# Patient Record
Sex: Male | Born: 1940 | Race: White | Hispanic: No | Marital: Married
Health system: Southern US, Community
[De-identification: ages and names within clinical notes are randomized; demographics above are authoritative.]

## PROBLEM LIST (undated history)

## (undated) DIAGNOSIS — J90 Pleural effusion, not elsewhere classified: Secondary | ICD-10-CM

## (undated) DIAGNOSIS — I482 Chronic atrial fibrillation, unspecified: Secondary | ICD-10-CM

## (undated) DIAGNOSIS — G9341 Metabolic encephalopathy: Secondary | ICD-10-CM

## (undated) DIAGNOSIS — J9621 Acute and chronic respiratory failure with hypoxia: Secondary | ICD-10-CM

## (undated) DIAGNOSIS — I5042 Chronic combined systolic (congestive) and diastolic (congestive) heart failure: Secondary | ICD-10-CM

## (undated) DIAGNOSIS — J449 Chronic obstructive pulmonary disease, unspecified: Secondary | ICD-10-CM

---

## 2020-08-31 ENCOUNTER — Other Ambulatory Visit (HOSPITAL_COMMUNITY): Payer: Medicare Other

## 2020-08-31 ENCOUNTER — Inpatient Hospital Stay
Admission: AD | Admit: 2020-08-31 | Discharge: 2020-11-18 | Disposition: E | Payer: Medicare Other | Source: Other Acute Inpatient Hospital | Attending: Internal Medicine | Admitting: Internal Medicine

## 2020-08-31 DIAGNOSIS — N179 Acute kidney failure, unspecified: Secondary | ICD-10-CM

## 2020-08-31 DIAGNOSIS — K567 Ileus, unspecified: Secondary | ICD-10-CM

## 2020-08-31 DIAGNOSIS — J869 Pyothorax without fistula: Secondary | ICD-10-CM

## 2020-08-31 DIAGNOSIS — J9690 Respiratory failure, unspecified, unspecified whether with hypoxia or hypercapnia: Secondary | ICD-10-CM

## 2020-08-31 DIAGNOSIS — J449 Chronic obstructive pulmonary disease, unspecified: Secondary | ICD-10-CM | POA: Diagnosis present

## 2020-08-31 DIAGNOSIS — Z452 Encounter for adjustment and management of vascular access device: Secondary | ICD-10-CM

## 2020-08-31 DIAGNOSIS — Z9889 Other specified postprocedural states: Secondary | ICD-10-CM

## 2020-08-31 DIAGNOSIS — J9621 Acute and chronic respiratory failure with hypoxia: Secondary | ICD-10-CM | POA: Diagnosis present

## 2020-08-31 DIAGNOSIS — I5042 Chronic combined systolic (congestive) and diastolic (congestive) heart failure: Secondary | ICD-10-CM | POA: Diagnosis present

## 2020-08-31 DIAGNOSIS — J90 Pleural effusion, not elsewhere classified: Secondary | ICD-10-CM

## 2020-08-31 DIAGNOSIS — Z931 Gastrostomy status: Secondary | ICD-10-CM

## 2020-08-31 DIAGNOSIS — T859XXA Unspecified complication of internal prosthetic device, implant and graft, initial encounter: Secondary | ICD-10-CM

## 2020-08-31 DIAGNOSIS — G9341 Metabolic encephalopathy: Secondary | ICD-10-CM | POA: Diagnosis present

## 2020-08-31 DIAGNOSIS — T8132XA Disruption of internal operation (surgical) wound, not elsewhere classified, initial encounter: Secondary | ICD-10-CM

## 2020-08-31 DIAGNOSIS — I482 Chronic atrial fibrillation, unspecified: Secondary | ICD-10-CM | POA: Diagnosis present

## 2020-08-31 HISTORY — DX: Chronic obstructive pulmonary disease, unspecified: J44.9

## 2020-08-31 HISTORY — DX: Chronic atrial fibrillation, unspecified: I48.20

## 2020-08-31 HISTORY — DX: Acute and chronic respiratory failure with hypoxia: J96.21

## 2020-08-31 HISTORY — DX: Metabolic encephalopathy: G93.41

## 2020-08-31 HISTORY — DX: Chronic combined systolic (congestive) and diastolic (congestive) heart failure: I50.42

## 2020-08-31 HISTORY — DX: Pleural effusion, not elsewhere classified: J90

## 2020-08-31 LAB — BLOOD GAS, ARTERIAL
Acid-Base Excess: 4.4 mmol/L — ABNORMAL HIGH (ref 0.0–2.0)
Bicarbonate: 28.7 mmol/L — ABNORMAL HIGH (ref 20.0–28.0)
FIO2: 28
O2 Saturation: 96.9 %
Patient temperature: 37
pCO2 arterial: 44.9 mmHg (ref 32.0–48.0)
pH, Arterial: 7.422 (ref 7.350–7.450)
pO2, Arterial: 78.5 mmHg — ABNORMAL LOW (ref 83.0–108.0)

## 2020-09-01 DIAGNOSIS — I5042 Chronic combined systolic (congestive) and diastolic (congestive) heart failure: Secondary | ICD-10-CM | POA: Diagnosis not present

## 2020-09-01 DIAGNOSIS — G9341 Metabolic encephalopathy: Secondary | ICD-10-CM

## 2020-09-01 DIAGNOSIS — I482 Chronic atrial fibrillation, unspecified: Secondary | ICD-10-CM

## 2020-09-01 DIAGNOSIS — J449 Chronic obstructive pulmonary disease, unspecified: Secondary | ICD-10-CM | POA: Diagnosis not present

## 2020-09-01 DIAGNOSIS — J9621 Acute and chronic respiratory failure with hypoxia: Secondary | ICD-10-CM

## 2020-09-01 LAB — COMPREHENSIVE METABOLIC PANEL
ALT: 44 U/L (ref 0–44)
AST: 45 U/L — ABNORMAL HIGH (ref 15–41)
Albumin: 2.1 g/dL — ABNORMAL LOW (ref 3.5–5.0)
Alkaline Phosphatase: 106 U/L (ref 38–126)
Anion gap: 8 (ref 5–15)
BUN: 76 mg/dL — ABNORMAL HIGH (ref 8–23)
CO2: 28 mmol/L (ref 22–32)
Calcium: 9.1 mg/dL (ref 8.9–10.3)
Chloride: 110 mmol/L (ref 98–111)
Creatinine, Ser: 1.91 mg/dL — ABNORMAL HIGH (ref 0.61–1.24)
GFR calc Af Amer: 39 mL/min — ABNORMAL LOW (ref >60)
GFR calc non Af Amer: 33 mL/min — ABNORMAL LOW (ref >60)
Glucose, Bld: 179 mg/dL — ABNORMAL HIGH (ref 70–99)
Potassium: 5.8 mmol/L — ABNORMAL HIGH (ref 3.5–5.1)
Sodium: 146 mmol/L — ABNORMAL HIGH (ref 135–145)
Total Bilirubin: 1.1 mg/dL (ref 0.3–1.2)
Total Protein: 6.4 g/dL — ABNORMAL LOW (ref 6.5–8.1)

## 2020-09-01 LAB — CBC
HCT: 25.1 % — ABNORMAL LOW (ref 39.0–52.0)
Hemoglobin: 7.6 g/dL — ABNORMAL LOW (ref 13.0–17.0)
MCH: 28.7 pg (ref 26.0–34.0)
MCHC: 30.3 g/dL (ref 30.0–36.0)
MCV: 94.7 fL (ref 80.0–100.0)
Platelets: 232 10*3/uL (ref 150–400)
RBC: 2.65 MIL/uL — ABNORMAL LOW (ref 4.22–5.81)
RDW: 15.4 % (ref 11.5–15.5)
WBC: 7.7 10*3/uL (ref 4.0–10.5)
nRBC: 0 % (ref 0.0–0.2)

## 2020-09-01 LAB — PROTIME-INR
INR: 1.7 — ABNORMAL HIGH (ref 0.8–1.2)
Prothrombin Time: 19.6 seconds — ABNORMAL HIGH (ref 11.4–15.2)

## 2020-09-01 LAB — SARS CORONAVIRUS 2 (TAT 6-24 HRS): SARS Coronavirus 2: NEGATIVE

## 2020-09-01 NOTE — Consult Note (Signed)
Pulmonary Critical Care Medicine [facility] [place]  PULMONARY SERVICE  Date of Service: 09/01/2020  PULMONARY CRITICAL CARE CONSULT   Eric Knapp  HDQ:[id number]  DOB: 1941/09/17   DOA: 09/01/2020  Referring Physician: Carron Curie, MD  HPI: Eric Knapp is a 79 y.o. male seen for follow up of Acute on Chronic Respiratory Failure.  Patient has multiple medical problems including hypertension hyperlipidemia coronary artery disease chronic diastolic heart failure COPD diabetes mellitus chronic kidney disease stage III came into the hospital because of gallbladder issues.  Patient was found to be significantly dehydrated and was started on fluids and antibiotics because of sepsis.  Patient underwent an ERCP had gallstones underwent sphincterotomy and then placed a bile duct stent.  Hospital course was complicated with worsening of symptoms encephalopathy and respiratory failure patient ended up being intubated on the ventilator.  During the hospital stay patient apparently developed cardiac arrest and required CPR with return of circulation.  Subsequently failed to come off of the ventilator ended up having to have a tracheostomy transferred to our facility for further management  Past medical history: Hyperlipidemia Hypertension Coronary artery disease Chronic diastolic heart failure COPD Cholecystitis Diabetes mellitus  Past surgical history: Tracheostomy PEG ERCP Bile duct stent placement  Social history: Unknown  Family history: Noncontributory  Allergies: No known drug allergies   Review of Systems:  ROS performed and is unremarkable other than noted above.   Medications: Reviewed on Rounds  Physical Exam:  Vitals: Temperature is 96.8 pulse 69 respiratory rate 30 blood pressure is 123/43 saturations 92%  Ventilator Settings mode of ventilation pressure support FiO2 28% tidal volume 442 pressure 12/5   General: Comfortable at this time  Eyes: Grossly  normal lids, irises & conjunctiva  ENT: grossly tongue is normal  Neck: no obvious mass  Cardiovascular: S1-S2 normal no gallop or rub  Respiratory: Few coarse breath sounds with rhonchi  Abdomen: Soft and nontender  Skin: no rash seen on limited exam  Musculoskeletal: not rigid  Psychiatric:unable to assess  Neurologic: no seizure no involuntary movements         Labs on Admission:  Basic Metabolic Panel: Recent Labs  Lab 09/01/20 0955  NA 146*  K 5.8*  CL 110  CO2 28  GLUCOSE 179*  BUN 76*  CREATININE 1.91*  CALCIUM 9.1    Recent Labs  Lab 2020/09/01 1812  PHART 7.422  PCO2ART 44.9  PO2ART 78.5*  HCO3 28.7*  O2SAT 96.9    Liver Function Tests: Recent Labs  Lab 09/01/20 0955  AST 45*  ALT 44  ALKPHOS 106  BILITOT 1.1  PROT 6.4*  ALBUMIN 2.1*   No results for input(s): LIPASE, AMYLASE in the last 168 hours. No results for input(s): AMMONIA in the last 168 hours.  CBC: Recent Labs  Lab 09/01/20 0955  WBC 7.7  HGB 7.6*  HCT 25.1*  MCV 94.7  PLT 232    Cardiac Enzymes: No results for input(s): CKTOTAL, CKMB, CKMBINDEX, TROPONINI in the last 168 hours.  BNP (last 3 results) No results for input(s): BNP in the last 8760 hours.  ProBNP (last 3 results) No results for input(s): PROBNP in the last 8760 hours.   Radiological Exams on Admission: DG ABDOMEN PEG TUBE LOCATION  Result Date: 09-01-2020 CLINICAL DATA:  Peg tube placement. EXAM: ABDOMEN - 1 VIEW COMPARISON:  None. FINDINGS: The bowel gas pattern is normal. Distal tip of PEG tube appears to be within the gastric lumen. Normal contrast filling of the  gastric lumen and proximal duodenum is noted. No extravasation or leakage is noted. No radio-opaque calculi or other significant radiographic abnormality are seen. Common bile duct stent is noted. Pigtail catheter is noted in right upper quadrant. IMPRESSION: Distal tip of PEG tube appears to be within the gastric lumen. No evidence of  extravasation or leakage. No evidence of bowel obstruction or ileus. Electronically Signed   By: Lupita Raider M.D.   On: 2020-09-10 19:02   DG CHEST PORT 1 VIEW  Result Date: 09-10-20 CLINICAL DATA:  Respiratory failure. EXAM: PORTABLE CHEST 1 VIEW COMPARISON:  None. FINDINGS: Mild cardiomegaly is noted. Tracheostomy tube is in good position. Large right pleural effusion is noted with aerated right upper lobe, but probable right lower lobe atelectasis. No pneumothorax is noted. Left lung is unremarkable. Bony thorax appears normal. IMPRESSION: Large right pleural effusion is noted with aerated right upper lobe, but probable right lower lobe atelectasis. Electronically Signed   By: Lupita Raider M.D.   On: 2020/09/10 19:01    Assessment/Plan Active Problems:   Acute on chronic respiratory failure with hypoxia (HCC)   Chronic combined systolic and diastolic heart failure (HCC)   COPD, severe (HCC)   Chronic atrial fibrillation (HCC)   Metabolic encephalopathy   1. Acute on chronic respiratory failure with hypoxia patient continues to be on the weaning protocol we will continue to try to advance as tolerated.  So far patient is doing well we will continue with supportive care.  Titrate oxygen continue pulmonary toilet. 2. Chronic heart failure combined systolic diastolic supportive care patient has been on beta-blockers 3. COPD nebulizers as necessary we will continue to follow. 4. Chronic atrial fibrillation rate controlled at this time we will continue to monitor closely. 5. Metabolic encephalopathy supportive care  I have personally seen and evaluated the patient, evaluated laboratory and imaging results, formulated the assessment and plan and placed orders. The Patient requires high complexity decision making with multiple systems involvement.  Case was discussed on Rounds with the Respiratory Therapy Director and the Respiratory staff Time Spent  Yevonne Pax, MD  [facility] Pulmonary Critical Care Medicine Sleep Medicine

## 2020-09-02 ENCOUNTER — Other Ambulatory Visit (HOSPITAL_COMMUNITY): Payer: Medicare Other

## 2020-09-02 DIAGNOSIS — J449 Chronic obstructive pulmonary disease, unspecified: Secondary | ICD-10-CM | POA: Diagnosis not present

## 2020-09-02 DIAGNOSIS — I482 Chronic atrial fibrillation, unspecified: Secondary | ICD-10-CM | POA: Diagnosis not present

## 2020-09-02 DIAGNOSIS — I5042 Chronic combined systolic (congestive) and diastolic (congestive) heart failure: Secondary | ICD-10-CM | POA: Diagnosis not present

## 2020-09-02 DIAGNOSIS — J9621 Acute and chronic respiratory failure with hypoxia: Secondary | ICD-10-CM | POA: Diagnosis not present

## 2020-09-02 HISTORY — PX: IR THORACENTESIS ASP PLEURAL SPACE W/IMG GUIDE: IMG5380

## 2020-09-02 MED ORDER — LIDOCAINE HCL (PF) 1 % IJ SOLN
INTRAMUSCULAR | Status: AC | PRN
  Administered 2020-09-02: 20 mL

## 2020-09-02 MED ORDER — LIDOCAINE HCL 1 % IJ SOLN
INTRAMUSCULAR | Status: AC
  Filled 2020-09-02: qty 20

## 2020-09-02 NOTE — Procedures (Signed)
PROCEDURE SUMMARY:  Successful image-guided right thoracentesis. Yielded 2.0 liters of dark red fluid. Procedure was stopped after 2.0 L per IR protocol as this is patient's first thoracentesis. Patient tolerated procedure well. No immediate complications. EBL < 5 mL.  Specimen was not sent for labs. CXR ordered.  Please see imaging section of Epic for full dictation.   Elwin Mocha PA-C 09/02/2020 3:33 PM

## 2020-09-02 NOTE — Progress Notes (Addendum)
Pulmonary Critical Care Medicine [facility] [place]   PULMONARY CRITICAL CARE SERVICE  PROGRESS NOTE  Date of Service: 09/02/2020  Eric Knapp  ZYS:[id number]  DOB: 18-Sep-1941   DOA: 08/30/2020  Referring Physician: Carron Curie, MD  HPI: Eric Knapp is a 79 y.o. male seen for follow up of Acute on Chronic Respiratory Failure.   Patient has an 8 hour goal today on pressure support currently on 28% FiO2 satting well no fever distress.  Medications: Reviewed on Rounds  Physical Exam:  Vitals:   Pulse 97 respirations 30 BP 113/62 O2 sat 98% temp 97.2 degrees  Ventilator Settings  Pressure support 12/5 FiO2 28%  . General: Comfortable at this time . Eyes: Grossly normal lids, irises & conjunctiva . ENT: grossly tongue is normal . Neck: no obvious mass . Cardiovascular: S1 S2 normal no gallop . Respiratory:  Coarse breath sounds . Abdomen: soft . Skin: no rash seen on limited exam . Musculoskeletal: not rigid . Psychiatric:unable to assess . Neurologic: no seizure no involuntary movements         Lab Data:   Basic Metabolic Panel: Recent Labs  Lab 09/01/20 0955  NA 146*  K 5.8*  CL 110  CO2 28  GLUCOSE 179*  BUN 76*  CREATININE 1.91*  CALCIUM 9.1    ABG: Recent Labs  Lab 08/28/2020 1812  PHART 7.422  PCO2ART 44.9  PO2ART 78.5*  HCO3 28.7*  O2SAT 96.9    Liver Function Tests: Recent Labs  Lab 09/01/20 0955  AST 45*  ALT 44  ALKPHOS 106  BILITOT 1.1  PROT 6.4*  ALBUMIN 2.1*   No results for input(s): LIPASE, AMYLASE in the last 168 hours. No results for input(s): AMMONIA in the last 168 hours.  CBC: Recent Labs  Lab 09/01/20 0955  WBC 7.7  HGB 7.6*  HCT 25.1*  MCV 94.7  PLT 232    Cardiac Enzymes: No results for input(s): CKTOTAL, CKMB, CKMBINDEX, TROPONINI in the last 168 hours.  BNP (last 3 results) No results for input(s): BNP in the last 8760 hours.  ProBNP (last 3 results) No results for input(s): PROBNP in the last  8760 hours.  Radiological Exams: DG Chest Port 1 View  Result Date: 09/02/2020 CLINICAL DATA:  Status post right thoracentesis yielding 2 L EXAM: PORTABLE CHEST 1 VIEW COMPARISON:  Chest x-ray 08/21/2020 FINDINGS: Tracheostomy tube with tip terminating approximately 6 cm above the carina. Persistent enlarged cardiac silhouette. Aortic arch calcifications. Interval decrease in size of a persistent moderate sized right pleural effusion. Improved aeration of the right lung with probable right lower lobe atelectasis. No focal consolidation identified. No pulmonary edema. No left pleural effusion. No focal consolidation within the left lung. No pneumothorax. No acute osseous abnormality. IMPRESSION: 1. Interval decrease in size of a moderate right pleural effusion with improved aeration of the right lung. Likely underlying atelectasis. Focal consolidation cannot be excluded. 2. Cardiomegaly. Electronically Signed   By: Tish Frederickson M.D.   On: 09/02/2020 16:16   IR THORACENTESIS ASP PLEURAL SPACE W/IMG GUIDE  Result Date: 09/02/2020 INDICATION: Patient with history of HF, CAD, anoxic brain injury, and bilateral pleural effusions, right > left. Request is made for therapeutic right thoracentesis. EXAM: ULTRASOUND GUIDED THERAPEUTIC RIGHT THORACENTESIS MEDICATIONS: 18 mL 1% lidocaine COMPLICATIONS: None immediate. PROCEDURE: An ultrasound guided thoracentesis was thoroughly discussed with the patient and questions answered. The benefits, risks, alternatives and complications were also discussed. The patient understands and wishes to proceed with the procedure. Written  consent was obtained. Ultrasound was performed to localize and mark an adequate pocket of fluid in the right chest. The area was then prepped and draped in the normal sterile fashion. 1% Lidocaine was used for local anesthesia. Under ultrasound guidance a 6 Fr Safe-T-Centesis catheter was introduced. Thoracentesis was performed. The catheter was  removed and a dressing applied. FINDINGS: A total of approximately 2 L of dark red fluid was removed. Procedure was stopped after 2 L per IR protocol as this was patient's first thoracentesis. IMPRESSION: Successful ultrasound guided right thoracentesis yielding 2 L of pleural fluid. Read by: Elwin Mocha, PA-C Electronically Signed   By: Marliss Coots MD   On: 09/02/2020 16:26    Assessment/Plan Active Problems:   Acute on chronic respiratory failure with hypoxia (HCC)   Chronic combined systolic and diastolic heart failure (HCC)   COPD, severe (HCC)   Chronic atrial fibrillation (HCC)   Metabolic encephalopathy   1. Acute on chronic respiratory failure with hypoxia  Continue to wean for goal of 8 hours on pressure support today will continue supportive measures and pulmonary toilet. 2. Chronic heart failure combined systolic diastolic supportive care patient has been on beta-blockers 3. COPD nebulizers as necessary we will continue to follow. 4. Chronic atrial fibrillation rate controlled at this time we will continue to monitor closely. 5. Metabolic encephalopathy supportive care   I have personally seen and evaluated the patient, evaluated laboratory and imaging results, formulated the assessment and plan and placed orders. The Patient requires high complexity decision making with multiple systems involvement.  Rounds were done with the Respiratory Therapy Director and Staff therapists and discussed with nursing staff also.  Yevonne Pax, MD [facility] Pulmonary Critical Care Medicine Sleep Medicine

## 2020-09-03 ENCOUNTER — Encounter: Payer: Self-pay | Admitting: Internal Medicine

## 2020-09-03 DIAGNOSIS — I482 Chronic atrial fibrillation, unspecified: Secondary | ICD-10-CM | POA: Diagnosis present

## 2020-09-03 DIAGNOSIS — G9341 Metabolic encephalopathy: Secondary | ICD-10-CM | POA: Diagnosis present

## 2020-09-03 DIAGNOSIS — I5042 Chronic combined systolic (congestive) and diastolic (congestive) heart failure: Secondary | ICD-10-CM | POA: Diagnosis present

## 2020-09-03 DIAGNOSIS — J9621 Acute and chronic respiratory failure with hypoxia: Secondary | ICD-10-CM | POA: Diagnosis present

## 2020-09-03 DIAGNOSIS — J449 Chronic obstructive pulmonary disease, unspecified: Secondary | ICD-10-CM | POA: Diagnosis not present

## 2020-09-03 NOTE — Progress Notes (Addendum)
Pulmonary Critical Care Medicine [facility] [place]   PULMONARY CRITICAL CARE SERVICE  PROGRESS NOTE  Date of Service: 09/03/2020  Eric Knapp  UEA:[id number]  DOB: 1941/10/20   DOA: 09-10-20  Referring Physician: Carron Curie, MD  HPI: Eric Knapp is a 79 y.o. male seen for follow up of Acute on Chronic Respiratory Failure.   Patient continues on pressure support with FiO2 28% currently satting well no distress.  Medications: Reviewed on Rounds  Physical Exam:  Vitals:   Pulse 82 respirations 19 BP 120/69 O2 sat 100% temp 96.0 degrees  Ventilator Settings  Pressure support 12/5 FiO2 28%  . General: Comfortable at this time . Eyes: Grossly normal lids, irises & conjunctiva . ENT: grossly tongue is normal . Neck: no obvious mass . Cardiovascular: S1 S2 normal no gallop . Respiratory:  Coarse breath sounds . Abdomen: soft . Skin: no rash seen on limited exam . Musculoskeletal: not rigid . Psychiatric:unable to assess . Neurologic: no seizure no involuntary movements         Lab Data:   Basic Metabolic Panel: Recent Labs  Lab 09/01/20 0955  NA 146*  K 5.8*  CL 110  CO2 28  GLUCOSE 179*  BUN 76*  CREATININE 1.91*  CALCIUM 9.1    ABG: Recent Labs  Lab 09-10-2020 1812  PHART 7.422  PCO2ART 44.9  PO2ART 78.5*  HCO3 28.7*  O2SAT 96.9    Liver Function Tests: Recent Labs  Lab 09/01/20 0955  AST 45*  ALT 44  ALKPHOS 106  BILITOT 1.1  PROT 6.4*  ALBUMIN 2.1*   No results for input(s): LIPASE, AMYLASE in the last 168 hours. No results for input(s): AMMONIA in the last 168 hours.  CBC: Recent Labs  Lab 09/01/20 0955  WBC 7.7  HGB 7.6*  HCT 25.1*  MCV 94.7  PLT 232    Cardiac Enzymes: No results for input(s): CKTOTAL, CKMB, CKMBINDEX, TROPONINI in the last 168 hours.  BNP (last 3 results) No results for input(s): BNP in the last 8760 hours.  ProBNP (last 3 results) No results for input(s): PROBNP in the last 8760  hours.  Radiological Exams: DG Chest Port 1 View  Result Date: 09/02/2020 CLINICAL DATA:  Status post right thoracentesis yielding 2 L EXAM: PORTABLE CHEST 1 VIEW COMPARISON:  Chest x-ray 09-10-20 FINDINGS: Tracheostomy tube with tip terminating approximately 6 cm above the carina. Persistent enlarged cardiac silhouette. Aortic arch calcifications. Interval decrease in size of a persistent moderate sized right pleural effusion. Improved aeration of the right lung with probable right lower lobe atelectasis. No focal consolidation identified. No pulmonary edema. No left pleural effusion. No focal consolidation within the left lung. No pneumothorax. No acute osseous abnormality. IMPRESSION: 1. Interval decrease in size of a moderate right pleural effusion with improved aeration of the right lung. Likely underlying atelectasis. Focal consolidation cannot be excluded. 2. Cardiomegaly. Electronically Signed   By: Tish Frederickson M.D.   On: 09/02/2020 16:16   IR THORACENTESIS ASP PLEURAL SPACE W/IMG GUIDE  Result Date: 09/02/2020 INDICATION: Patient with history of HF, CAD, anoxic brain injury, and bilateral pleural effusions, right > left. Request is made for therapeutic right thoracentesis. EXAM: ULTRASOUND GUIDED THERAPEUTIC RIGHT THORACENTESIS MEDICATIONS: 18 mL 1% lidocaine COMPLICATIONS: None immediate. PROCEDURE: An ultrasound guided thoracentesis was thoroughly discussed with the patient and questions answered. The benefits, risks, alternatives and complications were also discussed. The patient understands and wishes to proceed with the procedure. Written consent was obtained. Ultrasound was performed  to localize and mark an adequate pocket of fluid in the right chest. The area was then prepped and draped in the normal sterile fashion. 1% Lidocaine was used for local anesthesia. Under ultrasound guidance a 6 Fr Safe-T-Centesis catheter was introduced. Thoracentesis was performed. The catheter was removed  and a dressing applied. FINDINGS: A total of approximately 2 L of dark red fluid was removed. Procedure was stopped after 2 L per IR protocol as this was patient's first thoracentesis. IMPRESSION: Successful ultrasound guided right thoracentesis yielding 2 L of pleural fluid. Read by: Elwin Mocha, PA-C Electronically Signed   By: Marliss Coots MD   On: 09/02/2020 16:26    Assessment/Plan Active Problems:   Acute on chronic respiratory failure with hypoxia (HCC)   Chronic combined systolic and diastolic heart failure (HCC)   COPD, severe (HCC)   Chronic atrial fibrillation (HCC)   Metabolic encephalopathy   1. Acute on chronic respiratory failure with hypoxia  Continue wean on pressure support at this time.  Continue supportive measures and pulmonary toilet. 2. Chronic heart failure combined systolic diastolic supportive care patient has been on beta-blockers 3. COPD nebulizers as necessary we will continue to follow. 4. Chronic atrial fibrillation rate controlled at this time we will continue to monitor closely. 5. Metabolic encephalopathy supportive care   I have personally seen and evaluated the patient, evaluated laboratory and imaging results, formulated the assessment and plan and placed orders. The Patient requires high complexity decision making with multiple systems involvement.  Rounds were done with the Respiratory Therapy Director and Staff therapists and discussed with nursing staff also.  Yevonne Pax, MD [facility] Pulmonary Critical Care Medicine Sleep Medicine

## 2020-09-04 DIAGNOSIS — I5042 Chronic combined systolic (congestive) and diastolic (congestive) heart failure: Secondary | ICD-10-CM | POA: Diagnosis not present

## 2020-09-04 DIAGNOSIS — J449 Chronic obstructive pulmonary disease, unspecified: Secondary | ICD-10-CM | POA: Diagnosis not present

## 2020-09-04 DIAGNOSIS — I482 Chronic atrial fibrillation, unspecified: Secondary | ICD-10-CM | POA: Diagnosis not present

## 2020-09-04 DIAGNOSIS — J9621 Acute and chronic respiratory failure with hypoxia: Secondary | ICD-10-CM | POA: Diagnosis not present

## 2020-09-04 LAB — CBC
HCT: 22.7 % — ABNORMAL LOW (ref 39.0–52.0)
HCT: 26.8 % — ABNORMAL LOW (ref 39.0–52.0)
Hemoglobin: 6.5 g/dL — CL (ref 13.0–17.0)
Hemoglobin: 8.2 g/dL — ABNORMAL LOW (ref 13.0–17.0)
MCH: 27.1 pg (ref 26.0–34.0)
MCH: 28.9 pg (ref 26.0–34.0)
MCHC: 28.6 g/dL — ABNORMAL LOW (ref 30.0–36.0)
MCHC: 30.6 g/dL (ref 30.0–36.0)
MCV: 94.6 fL (ref 80.0–100.0)
MCV: 94.4 fL (ref 80.0–100.0)
Platelets: 200 10*3/uL (ref 150–400)
Platelets: 221 10*3/uL (ref 150–400)
RBC: 2.4 MIL/uL — ABNORMAL LOW (ref 4.22–5.81)
RBC: 2.84 MIL/uL — ABNORMAL LOW (ref 4.22–5.81)
RDW: 15.4 % (ref 11.5–15.5)
RDW: 15.1 % (ref 11.5–15.5)
WBC: 6.9 10*3/uL (ref 4.0–10.5)
WBC: 6.7 10*3/uL (ref 4.0–10.5)
nRBC: 0 % (ref 0.0–0.2)
nRBC: 0 % (ref 0.0–0.2)

## 2020-09-04 LAB — BASIC METABOLIC PANEL
Anion gap: 3 — ABNORMAL LOW (ref 5–15)
Anion gap: 7 (ref 5–15)
BUN: 107 mg/dL — ABNORMAL HIGH (ref 8–23)
BUN: 106 mg/dL — ABNORMAL HIGH (ref 8–23)
CO2: 29 mmol/L (ref 22–32)
CO2: 28 mmol/L (ref 22–32)
Calcium: 8.6 mg/dL — ABNORMAL LOW (ref 8.9–10.3)
Calcium: 8.8 mg/dL — ABNORMAL LOW (ref 8.9–10.3)
Chloride: 113 mmol/L — ABNORMAL HIGH (ref 98–111)
Chloride: 113 mmol/L — ABNORMAL HIGH (ref 98–111)
Creatinine, Ser: 2.07 mg/dL — ABNORMAL HIGH (ref 0.61–1.24)
Creatinine, Ser: 1.97 mg/dL — ABNORMAL HIGH (ref 0.61–1.24)
GFR calc Af Amer: 35 mL/min — ABNORMAL LOW (ref >60)
GFR calc Af Amer: 37 mL/min — ABNORMAL LOW (ref >60)
GFR calc non Af Amer: 30 mL/min — ABNORMAL LOW (ref >60)
GFR calc non Af Amer: 32 mL/min — ABNORMAL LOW (ref >60)
Glucose, Bld: 114 mg/dL — ABNORMAL HIGH (ref 70–99)
Glucose, Bld: 157 mg/dL — ABNORMAL HIGH (ref 70–99)
Potassium: 5.4 mmol/L — ABNORMAL HIGH (ref 3.5–5.1)
Potassium: 5.4 mmol/L — ABNORMAL HIGH (ref 3.5–5.1)
Sodium: 145 mmol/L (ref 135–145)
Sodium: 148 mmol/L — ABNORMAL HIGH (ref 135–145)

## 2020-09-04 LAB — ABO/RH: ABO/RH(D): O POS

## 2020-09-04 LAB — PREPARE RBC (CROSSMATCH)

## 2020-09-04 LAB — BRAIN NATRIURETIC PEPTIDE: B Natriuretic Peptide: 389.9 pg/mL — ABNORMAL HIGH (ref 0.0–100.0)

## 2020-09-04 NOTE — Progress Notes (Addendum)
Pulmonary Critical Care Medicine [facility] [place]   PULMONARY CRITICAL CARE SERVICE  PROGRESS NOTE  Date of Service: 09/04/2020  Eric Knapp  IEP:[id number]  DOB: [birth year]   DOA: 09/06/2020  Referring Physician: Carron Curie, MD  HPI: Eric Knapp is a 79 y.o. male seen for follow up of Acute on Chronic Respiratory Failure.  Patient mains on pressure support at this time currently on 20% FiO2 satting well no fever distress.  Medications: Reviewed on Rounds  Physical Exam:  Vitals: Pulse 87 respirations 16 BP 116/56 O2 sat 100% temp 97.1  Ventilator Settings pressure support 12/5 FiO2 28%  . General: Comfortable at this time . Eyes: Grossly normal lids, irises & conjunctiva . ENT: grossly tongue is normal . Neck: no obvious mass . Cardiovascular: S1 S2 normal no gallop . Respiratory: Coarse breath sounds . Abdomen: soft . Skin: no rash seen on limited exam . Musculoskeletal: not rigid . Psychiatric:unable to assess . Neurologic: no seizure no involuntary movements         Lab Data:   Basic Metabolic Panel: Recent Labs  Lab 09/01/20 0955 09/04/20 0632  NA 146* 145  K 5.8* 5.4*  CL 110 113*  CO2 28 29  GLUCOSE 179* 114*  BUN 76* 107*  CREATININE 1.91* 2.07*  CALCIUM 9.1 8.6*    ABG: Recent Labs  Lab 09/06/2020 1812  PHART 7.422  PCO2ART 44.9  PO2ART 78.5*  HCO3 28.7*  O2SAT 96.9    Liver Function Tests: Recent Labs  Lab 09/01/20 0955  AST 45*  ALT 44  ALKPHOS 106  BILITOT 1.1  PROT 6.4*  ALBUMIN 2.1*   No results for input(s): LIPASE, AMYLASE in the last 168 hours. No results for input(s): AMMONIA in the last 168 hours.  CBC: Recent Labs  Lab 09/01/20 0955 09/04/20 0632  WBC 7.7 6.9  HGB 7.6* 6.5*  HCT 25.1* 22.7*  MCV 94.7 94.6  PLT 232 200    Cardiac Enzymes: No results for input(s): CKTOTAL, CKMB, CKMBINDEX, TROPONINI in the last 168 hours.  BNP (last 3 results) No results for input(s): BNP in the last 8760  hours.  ProBNP (last 3 results) No results for input(s): PROBNP in the last 8760 hours.  Radiological Exams: DG Chest Port 1 View  Result Date: 09/02/2020 CLINICAL DATA:  Status post right thoracentesis yielding 2 L EXAM: PORTABLE CHEST 1 VIEW COMPARISON:  Chest x-ray 09/17/2020 FINDINGS: Tracheostomy tube with tip terminating approximately 6 cm above the carina. Persistent enlarged cardiac silhouette. Aortic arch calcifications. Interval decrease in size of a persistent moderate sized right pleural effusion. Improved aeration of the right lung with probable right lower lobe atelectasis. No focal consolidation identified. No pulmonary edema. No left pleural effusion. No focal consolidation within the left lung. No pneumothorax. No acute osseous abnormality. IMPRESSION: 1. Interval decrease in size of a moderate right pleural effusion with improved aeration of the right lung. Likely underlying atelectasis. Focal consolidation cannot be excluded. 2. Cardiomegaly. Electronically Signed   By: Tish Frederickson M.D.   On: 09/02/2020 16:16   IR THORACENTESIS ASP PLEURAL SPACE W/IMG GUIDE  Result Date: 09/02/2020 INDICATION: Patient with history of HF, CAD, anoxic brain injury, and bilateral pleural effusions, right > left. Request is made for therapeutic right thoracentesis. EXAM: ULTRASOUND GUIDED THERAPEUTIC RIGHT THORACENTESIS MEDICATIONS: 18 mL 1% lidocaine COMPLICATIONS: None immediate. PROCEDURE: An ultrasound guided thoracentesis was thoroughly discussed with the patient and questions answered. The benefits, risks, alternatives and complications were also discussed. The patient  understands and wishes to proceed with the procedure. Written consent was obtained. Ultrasound was performed to localize and mark an adequate pocket of fluid in the right chest. The area was then prepped and draped in the normal sterile fashion. 1% Lidocaine was used for local anesthesia. Under ultrasound guidance a 6 Fr  Safe-T-Centesis catheter was introduced. Thoracentesis was performed. The catheter was removed and a dressing applied. FINDINGS: A total of approximately 2 L of dark red fluid was removed. Procedure was stopped after 2 L per IR protocol as this was patient's first thoracentesis. IMPRESSION: Successful ultrasound guided right thoracentesis yielding 2 L of pleural fluid. Read by: Elwin Mocha, PA-C Electronically Signed   By: Marliss Coots MD   On: 09/02/2020 16:26    Assessment/Plan Active Problems:   Acute on chronic respiratory failure with hypoxia (HCC)   Chronic combined systolic and diastolic heart failure (HCC)   COPD, severe (HCC)   Chronic atrial fibrillation (HCC)   Metabolic encephalopathy   1. Acute on chronic respiratory failure with hypoxia  Continue wean on pressure support at this time.  Continue supportive measures and pulmonary toilet. 2. Chronic heart failure combined systolic diastolic supportive care patient has been on beta-blockers 3. COPD nebulizers as necessary we will continue to follow. 4. Chronic atrial fibrillation rate controlled at this time we will continue to monitor closely. 5. Metabolic encephalopathy supportive care   I have personally seen and evaluated the patient, evaluated laboratory and imaging results, formulated the assessment and plan and placed orders. The Patient requires high complexity decision making with multiple systems involvement.  Rounds were done with the Respiratory Therapy Director and Staff therapists and discussed with nursing staff also.  Yevonne Pax, MD [facility] Pulmonary Critical Care Medicine Sleep Medicine

## 2020-09-05 ENCOUNTER — Other Ambulatory Visit (HOSPITAL_COMMUNITY): Payer: Medicare Other

## 2020-09-05 DIAGNOSIS — J449 Chronic obstructive pulmonary disease, unspecified: Secondary | ICD-10-CM | POA: Diagnosis not present

## 2020-09-05 DIAGNOSIS — J9621 Acute and chronic respiratory failure with hypoxia: Secondary | ICD-10-CM | POA: Diagnosis not present

## 2020-09-05 DIAGNOSIS — I482 Chronic atrial fibrillation, unspecified: Secondary | ICD-10-CM | POA: Diagnosis not present

## 2020-09-05 DIAGNOSIS — I5042 Chronic combined systolic (congestive) and diastolic (congestive) heart failure: Secondary | ICD-10-CM | POA: Diagnosis not present

## 2020-09-05 LAB — URINALYSIS, ROUTINE W REFLEX MICROSCOPIC
Bilirubin Urine: NEGATIVE
Glucose, UA: NEGATIVE mg/dL
Ketones, ur: NEGATIVE mg/dL
Nitrite: NEGATIVE
Protein, ur: 100 mg/dL — AB
RBC / HPF: >50 RBC/hpf — ABNORMAL HIGH (ref 0–5)
Specific Gravity, Urine: 1.012 (ref 1.005–1.030)
WBC, UA: >50 WBC/hpf — ABNORMAL HIGH (ref 0–5)
pH: 5 (ref 5.0–8.0)

## 2020-09-05 LAB — CBC
HCT: 29.7 % — ABNORMAL LOW (ref 39.0–52.0)
Hemoglobin: 9 g/dL — ABNORMAL LOW (ref 13.0–17.0)
MCH: 28.4 pg (ref 26.0–34.0)
MCHC: 30.3 g/dL (ref 30.0–36.0)
MCV: 93.7 fL (ref 80.0–100.0)
Platelets: 245 10*3/uL (ref 150–400)
RBC: 3.17 MIL/uL — ABNORMAL LOW (ref 4.22–5.81)
RDW: 15.4 % (ref 11.5–15.5)
WBC: 9.6 10*3/uL (ref 4.0–10.5)
nRBC: 0 % (ref 0.0–0.2)

## 2020-09-05 LAB — BASIC METABOLIC PANEL
Anion gap: 8 (ref 5–15)
Anion gap: 11 (ref 5–15)
BUN: 109 mg/dL — ABNORMAL HIGH (ref 8–23)
BUN: 117 mg/dL — ABNORMAL HIGH (ref 8–23)
CO2: 27 mmol/L (ref 22–32)
CO2: 26 mmol/L (ref 22–32)
Calcium: 8.6 mg/dL — ABNORMAL LOW (ref 8.9–10.3)
Calcium: 8.5 mg/dL — ABNORMAL LOW (ref 8.9–10.3)
Chloride: 113 mmol/L — ABNORMAL HIGH (ref 98–111)
Chloride: 111 mmol/L (ref 98–111)
Creatinine, Ser: 2.06 mg/dL — ABNORMAL HIGH (ref 0.61–1.24)
Creatinine, Ser: 2.33 mg/dL — ABNORMAL HIGH (ref 0.61–1.24)
GFR calc Af Amer: 35 mL/min — ABNORMAL LOW (ref >60)
GFR calc Af Amer: 30 mL/min — ABNORMAL LOW (ref >60)
GFR calc non Af Amer: 30 mL/min — ABNORMAL LOW (ref >60)
GFR calc non Af Amer: 26 mL/min — ABNORMAL LOW (ref >60)
Glucose, Bld: 197 mg/dL — ABNORMAL HIGH (ref 70–99)
Glucose, Bld: 155 mg/dL — ABNORMAL HIGH (ref 70–99)
Potassium: 6.1 mmol/L — ABNORMAL HIGH (ref 3.5–5.1)
Potassium: 5.2 mmol/L — ABNORMAL HIGH (ref 3.5–5.1)
Sodium: 148 mmol/L — ABNORMAL HIGH (ref 135–145)
Sodium: 148 mmol/L — ABNORMAL HIGH (ref 135–145)

## 2020-09-05 LAB — BLOOD GAS, ARTERIAL
Acid-Base Excess: 4.4 mmol/L — ABNORMAL HIGH (ref 0.0–2.0)
Bicarbonate: 28 mmol/L (ref 20.0–28.0)
FIO2: 28
O2 Saturation: 93.9 %
Patient temperature: 37
pCO2 arterial: 38.6 mmHg (ref 32.0–48.0)
pH, Arterial: 7.474 — ABNORMAL HIGH (ref 7.350–7.450)
pO2, Arterial: 64.9 mmHg — ABNORMAL LOW (ref 83.0–108.0)

## 2020-09-05 LAB — MAGNESIUM: Magnesium: 1.9 mg/dL (ref 1.7–2.4)

## 2020-09-05 LAB — BRAIN NATRIURETIC PEPTIDE: B Natriuretic Peptide: 458.9 pg/mL — ABNORMAL HIGH (ref 0.0–100.0)

## 2020-09-05 LAB — TROPONIN I (HIGH SENSITIVITY): Troponin I (High Sensitivity): 21 ng/L — ABNORMAL HIGH (ref <18)

## 2020-09-05 NOTE — Progress Notes (Addendum)
Pulmonary Critical Care Medicine [facility] [place]   PULMONARY CRITICAL CARE SERVICE  PROGRESS NOTE  Date of Service: 09/05/2020  Eric Knapp  DZH:[id number]  DOB: May 10, 1941   DOA: 2020-09-17  Referring Physician: Carron Curie, MD  HPI: Eric Knapp is a 79 y.o. male seen for follow up of Acute on Chronic Respiratory Failure.  Patient mains on full support assist-control mode rate of 10 with an FiO2 of 50% at this time satting well no fever distress.   Medications: Reviewed on Rounds  Physical Exam:  Vitals: Pulse 97 respiration 24 BP 137/73 O2 sat 98% temp 97.9   Ventilator Settings ventilator mode AC PC rate of 10 expiratory pressure 25 PEEP of 5 and FiO2 50%  . General: Comfortable at this time . Eyes: Grossly normal lids, irises & conjunctiva . ENT: grossly tongue is normal . Neck: no obvious mass . Cardiovascular: S1 S2 normal no gallop . Respiratory: Coarse breath sounds . Abdomen: soft . Skin: no rash seen on limited exam . Musculoskeletal: not rigid . Psychiatric:unable to assess . Neurologic: no seizure no involuntary movements         Lab Data:   Basic Metabolic Panel: Recent Labs  Lab 09/01/20 0955 09/04/20 0632 09/04/20 1646 09/05/20 1007  NA 146* 145 148* 148*  K 5.8* 5.4* 5.4* 6.1*  CL 110 113* 113* 113*  CO2 28 29 28 27   GLUCOSE 179* 114* 157* 197*  BUN 76* 107* 106* 109*  CREATININE 1.91* 2.07* 1.97* 2.06*  CALCIUM 9.1 8.6* 8.8* 8.6*  MG  --   --   --  1.9    ABG: Recent Labs  Lab 2020/09/17 1812 09/05/20 0750  PHART 7.422 7.474*  PCO2ART 44.9 38.6  PO2ART 78.5* 64.9*  HCO3 28.7* 28.0  O2SAT 96.9 93.9    Liver Function Tests: Recent Labs  Lab 09/01/20 0955  AST 45*  ALT 44  ALKPHOS 106  BILITOT 1.1  PROT 6.4*  ALBUMIN 2.1*   No results for input(s): LIPASE, AMYLASE in the last 168 hours. No results for input(s): AMMONIA in the last 168 hours.  CBC: Recent Labs  Lab 09/01/20 0955 09/04/20 0632 09/04/20 1646  09/05/20 0717  WBC 7.7 6.9 6.7 9.6  HGB 7.6* 6.5* 8.2* 9.0*  HCT 25.1* 22.7* 26.8* 29.7*  MCV 94.7 94.6 94.4 93.7  PLT 232 200 221 245    Cardiac Enzymes: No results for input(s): CKTOTAL, CKMB, CKMBINDEX, TROPONINI in the last 168 hours.  BNP (last 3 results) Recent Labs    09/04/20 1646 09/05/20 1007  BNP 389.9* 458.9*    ProBNP (last 3 results) No results for input(s): PROBNP in the last 8760 hours.  Radiological Exams: DG Chest Port 1 View  Result Date: 09/05/2020 CLINICAL DATA:  Evaluate tracheostomy tube position. EXAM: PORTABLE CHEST 1 VIEW COMPARISON:  09/02/2020 FINDINGS: The tracheostomy tube tip is stable above the carina. Unchanged cardiomediastinal contours. Moderate right pleural effusion is identified. Unchanged veil like opacification of the entire right lung. Left lung appears clear. IMPRESSION: 1. Stable tracheostomy tube position. 2. Persistent right pleural effusion and with veil like opacification of the entire right lung. Electronically Signed   By: 09/04/2020 M.D.   On: 09/05/2020 08:56    Assessment/Plan Active Problems:   Acute on chronic respiratory failure with hypoxia (HCC)   Chronic combined systolic and diastolic heart failure (HCC)   COPD, severe (HCC)   Chronic atrial fibrillation (HCC)   Metabolic encephalopathy   1. Acute on chronic  respiratory failure with hypoxiapatient continue on full support today currently on 50% FiO2 unable to wean.  We will continue supportive measures and continue to attempt weaning as tolerated. 2. Chronic heart failure combined systolic diastolic supportive care patient has been on beta-blockers 3. COPD nebulizers as necessary we will continue to follow. 4. Chronic atrial fibrillation rate controlled at this time we will continue to monitor closely. 5. Metabolic encephalopathy supportive care   I have personally seen and evaluated the patient, evaluated laboratory and imaging results, formulated the  assessment and plan and placed orders. The Patient requires high complexity decision making with multiple systems involvement.  Rounds were done with the Respiratory Therapy Director and Staff therapists and discussed with nursing staff also.  Yevonne Pax, MD [facility] Pulmonary Critical Care Medicine Sleep Medicine

## 2020-09-06 ENCOUNTER — Encounter: Payer: Self-pay | Admitting: Internal Medicine

## 2020-09-06 ENCOUNTER — Other Ambulatory Visit (HOSPITAL_COMMUNITY): Payer: Medicare Other

## 2020-09-06 DIAGNOSIS — I5042 Chronic combined systolic (congestive) and diastolic (congestive) heart failure: Secondary | ICD-10-CM | POA: Diagnosis not present

## 2020-09-06 DIAGNOSIS — J90 Pleural effusion, not elsewhere classified: Secondary | ICD-10-CM | POA: Diagnosis present

## 2020-09-06 DIAGNOSIS — I482 Chronic atrial fibrillation, unspecified: Secondary | ICD-10-CM | POA: Diagnosis not present

## 2020-09-06 DIAGNOSIS — J449 Chronic obstructive pulmonary disease, unspecified: Secondary | ICD-10-CM | POA: Diagnosis not present

## 2020-09-06 DIAGNOSIS — J9621 Acute and chronic respiratory failure with hypoxia: Secondary | ICD-10-CM | POA: Diagnosis not present

## 2020-09-06 LAB — URINE CULTURE: Culture: <10000 — AB

## 2020-09-06 LAB — BASIC METABOLIC PANEL
Anion gap: 12 (ref 5–15)
BUN: 123 mg/dL — ABNORMAL HIGH (ref 8–23)
CO2: 24 mmol/L (ref 22–32)
Calcium: 8.2 mg/dL — ABNORMAL LOW (ref 8.9–10.3)
Chloride: 108 mmol/L (ref 98–111)
Creatinine, Ser: 2.5 mg/dL — ABNORMAL HIGH (ref 0.61–1.24)
GFR calc Af Amer: 28 mL/min — ABNORMAL LOW (ref >60)
GFR calc non Af Amer: 24 mL/min — ABNORMAL LOW (ref >60)
Glucose, Bld: 271 mg/dL — ABNORMAL HIGH (ref 70–99)
Potassium: 5.3 mmol/L — ABNORMAL HIGH (ref 3.5–5.1)
Sodium: 144 mmol/L (ref 135–145)

## 2020-09-06 MED ORDER — LIDOCAINE HCL (PF) 1 % IJ SOLN
INTRAMUSCULAR | Status: AC
  Filled 2020-09-06: qty 30

## 2020-09-06 NOTE — Progress Notes (Signed)
IR consulted by Dr. Manson Passey for possible image-guided left chest tube placement for left empyema.  Case/images have been reviewed by Dr. Grace Isaac who approves procedure. Plan for image-guided left chest tube placement in IR tentatively for tomorrow 09/07/2020 pending IR scheduling. Patient will be NPO at midnight- please hold all tube feeds at this time as well. Patient got Lovenox SQ this AM- tomorrows dose held, ok to proceed after 11 tomorrow. Patient will be seen prior to procedure. IR to obtain consent from daughter 9/20. Dr. Manson Passey and [facility] RN aware of above.  Please call IR with questions/concerns.   Eric Boga Alesandra Smart, PA-C 09/06/2020, 12:51 PM

## 2020-09-06 NOTE — Progress Notes (Signed)
Pulmonary Critical Care Medicine [facility] [place]   PULMONARY CRITICAL CARE SERVICE  PROGRESS NOTE  Date of Service: 09/06/2020  Eric Knapp  YYT:[id number]  DOB: 1941/11/12   DOA: 09/05/2020  Referring Physician: Carron Curie, MD  HPI: Eric Knapp is a 79 y.o. male seen for follow up of Acute on Chronic Respiratory Failure.  Patient had a CT scan done yesterday shows large pleural effusion possible empyema patient had a thoracentesis attempted which was a dry tap.  Spoke with primary care team we might need to speak with thoracic surgery to see if there is requirement for chest tube  Medications: Reviewed on Rounds  Physical Exam:  Vitals: Temperature 97.3 pulse 93 respiratory rate 27 blood pressure is 138/71 saturations 100%  Ventilator Settings on pressure assist control IP 25 with a PEEP of 5 currently is on 50% FiO2  . General: Comfortable at this time . Eyes: Grossly normal lids, irises & conjunctiva . ENT: grossly tongue is normal . Neck: no obvious mass . Cardiovascular: S1 S2 normal no gallop . Respiratory: Diminished breath sounds on the left . Abdomen: soft . Skin: no rash seen on limited exam . Musculoskeletal: not rigid . Psychiatric:unable to assess . Neurologic: no seizure no involuntary movements         Lab Data:   Basic Metabolic Panel: Recent Labs  Lab 09/01/20 0955 09/04/20 0632 09/04/20 1646 09/05/20 1007 09/05/20 1616  NA 146* 145 148* 148* 148*  K 5.8* 5.4* 5.4* 6.1* 5.2*  CL 110 113* 113* 113* 111  CO2 28 29 28 27 26   GLUCOSE 179* 114* 157* 197* 155*  BUN 76* 107* 106* 109* 117*  CREATININE 1.91* 2.07* 1.97* 2.06* 2.33*  CALCIUM 9.1 8.6* 8.8* 8.6* 8.5*  MG  --   --   --  1.9  --     ABG: Recent Labs  Lab 2020-09-05 1812 09/05/20 0750  PHART 7.422 7.474*  PCO2ART 44.9 38.6  PO2ART 78.5* 64.9*  HCO3 28.7* 28.0  O2SAT 96.9 93.9    Liver Function Tests: Recent Labs  Lab 09/01/20 0955  AST 45*  ALT 44  ALKPHOS 106   BILITOT 1.1  PROT 6.4*  ALBUMIN 2.1*   No results for input(s): LIPASE, AMYLASE in the last 168 hours. No results for input(s): AMMONIA in the last 168 hours.  CBC: Recent Labs  Lab 09/01/20 0955 09/04/20 0632 09/04/20 1646 09/05/20 0717  WBC 7.7 6.9 6.7 9.6  HGB 7.6* 6.5* 8.2* 9.0*  HCT 25.1* 22.7* 26.8* 29.7*  MCV 94.7 94.6 94.4 93.7  PLT 232 200 221 245    Cardiac Enzymes: No results for input(s): CKTOTAL, CKMB, CKMBINDEX, TROPONINI in the last 168 hours.  BNP (last 3 results) Recent Labs    09/04/20 1646 09/05/20 1007  BNP 389.9* 458.9*    ProBNP (last 3 results) No results for input(s): PROBNP in the last 8760 hours.  Radiological Exams: CT ABDOMEN PELVIS WO CONTRAST  Result Date: 09/05/2020 CLINICAL DATA:  Empyema.  Abdominal pain and fever. EXAM: CT CHEST, ABDOMEN AND PELVIS WITHOUT CONTRAST TECHNIQUE: Multidetector CT imaging of the chest, abdomen and pelvis was performed following the standard protocol without IV contrast. COMPARISON:  None. FINDINGS: CT CHEST FINDINGS Cardiovascular: Heart is normal size. Calcified plaque is present over the left main and 3 vessel coronary arteries. Thoracic aorta is normal caliber. There is calcified plaque over the thoracic aorta. Remaining vascular structures are unremarkable. Mediastinum/Nodes: No significant mediastinal or hilar adenopathy. Remaining mediastinal structures are  unremarkable. Lungs/Pleura: Tracheostomy tube in adequate position. There is a large right pleural effusion with associated compressive atelectasis in the right base. There is a loculated component of pleural fluid over the anteromedial aspect of the right mid to upper thorax. 5 mm nodular density over the left upper lobe with possible rim calcification. Airways unremarkable. Musculoskeletal: Multiple old bilateral rib fractures. Degenerative change of the spine. CT ABDOMEN PELVIS FINDINGS Hepatobiliary: Gastrostomy tube in adequate position. Pigtail  drainage catheter over the right upper quadrant adjacent the border of the right lobe of the liver. There is a small amount of fluid and air adjacent the pigtail catheter tip which may represent the gallbladder. Common bile duct stent in adequate position. Minimal pneumobilia over the left lobe of the liver. Pancreas: Normal. Spleen: Normal. Adrenals/Urinary Tract: Adrenal glands are normal. Kidneys at the lower limits of normal in size. No hydronephrosis, nephrolithiasis or focal mass. Ureters are normal. Foley catheter is present within a decompressed bladder. Stomach/Bowel: The stomach is normal. Small bowel is normal. Appendix is not visualized. Colon is normal. Vascular/Lymphatic: Moderate calcified plaque over the abdominal aorta which is normal in caliber. No adenopathy. Reproductive: No focal abnormality. Other: Minimal free fluid over the left pericolic gutter and pelvis. No free peritoneal air or focal inflammatory change. Mild diffuse subcutaneous edema. Musculoskeletal: Moderate degenerative changes of the spine and hips. IMPRESSION: 1. Large right pleural effusion with associated compressive atelectasis in the right base. Loculated component of pleural fluid over the anteromedial aspect of the right mid to upper thorax. Difficult to assess for empyema due to lack of IV contrast. 2. Pigtail drainage catheter over the right upper quadrant adjacent the border of the right lobe of the liver with small amount of fluid and air adjacent the pigtail catheter tip which may represent the gallbladder. Recommend clinical correlation. Common bile duct stent in adequate position. Minimal pneumobilia over the left lobe of the liver. 3. Aortic atherosclerosis. Atherosclerotic coronary artery disease. 4. 5 mm nodule over the left upper lobe. No follow-up needed if patient is low-risk. Non-contrast chest CT can be considered in 12 months if patient is high-risk. This recommendation follows the consensus statement:  Guidelines for Management of Incidental Pulmonary Nodules Detected on CT Images: From the Fleischner Society 2017; Radiology 2017; 284:228-243. Aortic Atherosclerosis (ICD10-I70.0). Electronically Signed   By: Elberta Fortis M.D.   On: 09/05/2020 15:51   CT CHEST WO CONTRAST  Result Date: 09/05/2020 CLINICAL DATA:  Empyema.  Abdominal pain and fever. EXAM: CT CHEST, ABDOMEN AND PELVIS WITHOUT CONTRAST TECHNIQUE: Multidetector CT imaging of the chest, abdomen and pelvis was performed following the standard protocol without IV contrast. COMPARISON:  None. FINDINGS: CT CHEST FINDINGS Cardiovascular: Heart is normal size. Calcified plaque is present over the left main and 3 vessel coronary arteries. Thoracic aorta is normal caliber. There is calcified plaque over the thoracic aorta. Remaining vascular structures are unremarkable. Mediastinum/Nodes: No significant mediastinal or hilar adenopathy. Remaining mediastinal structures are unremarkable. Lungs/Pleura: Tracheostomy tube in adequate position. There is a large right pleural effusion with associated compressive atelectasis in the right base. There is a loculated component of pleural fluid over the anteromedial aspect of the right mid to upper thorax. 5 mm nodular density over the left upper lobe with possible rim calcification. Airways unremarkable. Musculoskeletal: Multiple old bilateral rib fractures. Degenerative change of the spine. CT ABDOMEN PELVIS FINDINGS Hepatobiliary: Gastrostomy tube in adequate position. Pigtail drainage catheter over the right upper quadrant adjacent the border of the right  lobe of the liver. There is a small amount of fluid and air adjacent the pigtail catheter tip which may represent the gallbladder. Common bile duct stent in adequate position. Minimal pneumobilia over the left lobe of the liver. Pancreas: Normal. Spleen: Normal. Adrenals/Urinary Tract: Adrenal glands are normal. Kidneys at the lower limits of normal in size. No  hydronephrosis, nephrolithiasis or focal mass. Ureters are normal. Foley catheter is present within a decompressed bladder. Stomach/Bowel: The stomach is normal. Small bowel is normal. Appendix is not visualized. Colon is normal. Vascular/Lymphatic: Moderate calcified plaque over the abdominal aorta which is normal in caliber. No adenopathy. Reproductive: No focal abnormality. Other: Minimal free fluid over the left pericolic gutter and pelvis. No free peritoneal air or focal inflammatory change. Mild diffuse subcutaneous edema. Musculoskeletal: Moderate degenerative changes of the spine and hips. IMPRESSION: 1. Large right pleural effusion with associated compressive atelectasis in the right base. Loculated component of pleural fluid over the anteromedial aspect of the right mid to upper thorax. Difficult to assess for empyema due to lack of IV contrast. 2. Pigtail drainage catheter over the right upper quadrant adjacent the border of the right lobe of the liver with small amount of fluid and air adjacent the pigtail catheter tip which may represent the gallbladder. Recommend clinical correlation. Common bile duct stent in adequate position. Minimal pneumobilia over the left lobe of the liver. 3. Aortic atherosclerosis. Atherosclerotic coronary artery disease. 4. 5 mm nodule over the left upper lobe. No follow-up needed if patient is low-risk. Non-contrast chest CT can be considered in 12 months if patient is high-risk. This recommendation follows the consensus statement: Guidelines for Management of Incidental Pulmonary Nodules Detected on CT Images: From the Fleischner Society 2017; Radiology 2017; 284:228-243. Aortic Atherosclerosis (ICD10-I70.0). Electronically Signed   By: Elberta Fortisaniel  Boyle M.D.   On: 09/05/2020 15:51   DG Chest Port 1 View  Result Date: 09/06/2020 CLINICAL DATA:  Status post thoracentesis EXAM: PORTABLE CHEST 1 VIEW COMPARISON:  Chest radiograph from one day prior. FINDINGS: Tracheostomy tube  tip overlies the tracheal air column at the thoracic inlet. Stable cardiomediastinal silhouette with mild cardiomegaly. No pneumothorax. Low lung volumes. Small to moderate right pleural effusion appears slightly increased, potentially loculated. No left pleural effusion. Cephalization of the pulmonary vasculature without overt pulmonary edema. Hazy opacity throughout right hemithorax appears worsened. IMPRESSION: 1. No pneumothorax. Small to moderate right pleural effusion appears slightly increased, potentially loculated. 2. Low lung volumes. Hazy opacity throughout the right hemithorax appears worsened, favor atelectasis. Electronically Signed   By: Delbert PhenixJason A Poff M.D.   On: 09/06/2020 11:20   DG Chest Port 1 View  Result Date: 09/05/2020 CLINICAL DATA:  Evaluate tracheostomy tube position. EXAM: PORTABLE CHEST 1 VIEW COMPARISON:  09/02/2020 FINDINGS: The tracheostomy tube tip is stable above the carina. Unchanged cardiomediastinal contours. Moderate right pleural effusion is identified. Unchanged veil like opacification of the entire right lung. Left lung appears clear. IMPRESSION: 1. Stable tracheostomy tube position. 2. Persistent right pleural effusion and with veil like opacification of the entire right lung. Electronically Signed   By: Signa Kellaylor  Stroud M.D.   On: 09/05/2020 08:56   US THORACENTESIS ASP PLEURAL SPACE W/IMG GUIDE  Result Date: 09/06/2020 INDICATION: Patient with history of HF, CAD, anoxic brain injury, and recurrent left pleural effusion. Request is made for diagnostic and therapeutic left thoracentesis. EXAM: ULTRASOUND GUIDED DIAGNOSTIC AND THERAPEUTIC LEFT THORACENTESIS MEDICATIONS: 20 mL 1% lidocaine COMPLICATIONS: None immediate. PROCEDURE: An ultrasound guided thoracentesis was thoroughly discussed  with the patient and questions answered. The benefits, risks, alternatives and complications were also discussed. The patient's daughter understands and wishes to proceed with the  procedure. Written consent was obtained. Ultrasound was performed to localize and mark an adequate pocket of fluid in the left chest. The area was then prepped and draped in the normal sterile fashion. 1% Lidocaine was used for local anesthesia. Under ultrasound guidance a 6 Fr Safe-T-Centesis catheter was introduced, however revealed a dry tap. 1% lidocaine was used for local anesthesia for rib space below. Again, under ultrasound guidance a 6 French Safe-T-Centesis catheter was introduced, again revealing a dry tap. Procedure was aborted- the catheter was removed and a dressing applied. Postprocedural chest x-ray was ordered following attempted procedure. FINDINGS: Complex and loculated honeycomb appearing left pleural effusion s/p attempted thoracentesis revealing a dry tap. IMPRESSION: Attempted thoracentesis unsuccessful secondary to dry tap. Procedure was aborted. Read by: Elwin Mocha, PA-C Electronically Signed   By: Simonne Come M.D.   On: 09/06/2020 11:34    Assessment/Plan Active Problems:   Acute on chronic respiratory failure with hypoxia (HCC)   Chronic combined systolic and diastolic heart failure (HCC)   COPD, severe (HCC)   Chronic atrial fibrillation (HCC)   Metabolic encephalopathy   1. Acute on chronic respiratory failure with hypoxia patient has findings of decreased breath sounds on the left CT suggestive of a pleural effusion we will need to speak with cardiothoracic to see if there is any need of a chest tube 2. Pleural effusion appears to have a large pleural effusion on the right side which may very well be an empyema related to chest tube 3. Chronic systolic diastolic heart failure we will continue to follow along patient appears to be optimized from fluid status. 4. Severe COPD medical management 5. Chronic atrial fibrillation rate now rate controlled 6. Metabolic encephalopathy no change   I have personally seen and evaluated the patient, evaluated laboratory and  imaging results, formulated the assessment and plan and placed orders. The Patient requires high complexity decision making with multiple systems involvement.  Rounds were done with the Respiratory Therapy Director and Staff therapists and discussed with nursing staff also.  Yevonne Pax, MD [facility] Pulmonary Critical Care Medicine Sleep Medicine

## 2020-09-06 NOTE — Procedures (Signed)
PROCEDURE SUMMARY:  S/p image-guided left thoracentesis revealing a dry tap. Procedure aborted. Patient tolerated procedure well. No immediate complications. EBL = 0 mL.  CXR ordered.  Please see imaging section of Epic for full dictation.   Gordy Councilman Amadeo Coke PA-C 09/06/2020 11:19 AM

## 2020-09-06 NOTE — Progress Notes (Signed)
IR requested by Dr. Manson Passey for evaluation of percutaneous cholecystostomy tube (placed at outside facility).  On PE, cholecystostomy tube site with minimal erythema, no side holes of tube visible.  CT chest/abdomen/pelvis from 09/05/2020 reviewed by Dr. Grace Isaac who states cholecystostomy tube appears in appropriate position on scan. In addition, patient has internal biliary stent present that appears patent on imaging- possible cause of lack of output (draining through system instead of via bag). Offered possible cholangiogram to confirm position- discussed with Dr. Manson Passey who defers at this time. No plans for IR procedure at this time- will delete order.  Please call IR with questions/concerns.   Waylan Boga Evelise Reine, PA-C 09/06/2020, 11:28 AM

## 2020-09-07 ENCOUNTER — Other Ambulatory Visit (HOSPITAL_COMMUNITY): Payer: Medicare Other

## 2020-09-07 DIAGNOSIS — I5042 Chronic combined systolic (congestive) and diastolic (congestive) heart failure: Secondary | ICD-10-CM | POA: Diagnosis not present

## 2020-09-07 DIAGNOSIS — J449 Chronic obstructive pulmonary disease, unspecified: Secondary | ICD-10-CM | POA: Diagnosis not present

## 2020-09-07 DIAGNOSIS — I482 Chronic atrial fibrillation, unspecified: Secondary | ICD-10-CM | POA: Diagnosis not present

## 2020-09-07 DIAGNOSIS — J9621 Acute and chronic respiratory failure with hypoxia: Secondary | ICD-10-CM | POA: Diagnosis not present

## 2020-09-07 LAB — CBC
HCT: 22.6 % — ABNORMAL LOW (ref 39.0–52.0)
HCT: 28.3 % — ABNORMAL LOW (ref 39.0–52.0)
Hemoglobin: 6.7 g/dL — CL (ref 13.0–17.0)
Hemoglobin: 8.6 g/dL — ABNORMAL LOW (ref 13.0–17.0)
MCH: 27.6 pg (ref 26.0–34.0)
MCH: 27.7 pg (ref 26.0–34.0)
MCHC: 29.6 g/dL — ABNORMAL LOW (ref 30.0–36.0)
MCHC: 30.4 g/dL (ref 30.0–36.0)
MCV: 93 fL (ref 80.0–100.0)
MCV: 91.3 fL (ref 80.0–100.0)
Platelets: 171 10*3/uL (ref 150–400)
Platelets: 175 10*3/uL (ref 150–400)
RBC: 2.43 MIL/uL — ABNORMAL LOW (ref 4.22–5.81)
RBC: 3.1 MIL/uL — ABNORMAL LOW (ref 4.22–5.81)
RDW: 15.4 % (ref 11.5–15.5)
RDW: 15.9 % — ABNORMAL HIGH (ref 11.5–15.5)
WBC: 8.1 10*3/uL (ref 4.0–10.5)
WBC: 8.7 10*3/uL (ref 4.0–10.5)
nRBC: 0 % (ref 0.0–0.2)
nRBC: 0 % (ref 0.0–0.2)

## 2020-09-07 LAB — BASIC METABOLIC PANEL
Anion gap: 11 (ref 5–15)
BUN: 130 mg/dL — ABNORMAL HIGH (ref 8–23)
CO2: 27 mmol/L (ref 22–32)
Calcium: 8.3 mg/dL — ABNORMAL LOW (ref 8.9–10.3)
Chloride: 109 mmol/L (ref 98–111)
Creatinine, Ser: 2.64 mg/dL — ABNORMAL HIGH (ref 0.61–1.24)
GFR calc Af Amer: 26 mL/min — ABNORMAL LOW (ref >60)
GFR calc non Af Amer: 23 mL/min — ABNORMAL LOW (ref >60)
Glucose, Bld: 197 mg/dL — ABNORMAL HIGH (ref 70–99)
Potassium: 4.8 mmol/L (ref 3.5–5.1)
Sodium: 147 mmol/L — ABNORMAL HIGH (ref 135–145)

## 2020-09-07 LAB — PREPARE RBC (CROSSMATCH)

## 2020-09-07 MED ORDER — MIDAZOLAM HCL 2 MG/2ML IJ SOLN
INTRAMUSCULAR | Status: AC | PRN
  Administered 2020-09-07: 0.5 mg via INTRAVENOUS

## 2020-09-07 NOTE — H&P (Signed)
Chief Complaint: Patient was seen in consultation today for right chest tube placement.   Referring Physician(s): Dr. Manson Passey   Supervising Physician: Irish Lack  Patient Status: Eric Knapp, in-patient   History of Present Illness: Eric Knapp is a 79 y.o. male with a medical history significant for HTN, CAD, CHF, COPD, DM, CKD III, cholecystitis/cholelithiasis and acute on chronic respiratory failure. The patient initially presented to an outside hospital for gallbladder issues and underwent an ERCP with sphincterotomy, bile duct stent placement and cholecystostomy tube. His hospital course was complicated by worsening encephalopathy and respiratory failure. He required intubation/ventilation. Hospital course further complicated by cardiac arrest with return of ROSC after CPR interventions. He now has a tracheostomy/g-tube and was transferred to [facility] 08/27/2020. Imaging performed after transfer showed a large right pleural effusion. He was seen by IR 09/02/20 for a right thoracentesis which yielded 2 liters. Repeat imaging shows a return of the effusion with a loculated component.   CT chest 09/05/20: IMPRESSION: 1. Large right pleural effusion with associated compressive atelectasis in the right base. Loculated component of pleural fluid over the anteromedial aspect of the right mid to upper thorax. Difficult to assess for empyema due to lack of IV contrast. 2. Pigtail drainage catheter over the right upper quadrant adjacent the border of the right lobe of the liver with small amount of fluid and air adjacent the pigtail catheter tip which may represent the gallbladder. Recommend clinical correlation. Common bile duct stent in adequate position. Minimal pneumobilia over the left lobe of the liver. 3. Aortic atherosclerosis. Atherosclerotic coronary artery disease. 4. 5 mm nodule over the left upper lobe. No follow-up needed if patient is low-risk. Non-contrast chest  CT can be considered in 12 months if patient is high-risk. This recommendation follows the consensus statement: Guidelines for Management of Incidental Pulmonary Nodules Detected on CT Images: From the Fleischner Society 2017; Radiology 2017; 284:228-243.  Interventional Radiology asked to evaluate this patient for the placement of an image-guided right chest tube. This case has been reviewed and procedure approved by Dr. Grace Isaac.   Past Medical History:  Diagnosis Date  . Acute on chronic respiratory failure with hypoxia (HCC)   . Chronic atrial fibrillation (HCC)   . Chronic combined systolic and diastolic heart failure (HCC)   . COPD, severe (HCC)   . Metabolic encephalopathy   . Pleural effusion, right    Allergies: No allergies listed in Epic. Please check the [facility] chart when patient arrives to IR.   Medications: Prior to Admission medications   Not on File     Social History   Socioeconomic History  . Marital status: Married    Spouse name: Not on file  . Number of children: Not on file  . Years of education: Not on file  . Highest education level: Not on file  Occupational History  . Not on file  Tobacco Use  . Smoking status: Not on file  Substance and Sexual Activity  . Alcohol use: Not on file  . Drug use: Not on file  . Sexual activity: Not on file  Other Topics Concern  . Not on file  Social History Narrative  . Not on file   Social Determinants of Health   Financial Resource Strain:   . Difficulty of Paying Living Expenses: Not on file  Food Insecurity:   . Worried About Programme researcher, broadcasting/film/video in the Last Year: Not on file  . Ran Out of Food in the  Last Year: Not on file  Transportation Needs:   . Lack of Transportation (Medical): Not on file  . Lack of Transportation (Non-Medical): Not on file  Physical Activity:   . Days of Exercise per Week: Not on file  . Minutes of Exercise per Session: Not on file  Stress:   . Feeling of  Stress : Not on file  Social Connections:   . Frequency of Communication with Friends and Family: Not on file  . Frequency of Social Gatherings with Friends and Family: Not on file  . Attends Religious Services: Not on file  . Active Member of Clubs or Organizations: Not on file  . Attends Banker Meetings: Not on file  . Marital Status: Not on file    Review of Systems: A 12 point ROS discussed and pertinent positives are indicated in the HPI above.  All other systems are negative.  Review of Systems  Unable to perform ROS: Acuity of condition    Vital Signs: HR 89, BP 151/70, RR 21, O2 99% on Ventilator   Physical Exam Constitutional:      General: He is not in acute distress.    Comments: Tracheostomy/ventilator. Unresponsive to voice; eyes closed.   HENT:     Mouth/Throat:     Mouth: Mucous membranes are dry.     Comments: Tongue with mild edema and protruding from mouth.  Cardiovascular:     Rate and Rhythm: Normal rate and regular rhythm.     Pulses: Normal pulses.     Heart sounds: Normal heart sounds.  Pulmonary:     Breath sounds: Examination of the right-upper field reveals decreased breath sounds. Examination of the right-middle field reveals decreased breath sounds. Examination of the right-lower field reveals decreased breath sounds. Decreased breath sounds present.     Comments: Tracheostomy/ventilator.  Abdominal:     General: Bowel sounds are normal.     Palpations: Abdomen is soft.     Comments: G-tube and cholecystostomy tube in place.   Genitourinary:    Comments: foley Musculoskeletal:     Comments: Left foot amputation.   Skin:    General: Skin is warm and dry.     Comments: Dry, flaky skin     Imaging: CT ABDOMEN PELVIS WO CONTRAST  Result Date: 09/05/2020 CLINICAL DATA:  Empyema.  Abdominal pain and fever. EXAM: CT CHEST, ABDOMEN AND PELVIS WITHOUT CONTRAST TECHNIQUE: Multidetector CT imaging of the chest, abdomen and pelvis was  performed following the standard protocol without IV contrast. COMPARISON:  None. FINDINGS: CT CHEST FINDINGS Cardiovascular: Heart is normal size. Calcified plaque is present over the left main and 3 vessel coronary arteries. Thoracic aorta is normal caliber. There is calcified plaque over the thoracic aorta. Remaining vascular structures are unremarkable. Mediastinum/Nodes: No significant mediastinal or hilar adenopathy. Remaining mediastinal structures are unremarkable. Lungs/Pleura: Tracheostomy tube in adequate position. There is a large right pleural effusion with associated compressive atelectasis in the right base. There is a loculated component of pleural fluid over the anteromedial aspect of the right mid to upper thorax. 5 mm nodular density over the left upper lobe with possible rim calcification. Airways unremarkable. Musculoskeletal: Multiple old bilateral rib fractures. Degenerative change of the spine. CT ABDOMEN PELVIS FINDINGS Hepatobiliary: Gastrostomy tube in adequate position. Pigtail drainage catheter over the right upper quadrant adjacent the border of the right lobe of the liver. There is a small amount of fluid and air adjacent the pigtail catheter tip which may represent the gallbladder.  Common bile duct stent in adequate position. Minimal pneumobilia over the left lobe of the liver. Pancreas: Normal. Spleen: Normal. Adrenals/Urinary Tract: Adrenal glands are normal. Kidneys at the lower limits of normal in size. No hydronephrosis, nephrolithiasis or focal mass. Ureters are normal. Foley catheter is present within a decompressed bladder. Stomach/Bowel: The stomach is normal. Small bowel is normal. Appendix is not visualized. Colon is normal. Vascular/Lymphatic: Moderate calcified plaque over the abdominal aorta which is normal in caliber. No adenopathy. Reproductive: No focal abnormality. Other: Minimal free fluid over the left pericolic gutter and pelvis. No free peritoneal air or focal  inflammatory change. Mild diffuse subcutaneous edema. Musculoskeletal: Moderate degenerative changes of the spine and hips. IMPRESSION: 1. Large right pleural effusion with associated compressive atelectasis in the right base. Loculated component of pleural fluid over the anteromedial aspect of the right mid to upper thorax. Difficult to assess for empyema due to lack of IV contrast. 2. Pigtail drainage catheter over the right upper quadrant adjacent the border of the right lobe of the liver with small amount of fluid and air adjacent the pigtail catheter tip which may represent the gallbladder. Recommend clinical correlation. Common bile duct stent in adequate position. Minimal pneumobilia over the left lobe of the liver. 3. Aortic atherosclerosis. Atherosclerotic coronary artery disease. 4. 5 mm nodule over the left upper lobe. No follow-up needed if patient is low-risk. Non-contrast chest CT can be considered in 12 months if patient is high-risk. This recommendation follows the consensus statement: Guidelines for Management of Incidental Pulmonary Nodules Detected on CT Images: From the Fleischner Society 2017; Radiology 2017; 284:228-243. Aortic Atherosclerosis (ICD10-I70.0). Electronically Signed   By: Elberta Fortis M.D.   On: 09/05/2020 15:51   CT CHEST WO CONTRAST  Result Date: 09/05/2020 CLINICAL DATA:  Empyema.  Abdominal pain and fever. EXAM: CT CHEST, ABDOMEN AND PELVIS WITHOUT CONTRAST TECHNIQUE: Multidetector CT imaging of the chest, abdomen and pelvis was performed following the standard protocol without IV contrast. COMPARISON:  None. FINDINGS: CT CHEST FINDINGS Cardiovascular: Heart is normal size. Calcified plaque is present over the left main and 3 vessel coronary arteries. Thoracic aorta is normal caliber. There is calcified plaque over the thoracic aorta. Remaining vascular structures are unremarkable. Mediastinum/Nodes: No significant mediastinal or hilar adenopathy. Remaining mediastinal  structures are unremarkable. Lungs/Pleura: Tracheostomy tube in adequate position. There is a large right pleural effusion with associated compressive atelectasis in the right base. There is a loculated component of pleural fluid over the anteromedial aspect of the right mid to upper thorax. 5 mm nodular density over the left upper lobe with possible rim calcification. Airways unremarkable. Musculoskeletal: Multiple old bilateral rib fractures. Degenerative change of the spine. CT ABDOMEN PELVIS FINDINGS Hepatobiliary: Gastrostomy tube in adequate position. Pigtail drainage catheter over the right upper quadrant adjacent the border of the right lobe of the liver. There is a small amount of fluid and air adjacent the pigtail catheter tip which may represent the gallbladder. Common bile duct stent in adequate position. Minimal pneumobilia over the left lobe of the liver. Pancreas: Normal. Spleen: Normal. Adrenals/Urinary Tract: Adrenal glands are normal. Kidneys at the lower limits of normal in size. No hydronephrosis, nephrolithiasis or focal mass. Ureters are normal. Foley catheter is present within a decompressed bladder. Stomach/Bowel: The stomach is normal. Small bowel is normal. Appendix is not visualized. Colon is normal. Vascular/Lymphatic: Moderate calcified plaque over the abdominal aorta which is normal in caliber. No adenopathy. Reproductive: No focal abnormality. Other: Minimal free fluid  over the left pericolic gutter and pelvis. No free peritoneal air or focal inflammatory change. Mild diffuse subcutaneous edema. Musculoskeletal: Moderate degenerative changes of the spine and hips. IMPRESSION: 1. Large right pleural effusion with associated compressive atelectasis in the right base. Loculated component of pleural fluid over the anteromedial aspect of the right mid to upper thorax. Difficult to assess for empyema due to lack of IV contrast. 2. Pigtail drainage catheter over the right upper quadrant  adjacent the border of the right lobe of the liver with small amount of fluid and air adjacent the pigtail catheter tip which may represent the gallbladder. Recommend clinical correlation. Common bile duct stent in adequate position. Minimal pneumobilia over the left lobe of the liver. 3. Aortic atherosclerosis. Atherosclerotic coronary artery disease. 4. 5 mm nodule over the left upper lobe. No follow-up needed if patient is low-risk. Non-contrast chest CT can be considered in 12 months if patient is high-risk. This recommendation follows the consensus statement: Guidelines for Management of Incidental Pulmonary Nodules Detected on CT Images: From the Fleischner Society 2017; Radiology 2017; 284:228-243. Aortic Atherosclerosis (ICD10-I70.0). Electronically Signed   By: Elberta Fortis M.D.   On: 09/05/2020 15:51   DG ABDOMEN PEG TUBE LOCATION  Result Date: 09/09/2020 CLINICAL DATA:  Peg tube placement. EXAM: ABDOMEN - 1 VIEW COMPARISON:  None. FINDINGS: The bowel gas pattern is normal. Distal tip of PEG tube appears to be within the gastric lumen. Normal contrast filling of the gastric lumen and proximal duodenum is noted. No extravasation or leakage is noted. No radio-opaque calculi or other significant radiographic abnormality are seen. Common bile duct stent is noted. Pigtail catheter is noted in right upper quadrant. IMPRESSION: Distal tip of PEG tube appears to be within the gastric lumen. No evidence of extravasation or leakage. No evidence of bowel obstruction or ileus. Electronically Signed   By: Lupita Raider M.D.   On: 09/16/2020 19:02   DG Chest Port 1 View  Result Date: 09/06/2020 CLINICAL DATA:  Status post thoracentesis EXAM: PORTABLE CHEST 1 VIEW COMPARISON:  Chest radiograph from one day prior. FINDINGS: Tracheostomy tube tip overlies the tracheal air column at the thoracic inlet. Stable cardiomediastinal silhouette with mild cardiomegaly. No pneumothorax. Low lung volumes. Small to moderate  right pleural effusion appears slightly increased, potentially loculated. No left pleural effusion. Cephalization of the pulmonary vasculature without overt pulmonary edema. Hazy opacity throughout right hemithorax appears worsened. IMPRESSION: 1. No pneumothorax. Small to moderate right pleural effusion appears slightly increased, potentially loculated. 2. Low lung volumes. Hazy opacity throughout the right hemithorax appears worsened, favor atelectasis. Electronically Signed   By: Delbert Phenix M.D.   On: 09/06/2020 11:20   DG Chest Port 1 View  Result Date: 09/05/2020 CLINICAL DATA:  Evaluate tracheostomy tube position. EXAM: PORTABLE CHEST 1 VIEW COMPARISON:  09/02/2020 FINDINGS: The tracheostomy tube tip is stable above the carina. Unchanged cardiomediastinal contours. Moderate right pleural effusion is identified. Unchanged veil like opacification of the entire right lung. Left lung appears clear. IMPRESSION: 1. Stable tracheostomy tube position. 2. Persistent right pleural effusion and with veil like opacification of the entire right lung. Electronically Signed   By: Signa Kell M.D.   On: 09/05/2020 08:56   DG Chest Port 1 View  Result Date: 09/02/2020 CLINICAL DATA:  Status post right thoracentesis yielding 2 L EXAM: PORTABLE CHEST 1 VIEW COMPARISON:  Chest x-ray 09/15/2020 FINDINGS: Tracheostomy tube with tip terminating approximately 6 cm above the carina. Persistent enlarged cardiac silhouette. Aortic  arch calcifications. Interval decrease in size of a persistent moderate sized right pleural effusion. Improved aeration of the right lung with probable right lower lobe atelectasis. No focal consolidation identified. No pulmonary edema. No left pleural effusion. No focal consolidation within the left lung. No pneumothorax. No acute osseous abnormality. IMPRESSION: 1. Interval decrease in size of a moderate right pleural effusion with improved aeration of the right lung. Likely underlying  atelectasis. Focal consolidation cannot be excluded. 2. Cardiomegaly. Electronically Signed   By: Tish Frederickson M.D.   On: 09/02/2020 16:16   DG CHEST PORT 1 VIEW  Result Date: 08/21/2020 CLINICAL DATA:  Respiratory failure. EXAM: PORTABLE CHEST 1 VIEW COMPARISON:  None. FINDINGS: Mild cardiomegaly is noted. Tracheostomy tube is in good position. Large right pleural effusion is noted with aerated right upper lobe, but probable right lower lobe atelectasis. No pneumothorax is noted. Left lung is unremarkable. Bony thorax appears normal. IMPRESSION: Large right pleural effusion is noted with aerated right upper lobe, but probable right lower lobe atelectasis. Electronically Signed   By: Lupita Raider M.D.   On: 09/02/2020 19:01   IR THORACENTESIS ASP PLEURAL SPACE W/IMG GUIDE  Result Date: 09/02/2020 INDICATION: Patient with history of HF, CAD, anoxic brain injury, and bilateral pleural effusions, right > left. Request is made for therapeutic right thoracentesis. EXAM: ULTRASOUND GUIDED THERAPEUTIC RIGHT THORACENTESIS MEDICATIONS: 18 mL 1% lidocaine COMPLICATIONS: None immediate. PROCEDURE: An ultrasound guided thoracentesis was thoroughly discussed with the patient and questions answered. The benefits, risks, alternatives and complications were also discussed. The patient understands and wishes to proceed with the procedure. Written consent was obtained. Ultrasound was performed to localize and mark an adequate pocket of fluid in the right chest. The area was then prepped and draped in the normal sterile fashion. 1% Lidocaine was used for local anesthesia. Under ultrasound guidance a 6 Fr Safe-T-Centesis catheter was introduced. Thoracentesis was performed. The catheter was removed and a dressing applied. FINDINGS: A total of approximately 2 L of dark red fluid was removed. Procedure was stopped after 2 L per IR protocol as this was patient's first thoracentesis. IMPRESSION: Successful ultrasound guided  right thoracentesis yielding 2 L of pleural fluid. Read by: Elwin Mocha, PA-C Electronically Signed   By: Marliss Coots MD   On: 09/02/2020 16:26   US THORACENTESIS ASP PLEURAL SPACE W/IMG GUIDE  Result Date: 09/06/2020 INDICATION: Patient with history of HF, CAD, anoxic brain injury, and recurrent left pleural effusion. Request is made for diagnostic and therapeutic left thoracentesis. EXAM: ULTRASOUND GUIDED DIAGNOSTIC AND THERAPEUTIC LEFT THORACENTESIS MEDICATIONS: 20 mL 1% lidocaine COMPLICATIONS: None immediate. PROCEDURE: An ultrasound guided thoracentesis was thoroughly discussed with the patient and questions answered. The benefits, risks, alternatives and complications were also discussed. The patient's daughter understands and wishes to proceed with the procedure. Written consent was obtained. Ultrasound was performed to localize and mark an adequate pocket of fluid in the left chest. The area was then prepped and draped in the normal sterile fashion. 1% Lidocaine was used for local anesthesia. Under ultrasound guidance a 6 Fr Safe-T-Centesis catheter was introduced, however revealed a dry tap. 1% lidocaine was used for local anesthesia for rib space below. Again, under ultrasound guidance a 6 French Safe-T-Centesis catheter was introduced, again revealing a dry tap. Procedure was aborted- the catheter was removed and a dressing applied. Postprocedural chest x-ray was ordered following attempted procedure. FINDINGS: Complex and loculated honeycomb appearing left pleural effusion s/p attempted thoracentesis revealing a dry tap. IMPRESSION: Attempted  thoracentesis unsuccessful secondary to dry tap. Procedure was aborted. Read by: Elwin MochaAlexandra Louk, PA-C Electronically Signed   By: Simonne ComeJohn  Watts M.D.   On: 09/06/2020 11:34    Labs:  CBC: Recent Labs    09/04/20 0632 09/04/20 1646 09/05/20 0717 09/07/20 0441  WBC 6.9 6.7 9.6 8.1  HGB 6.5* 8.2* 9.0* 6.7*  HCT 22.7* 26.8* 29.7* 22.6*  PLT 200  221 245 171    COAGS: Recent Labs    09/01/20 0955  INR 1.7*    BMP: Recent Labs    09/05/20 1007 09/05/20 1616 09/06/20 1843 09/07/20 0441  NA 148* 148* 144 147*  K 6.1* 5.2* 5.3* 4.8  CL 113* 111 108 109  CO2 27 26 24 27   GLUCOSE 197* 155* 271* 197*  BUN 109* 117* 123* 130*  CALCIUM 8.6* 8.5* 8.2* 8.3*  CREATININE 2.06* 2.33* 2.50* 2.64*  GFRNONAA 30* 26* 24* 23*  GFRAA 35* 30* 28* 26*    LIVER FUNCTION TESTS: Recent Labs    09/01/20 0955  BILITOT 1.1  AST 45*  ALT 44  ALKPHOS 106  PROT 6.4*  ALBUMIN 2.1*    TUMOR MARKERS: No results for input(s): AFPTM, CEA, CA199, CHROMGRNA in the last 8760 hours.  Assessment and Plan:  Right pleural effusion/empyema: Eric Knapp, 79 year old male, [facility] for an image-guided right chest tube. Telephone consent was obtained from the patient's daughter, Vinie Sillgnes Lee.   Risks and benefits discussed with the patient's daughter including bleeding, infection, damage to adjacent structures, and sepsis.  All of the patient's daughters questions were answered, patient's daughter  is agreeable to proceed.  Consent signed and in the IR control room.   Thank you for this interesting consult.  I greatly enjoyed meeting Sonya Barbaraann FasterSteen and look forward to participating in their care.  A copy of this report was sent to the requesting provider on this date.  Electronically Signed: Alwyn RenJamie Natoya Viscomi, AGACNP-BC [phone] 09/07/2020, 10:49 AM   I spent a total of 20 Minutes    in face to face in clinical consultation, greater than 50% of which was counseling/coordinating care for right chest tube

## 2020-09-07 NOTE — Consult Note (Signed)
[facility] CONSULT NOTE    Date: 09/07/2020                  Patient Name:  Eric Knapp  MRN: [id number]  DOB: [birth year]  Age / Sex: 79 y.o., male         PCP: Default, Provider, MD                 Service Requesting Consult:  Hospitalist                 Reason for Consult:  Acute kidney injury, chronic kidney disease stage IIIb            History of Present Illness: Patient is a 79 y.o. male with a PMHx of hyperlipidemia, hypertension, coronary artery disease, chronic diastolic heart failure, COPD, diabetes mellitus type 2, who was admitted to [facility] on 09/08/2020 for ongoing treatment of acute respiratory failure.  He recently came to the outside hospital due to cholecystitis.  During that admission patient had ERCP and underwent sphincterotomy with bile duct placement.  He ended up having respiratory failure and was intubated and placed on the ventilator.  He also unfortunately suffered a cardiac arrest.  Subsequently tracheostomy was placed as well as PEG tube placement.  We are asked to see him for evaluation management of acute kidney injury.  His baseline creatinine appears to be 1.7 with an EGFR of 38.  He has now developed worsening renal parameters.  His BUN is up to 130 with a creatinine of 2.6.  Serum sodium also high at 147.  He also has a possible left-sided empyema.  Interventional radiology has been consulted for this issue.  Patient also found to be significantly anemic with a hemoglobin of 6.7.  Recent CT scan of the abdomen pelvis was negative for hydronephrosis.   Medications: Outpatient medications: No medications prior to admission.    Current medications: Current Facility-Administered Medications  Medication Dose Route Frequency Provider Last Rate Last Admin  . lidocaine (PF) (XYLOCAINE) 1 % injection    PRN Louk, Alexandra M, PA-C   20 mL at 09/02/20 1545      Allergies: Not on File    Past Medical History: Past Medical  History:  Diagnosis Date  . Acute on chronic respiratory failure with hypoxia ([place])   . Chronic atrial fibrillation ([place])   . Chronic combined systolic and diastolic heart failure ([place])   . COPD, severe ([place])   . Metabolic encephalopathy   . Pleural effusion, right   Hypertension Hyperlipidemia Diabetes mellitus type 2 Chronic kidney disease stage IIIb    Past Surgical History: Cholecystitis with history of ERCP on bile duct placement Tracheostomy PEG tube   Family History: Unable to obtain from the patient as he is currently on the ventilator.  Social History: Unable to obtain from the patient as he is currently on the ventilator.  Review of Systems: Unable to obtain from the patient as he is currently on the ventilator.  Vital Signs: Temperature 97.7 pulse 102 respirations 22 blood pressure 105/45 Weight trends: There were no vitals filed for this visit.  Physical Exam: Physical Exam: General:  Critically ill-appearing  Head:  Normocephalic, atraumatic.  Dry oral mucosa  Eyes:  Anicteric  Neck:  Tracheostomy in place  Lungs:   Bilateral rhonchi, vent assisted  Heart:  S1S2 slightly tachycardic  Abdomen:   Soft, nontender, PEG tube in place  Extremities:  No peripheral edema.  Neurologic:  Lethargic but  arousable  Skin:  No acute rashes  GU:  Foley catheter in place    Lab results: Basic Metabolic Panel: Recent Labs  Lab 09/05/20 1007 09/05/20 1007 09/05/20 1616 09/06/20 1843 09/07/20 0441  NA 148*   < > 148* 144 147*  K 6.1*   < > 5.2* 5.3* 4.8  CL 113*   < > 111 108 109  CO2 27   < > $R'26 24 27  'Zo$ GLUCOSE 197*   < > 155* 271* 197*  BUN 109*   < > 117* 123* 130*  CREATININE 2.06*   < > 2.33* 2.50* 2.64*  CALCIUM 8.6*   < > 8.5* 8.2* 8.3*  MG 1.9  --   --   --   --    < > = values in this interval not displayed.    Liver Function Tests: Recent Labs  Lab 09/01/20 0955  AST 45*  ALT 44  ALKPHOS 106  BILITOT 1.1  PROT 6.4*  ALBUMIN 2.1*   No  results for input(s): LIPASE, AMYLASE in the last 168 hours. No results for input(s): AMMONIA in the last 168 hours.  CBC: Recent Labs  Lab 09/01/20 0955 09/01/20 0955 09/04/20 0632 09/04/20 0632 09/04/20 1646 09/05/20 0717 09/07/20 0441  WBC 7.7   < > 6.9   < > 6.7 9.6 8.1  HGB 7.6*   < > 6.5*   < > 8.2* 9.0* 6.7*  HCT 25.1*   < > 22.7*   < > 26.8* 29.7* 22.6*  MCV 94.7  --  94.6  --  94.4 93.7 93.0  PLT 232   < > 200   < > 221 245 171   < > = values in this interval not displayed.    Cardiac Enzymes: No results for input(s): CKTOTAL, CKMB, CKMBINDEX, TROPONINI in the last 168 hours.  BNP: Invalid input(s): POCBNP  CBG: No results for input(s): GLUCAP in the last 168 hours.  Microbiology: Results for orders placed or performed during the hospital encounter of 09/13/2020  SARS CORONAVIRUS 2 (TAT 6-24 HRS) Nasopharyngeal Nasopharyngeal Swab     Status: None   Collection Time: 09/01/20  1:19 PM   Specimen: Nasopharyngeal Swab  Result Value Ref Range Status   SARS Coronavirus 2 NEGATIVE NEGATIVE Final    Comment: (NOTE) SARS-CoV-2 target nucleic acids are NOT DETECTED.  The SARS-CoV-2 RNA is generally detectable in upper and lower respiratory specimens during the acute phase of infection. Negative results do not preclude SARS-CoV-2 infection, do not rule out co-infections with other pathogens, and should not be used as the sole basis for treatment or other patient management decisions. Negative results must be combined with clinical observations, patient history, and epidemiological information. The expected result is Negative.  Fact Sheet for Patients: SugarRoll.be  Fact Sheet for Healthcare Providers: https://www.woods-mathews.com/  This test is not yet approved or cleared by the [place] FDA and  has been authorized for detection and/or diagnosis of SARS-CoV-2 by FDA under an Emergency Use Authorization (EUA). This  EUA will remain  in effect (meaning this test can be used) for the duration of the COVID-19 declaration under Se ction 564(b)(1) of the Act, 21 U.S.C. section 360bbb-3(b)(1), unless the authorization is terminated or revoked sooner.  Performed at [facility], [place] [address], [place], [place] [address]   Culture, Urine     Status: Abnormal   Collection Time: 09/05/20 12:41 PM   Specimen: Urine, Random  Result Value Ref Range Status  Specimen Description URINE, RANDOM  Final   Special Requests NONE  Final   Culture (A)  Final    <10,000 COLONIES/mL INSIGNIFICANT GROWTH Performed at [facility], [place] [address], [place], [place] [address]    Report Status 09/06/2020 FINAL  Final  Culture, blood (routine x 2)     Status: None (Preliminary result)   Collection Time: 09/05/20  4:16 PM   Specimen: BLOOD  Result Value Ref Range Status   Specimen Description BLOOD RIGHT ANTECUBITAL  Final   Special Requests   Final    BOTTLES DRAWN AEROBIC AND ANAEROBIC Blood Culture adequate volume   Culture   Final    NO GROWTH < 24 HOURS Performed at [facility], [place] [address] [place], [place] [address]    Report Status PENDING  Incomplete  Culture, blood (routine x 2)     Status: None (Preliminary result)   Collection Time: 09/05/20  4:16 PM   Specimen: BLOOD LEFT HAND  Result Value Ref Range Status   Specimen Description BLOOD LEFT HAND  Final   Special Requests   Final    BOTTLES DRAWN AEROBIC AND ANAEROBIC Blood Culture adequate volume   Culture   Final    NO GROWTH < 24 HOURS Performed at [facility], [place] [address] [place], [place] [address]    Report Status PENDING  Incomplete    Coagulation Studies: No results for input(s): LABPROT, INR in the last 72 hours.  Urinalysis: Recent Labs    09/05/20 1330  COLORURINE AMBER*  LABSPEC 1.012  PHURINE 5.0  GLUCOSEU NEGATIVE  HGBUR LARGE*  BILIRUBINUR NEGATIVE  KETONESUR NEGATIVE  PROTEINUR 100*   NITRITE NEGATIVE  LEUKOCYTESUR LARGE*      Imaging: CT ABDOMEN PELVIS WO CONTRAST  Result Date: 09/05/2020 CLINICAL DATA:  Empyema.  Abdominal pain and fever. EXAM: CT CHEST, ABDOMEN AND PELVIS WITHOUT CONTRAST TECHNIQUE: Multidetector CT imaging of the chest, abdomen and pelvis was performed following the standard protocol without IV contrast. COMPARISON:  None. FINDINGS: CT CHEST FINDINGS Cardiovascular: Heart is normal size. Calcified plaque is present over the left main and 3 vessel coronary arteries. Thoracic aorta is normal caliber. There is calcified plaque over the thoracic aorta. Remaining vascular structures are unremarkable. Mediastinum/Nodes: No significant mediastinal or hilar adenopathy. Remaining mediastinal structures are unremarkable. Lungs/Pleura: Tracheostomy tube in adequate position. There is a large right pleural effusion with associated compressive atelectasis in the right base. There is a loculated component of pleural fluid over the anteromedial aspect of the right mid to upper thorax. 5 mm nodular density over the left upper lobe with possible rim calcification. Airways unremarkable. Musculoskeletal: Multiple old bilateral rib fractures. Degenerative change of the spine. CT ABDOMEN PELVIS FINDINGS Hepatobiliary: Gastrostomy tube in adequate position. Pigtail drainage catheter over the right upper quadrant adjacent the border of the right lobe of the liver. There is a small amount of fluid and air adjacent the pigtail catheter tip which may represent the gallbladder. Common bile duct stent in adequate position. Minimal pneumobilia over the left lobe of the liver. Pancreas: Normal. Spleen: Normal. Adrenals/Urinary Tract: Adrenal glands are normal. Kidneys at the lower limits of normal in size. No hydronephrosis, nephrolithiasis or focal mass. Ureters are normal. Foley catheter is present within a decompressed bladder. Stomach/Bowel: The stomach is normal. Small bowel is normal.  Appendix is not visualized. Colon is normal. Vascular/Lymphatic: Moderate calcified plaque over the abdominal aorta which is normal in caliber. No adenopathy. Reproductive:  No focal abnormality. Other: Minimal free fluid over the left pericolic gutter and pelvis. No free peritoneal air or focal inflammatory change. Mild diffuse subcutaneous edema. Musculoskeletal: Moderate degenerative changes of the spine and hips. IMPRESSION: 1. Large right pleural effusion with associated compressive atelectasis in the right base. Loculated component of pleural fluid over the anteromedial aspect of the right mid to upper thorax. Difficult to assess for empyema due to lack of IV contrast. 2. Pigtail drainage catheter over the right upper quadrant adjacent the border of the right lobe of the liver with small amount of fluid and air adjacent the pigtail catheter tip which may represent the gallbladder. Recommend clinical correlation. Common bile duct stent in adequate position. Minimal pneumobilia over the left lobe of the liver. 3. Aortic atherosclerosis. Atherosclerotic coronary artery disease. 4. 5 mm nodule over the left upper lobe. No follow-up needed if patient is low-risk. Non-contrast chest CT can be considered in 12 months if patient is high-risk. This recommendation follows the consensus statement: Guidelines for Management of Incidental Pulmonary Nodules Detected on CT Images: From the Fleischner Society 2017; Radiology 2017; 284:228-243. Aortic Atherosclerosis (ICD10-I70.0). Electronically Signed   By: Marin Olp M.D.   On: 09/05/2020 15:51   CT CHEST WO CONTRAST  Result Date: 09/05/2020 CLINICAL DATA:  Empyema.  Abdominal pain and fever. EXAM: CT CHEST, ABDOMEN AND PELVIS WITHOUT CONTRAST TECHNIQUE: Multidetector CT imaging of the chest, abdomen and pelvis was performed following the standard protocol without IV contrast. COMPARISON:  None. FINDINGS: CT CHEST FINDINGS Cardiovascular: Heart is normal size.  Calcified plaque is present over the left main and 3 vessel coronary arteries. Thoracic aorta is normal caliber. There is calcified plaque over the thoracic aorta. Remaining vascular structures are unremarkable. Mediastinum/Nodes: No significant mediastinal or hilar adenopathy. Remaining mediastinal structures are unremarkable. Lungs/Pleura: Tracheostomy tube in adequate position. There is a large right pleural effusion with associated compressive atelectasis in the right base. There is a loculated component of pleural fluid over the anteromedial aspect of the right mid to upper thorax. 5 mm nodular density over the left upper lobe with possible rim calcification. Airways unremarkable. Musculoskeletal: Multiple old bilateral rib fractures. Degenerative change of the spine. CT ABDOMEN PELVIS FINDINGS Hepatobiliary: Gastrostomy tube in adequate position. Pigtail drainage catheter over the right upper quadrant adjacent the border of the right lobe of the liver. There is a small amount of fluid and air adjacent the pigtail catheter tip which may represent the gallbladder. Common bile duct stent in adequate position. Minimal pneumobilia over the left lobe of the liver. Pancreas: Normal. Spleen: Normal. Adrenals/Urinary Tract: Adrenal glands are normal. Kidneys at the lower limits of normal in size. No hydronephrosis, nephrolithiasis or focal mass. Ureters are normal. Foley catheter is present within a decompressed bladder. Stomach/Bowel: The stomach is normal. Small bowel is normal. Appendix is not visualized. Colon is normal. Vascular/Lymphatic: Moderate calcified plaque over the abdominal aorta which is normal in caliber. No adenopathy. Reproductive: No focal abnormality. Other: Minimal free fluid over the left pericolic gutter and pelvis. No free peritoneal air or focal inflammatory change. Mild diffuse subcutaneous edema. Musculoskeletal: Moderate degenerative changes of the spine and hips. IMPRESSION: 1. Large right  pleural effusion with associated compressive atelectasis in the right base. Loculated component of pleural fluid over the anteromedial aspect of the right mid to upper thorax. Difficult to assess for empyema due to lack of IV contrast. 2. Pigtail drainage catheter over the right upper quadrant adjacent the border of the right  lobe of the liver with small amount of fluid and air adjacent the pigtail catheter tip which may represent the gallbladder. Recommend clinical correlation. Common bile duct stent in adequate position. Minimal pneumobilia over the left lobe of the liver. 3. Aortic atherosclerosis. Atherosclerotic coronary artery disease. 4. 5 mm nodule over the left upper lobe. No follow-up needed if patient is low-risk. Non-contrast chest CT can be considered in 12 months if patient is high-risk. This recommendation follows the consensus statement: Guidelines for Management of Incidental Pulmonary Nodules Detected on CT Images: From the Fleischner Society 2017; Radiology 2017; 284:228-243. Aortic Atherosclerosis (ICD10-I70.0). Electronically Signed   By: Marin Olp M.D.   On: 09/05/2020 15:51   DG Chest Port 1 View  Result Date: 09/06/2020 CLINICAL DATA:  Status post thoracentesis EXAM: PORTABLE CHEST 1 VIEW COMPARISON:  Chest radiograph from one day prior. FINDINGS: Tracheostomy tube tip overlies the tracheal air column at the thoracic inlet. Stable cardiomediastinal silhouette with mild cardiomegaly. No pneumothorax. Low lung volumes. Small to moderate right pleural effusion appears slightly increased, potentially loculated. No left pleural effusion. Cephalization of the pulmonary vasculature without overt pulmonary edema. Hazy opacity throughout right hemithorax appears worsened. IMPRESSION: 1. No pneumothorax. Small to moderate right pleural effusion appears slightly increased, potentially loculated. 2. Low lung volumes. Hazy opacity throughout the right hemithorax appears worsened, favor  atelectasis. Electronically Signed   By: Ilona Sorrel M.D.   On: 09/06/2020 11:20   US THORACENTESIS ASP PLEURAL SPACE W/IMG GUIDE  Result Date: 09/06/2020 INDICATION: Patient with history of HF, CAD, anoxic brain injury, and recurrent left pleural effusion. Request is made for diagnostic and therapeutic left thoracentesis. EXAM: ULTRASOUND GUIDED DIAGNOSTIC AND THERAPEUTIC LEFT THORACENTESIS MEDICATIONS: 20 mL 1% lidocaine COMPLICATIONS: None immediate. PROCEDURE: An ultrasound guided thoracentesis was thoroughly discussed with the patient and questions answered. The benefits, risks, alternatives and complications were also discussed. The patient's daughter understands and wishes to proceed with the procedure. Written consent was obtained. Ultrasound was performed to localize and mark an adequate pocket of fluid in the left chest. The area was then prepped and draped in the normal sterile fashion. 1% Lidocaine was used for local anesthesia. Under ultrasound guidance a 6 Fr Safe-T-Centesis catheter was introduced, however revealed a dry tap. 1% lidocaine was used for local anesthesia for rib space below. Again, under ultrasound guidance a 6 French Safe-T-Centesis catheter was introduced, again revealing a dry tap. Procedure was aborted- the catheter was removed and a dressing applied. Postprocedural chest x-ray was ordered following attempted procedure. FINDINGS: Complex and loculated honeycomb appearing left pleural effusion s/p attempted thoracentesis revealing a dry tap. IMPRESSION: Attempted thoracentesis unsuccessful secondary to dry tap. Procedure was aborted. Read by: Earley Abide, PA-C Electronically Signed   By: Sandi Mariscal M.D.   On: 09/06/2020 11:34      Assessment & Plan: Pt is a 79 y.o. male with a PMHx of hyperlipidemia, hypertension, coronary artery disease, chronic diastolic heart failure, COPD, diabetes mellitus type 2, who was admitted to [facility] on 08/29/2020 for ongoing  treatment of acute respiratory failure.  1.  Acute kidney injury/chronic kidney disease stage IIIB baseline EGFR 38.  Suspect acute kidney injury now related to concurrent illness and dehydration.  Stop D5 half-normal saline and switch to D5W given ongoing hyponatremia.  Recent CT scan of the abdomen pelvis was negative for hydronephrosis.  No immediate need for dialysis at the moment.  2.  Hypernatremia.  Serum sodium high at 147.  Discontinue D5 half-normal  saline and switch to D5W at 100 cc/h.  3.  Anemia of chronic kidney disease with acute worsening.  Hemoglobin down to 6.7.  Recommend blood transfusion but defer to primary team.  4.  Thanks for consultation.

## 2020-09-07 NOTE — Procedures (Signed)
Interventional Radiology Procedure Note  Procedure: CT guided catheter drainage of right pleural effusion  Complications: None  Estimated Blood Loss: None  Findings: 12 Fr pigtail thoracostomy tube placed in right pleural space from anterolateral approach with return of serosanguinous fluid. Sample reserved for any lab testing. Drain attached to [employer].  Jodi Marble. Fredia Sorrow, M.D Pager:  [phone]

## 2020-09-07 NOTE — Progress Notes (Signed)
Pulmonary Critical Care Medicine [facility] [place]   PULMONARY CRITICAL CARE SERVICE  PROGRESS NOTE  Date of Service: 09/07/2020  Eric HamHarl Schnorr  UJW:[id number]  DOB: [birth year]   DOA: 09/07/2020  Referring Physician: Carron CurieAli Hijazi, MD  HPI: Eric Knapp is a 79 y.o. male seen for follow up of Acute on Chronic Respiratory Failure.  Patient remains on the ventilator right now full support currently has been on pressure control mode and requiring 50% FiO2  Medications: Reviewed on Rounds  Physical Exam:  Vitals: Temperature is 97.0 pulse 102 respiratory rate is 22 blood pressure one 5/45 saturations 94%  Ventilator Settings on pressure control FiO2 50% IP 25 PEEP 7  . General: Comfortable at this time . Eyes: Grossly normal lids, irises & conjunctiva . ENT: grossly tongue is normal . Neck: no obvious mass . Cardiovascular: S1 S2 normal no gallop . Respiratory: No rhonchi no rales are noted at this time . Abdomen: soft . Skin: no rash seen on limited exam . Musculoskeletal: not rigid . Psychiatric:unable to assess . Neurologic: no seizure no involuntary movements         Lab Data:   Basic Metabolic Panel: Recent Labs  Lab 09/04/20 1646 09/05/20 1007 09/05/20 1616 09/06/20 1843 09/07/20 0441  NA 148* 148* 148* 144 147*  K 5.4* 6.1* 5.2* 5.3* 4.8  CL 113* 113* 111 108 109  CO2 28 27 26 24 27   GLUCOSE 157* 197* 155* 271* 197*  BUN 106* 109* 117* 123* 130*  CREATININE 1.97* 2.06* 2.33* 2.50* 2.64*  CALCIUM 8.8* 8.6* 8.5* 8.2* 8.3*  MG  --  1.9  --   --   --     ABG: Recent Labs  Lab 09/07/2020 1812 09/05/20 0750  PHART 7.422 7.474*  PCO2ART 44.9 38.6  PO2ART 78.5* 64.9*  HCO3 28.7* 28.0  O2SAT 96.9 93.9    Liver Function Tests: Recent Labs  Lab 09/01/20 0955  AST 45*  ALT 44  ALKPHOS 106  BILITOT 1.1  PROT 6.4*  ALBUMIN 2.1*   No results for input(s): LIPASE, AMYLASE in the last 168 hours. No results for input(s): AMMONIA in the last 168  hours.  CBC: Recent Labs  Lab 09/01/20 0955 09/04/20 0632 09/04/20 1646 09/05/20 0717 09/07/20 0441  WBC 7.7 6.9 6.7 9.6 8.1  HGB 7.6* 6.5* 8.2* 9.0* 6.7*  HCT 25.1* 22.7* 26.8* 29.7* 22.6*  MCV 94.7 94.6 94.4 93.7 93.0  PLT 232 200 221 245 171    Cardiac Enzymes: No results for input(s): CKTOTAL, CKMB, CKMBINDEX, TROPONINI in the last 168 hours.  BNP (last 3 results) Recent Labs    09/04/20 1646 09/05/20 1007  BNP 389.9* 458.9*    ProBNP (last 3 results) No results for input(s): PROBNP in the last 8760 hours.  Radiological Exams: CT ABDOMEN PELVIS WO CONTRAST  Result Date: 09/05/2020 CLINICAL DATA:  Empyema.  Abdominal pain and fever. EXAM: CT CHEST, ABDOMEN AND PELVIS WITHOUT CONTRAST TECHNIQUE: Multidetector CT imaging of the chest, abdomen and pelvis was performed following the standard protocol without IV contrast. COMPARISON:  None. FINDINGS: CT CHEST FINDINGS Cardiovascular: Heart is normal size. Calcified plaque is present over the left main and 3 vessel coronary arteries. Thoracic aorta is normal caliber. There is calcified plaque over the thoracic aorta. Remaining vascular structures are unremarkable. Mediastinum/Nodes: No significant mediastinal or hilar adenopathy. Remaining mediastinal structures are unremarkable. Lungs/Pleura: Tracheostomy tube in adequate position. There is a large right pleural effusion with associated compressive atelectasis in the right  base. There is a loculated component of pleural fluid over the anteromedial aspect of the right mid to upper thorax. 5 mm nodular density over the left upper lobe with possible rim calcification. Airways unremarkable. Musculoskeletal: Multiple old bilateral rib fractures. Degenerative change of the spine. CT ABDOMEN PELVIS FINDINGS Hepatobiliary: Gastrostomy tube in adequate position. Pigtail drainage catheter over the right upper quadrant adjacent the border of the right lobe of the liver. There is a small amount  of fluid and air adjacent the pigtail catheter tip which may represent the gallbladder. Common bile duct stent in adequate position. Minimal pneumobilia over the left lobe of the liver. Pancreas: Normal. Spleen: Normal. Adrenals/Urinary Tract: Adrenal glands are normal. Kidneys at the lower limits of normal in size. No hydronephrosis, nephrolithiasis or focal mass. Ureters are normal. Foley catheter is present within a decompressed bladder. Stomach/Bowel: The stomach is normal. Small bowel is normal. Appendix is not visualized. Colon is normal. Vascular/Lymphatic: Moderate calcified plaque over the abdominal aorta which is normal in caliber. No adenopathy. Reproductive: No focal abnormality. Other: Minimal free fluid over the left pericolic gutter and pelvis. No free peritoneal air or focal inflammatory change. Mild diffuse subcutaneous edema. Musculoskeletal: Moderate degenerative changes of the spine and hips. IMPRESSION: 1. Large right pleural effusion with associated compressive atelectasis in the right base. Loculated component of pleural fluid over the anteromedial aspect of the right mid to upper thorax. Difficult to assess for empyema due to lack of IV contrast. 2. Pigtail drainage catheter over the right upper quadrant adjacent the border of the right lobe of the liver with small amount of fluid and air adjacent the pigtail catheter tip which may represent the gallbladder. Recommend clinical correlation. Common bile duct stent in adequate position. Minimal pneumobilia over the left lobe of the liver. 3. Aortic atherosclerosis. Atherosclerotic coronary artery disease. 4. 5 mm nodule over the left upper lobe. No follow-up needed if patient is low-risk. Non-contrast chest CT can be considered in 12 months if patient is high-risk. This recommendation follows the consensus statement: Guidelines for Management of Incidental Pulmonary Nodules Detected on CT Images: From the Fleischner Society 2017; Radiology 2017;  284:228-243. Aortic Atherosclerosis (ICD10-I70.0). Electronically Signed   By: Elberta Fortis M.D.   On: 09/05/2020 15:51   CT CHEST WO CONTRAST  Result Date: 09/05/2020 CLINICAL DATA:  Empyema.  Abdominal pain and fever. EXAM: CT CHEST, ABDOMEN AND PELVIS WITHOUT CONTRAST TECHNIQUE: Multidetector CT imaging of the chest, abdomen and pelvis was performed following the standard protocol without IV contrast. COMPARISON:  None. FINDINGS: CT CHEST FINDINGS Cardiovascular: Heart is normal size. Calcified plaque is present over the left main and 3 vessel coronary arteries. Thoracic aorta is normal caliber. There is calcified plaque over the thoracic aorta. Remaining vascular structures are unremarkable. Mediastinum/Nodes: No significant mediastinal or hilar adenopathy. Remaining mediastinal structures are unremarkable. Lungs/Pleura: Tracheostomy tube in adequate position. There is a large right pleural effusion with associated compressive atelectasis in the right base. There is a loculated component of pleural fluid over the anteromedial aspect of the right mid to upper thorax. 5 mm nodular density over the left upper lobe with possible rim calcification. Airways unremarkable. Musculoskeletal: Multiple old bilateral rib fractures. Degenerative change of the spine. CT ABDOMEN PELVIS FINDINGS Hepatobiliary: Gastrostomy tube in adequate position. Pigtail drainage catheter over the right upper quadrant adjacent the border of the right lobe of the liver. There is a small amount of fluid and air adjacent the pigtail catheter tip which may represent  the gallbladder. Common bile duct stent in adequate position. Minimal pneumobilia over the left lobe of the liver. Pancreas: Normal. Spleen: Normal. Adrenals/Urinary Tract: Adrenal glands are normal. Kidneys at the lower limits of normal in size. No hydronephrosis, nephrolithiasis or focal mass. Ureters are normal. Foley catheter is present within a decompressed bladder.  Stomach/Bowel: The stomach is normal. Small bowel is normal. Appendix is not visualized. Colon is normal. Vascular/Lymphatic: Moderate calcified plaque over the abdominal aorta which is normal in caliber. No adenopathy. Reproductive: No focal abnormality. Other: Minimal free fluid over the left pericolic gutter and pelvis. No free peritoneal air or focal inflammatory change. Mild diffuse subcutaneous edema. Musculoskeletal: Moderate degenerative changes of the spine and hips. IMPRESSION: 1. Large right pleural effusion with associated compressive atelectasis in the right base. Loculated component of pleural fluid over the anteromedial aspect of the right mid to upper thorax. Difficult to assess for empyema due to lack of IV contrast. 2. Pigtail drainage catheter over the right upper quadrant adjacent the border of the right lobe of the liver with small amount of fluid and air adjacent the pigtail catheter tip which may represent the gallbladder. Recommend clinical correlation. Common bile duct stent in adequate position. Minimal pneumobilia over the left lobe of the liver. 3. Aortic atherosclerosis. Atherosclerotic coronary artery disease. 4. 5 mm nodule over the left upper lobe. No follow-up needed if patient is low-risk. Non-contrast chest CT can be considered in 12 months if patient is high-risk. This recommendation follows the consensus statement: Guidelines for Management of Incidental Pulmonary Nodules Detected on CT Images: From the Fleischner Society 2017; Radiology 2017; 284:228-243. Aortic Atherosclerosis (ICD10-I70.0). Electronically Signed   By: Elberta Fortis M.D.   On: 09/05/2020 15:51   DG Chest Port 1 View  Result Date: 09/06/2020 CLINICAL DATA:  Status post thoracentesis EXAM: PORTABLE CHEST 1 VIEW COMPARISON:  Chest radiograph from one day prior. FINDINGS: Tracheostomy tube tip overlies the tracheal air column at the thoracic inlet. Stable cardiomediastinal silhouette with mild cardiomegaly. No  pneumothorax. Low lung volumes. Small to moderate right pleural effusion appears slightly increased, potentially loculated. No left pleural effusion. Cephalization of the pulmonary vasculature without overt pulmonary edema. Hazy opacity throughout right hemithorax appears worsened. IMPRESSION: 1. No pneumothorax. Small to moderate right pleural effusion appears slightly increased, potentially loculated. 2. Low lung volumes. Hazy opacity throughout the right hemithorax appears worsened, favor atelectasis. Electronically Signed   By: Delbert Phenix M.D.   On: 09/06/2020 11:20   US THORACENTESIS ASP PLEURAL SPACE W/IMG GUIDE  Result Date: 09/06/2020 INDICATION: Patient with history of HF, CAD, anoxic brain injury, and recurrent left pleural effusion. Request is made for diagnostic and therapeutic left thoracentesis. EXAM: ULTRASOUND GUIDED DIAGNOSTIC AND THERAPEUTIC LEFT THORACENTESIS MEDICATIONS: 20 mL 1% lidocaine COMPLICATIONS: None immediate. PROCEDURE: An ultrasound guided thoracentesis was thoroughly discussed with the patient and questions answered. The benefits, risks, alternatives and complications were also discussed. The patient's daughter understands and wishes to proceed with the procedure. Written consent was obtained. Ultrasound was performed to localize and mark an adequate pocket of fluid in the left chest. The area was then prepped and draped in the normal sterile fashion. 1% Lidocaine was used for local anesthesia. Under ultrasound guidance a 6 Fr Safe-T-Centesis catheter was introduced, however revealed a dry tap. 1% lidocaine was used for local anesthesia for rib space below. Again, under ultrasound guidance a 6 French Safe-T-Centesis catheter was introduced, again revealing a dry tap. Procedure was aborted- the catheter was removed  and a dressing applied. Postprocedural chest x-ray was ordered following attempted procedure. FINDINGS: Complex and loculated honeycomb appearing left pleural  effusion s/p attempted thoracentesis revealing a dry tap. IMPRESSION: Attempted thoracentesis unsuccessful secondary to dry tap. Procedure was aborted. Read by: Elwin Mocha, PA-C Electronically Signed   By: Simonne Come M.D.   On: 09/06/2020 11:34    Assessment/Plan Active Problems:   Acute on chronic respiratory failure with hypoxia (HCC)   Chronic combined systolic and diastolic heart failure (HCC)   COPD, severe (HCC)   Chronic atrial fibrillation (HCC)   Metabolic encephalopathy   Pleural effusion, right   1. Acute on chronic respiratory failure with hypoxia the plan is to continue with the wean protocol try pressure support again today 2. Chronic combined systolic diastolic heart failure supportive care patient is also on dialysis which is on hold being followed by nephrology. 3. Severe COPD medical management continue to follow 4. Chronic atrial fibrillation rate is controlled 5. Pleural effusion right side had a attempted thoracentesis secondary to a dry tap was not completed will interventional radiology review CT consider drainage 6. Metabolic encephalopathy no change we will continue with supportive care   I have personally seen and evaluated the patient, evaluated laboratory and imaging results, formulated the assessment and plan and placed orders. The Patient requires high complexity decision making with multiple systems involvement.  Rounds were done with the Respiratory Therapy Director and Staff therapists and discussed with nursing staff also.  Yevonne Pax, MD [facility] Pulmonary Critical Care Medicine Sleep Medicine

## 2020-09-08 DIAGNOSIS — J9621 Acute and chronic respiratory failure with hypoxia: Secondary | ICD-10-CM | POA: Diagnosis not present

## 2020-09-08 DIAGNOSIS — I482 Chronic atrial fibrillation, unspecified: Secondary | ICD-10-CM | POA: Diagnosis not present

## 2020-09-08 DIAGNOSIS — J449 Chronic obstructive pulmonary disease, unspecified: Secondary | ICD-10-CM | POA: Diagnosis not present

## 2020-09-08 DIAGNOSIS — I5042 Chronic combined systolic (congestive) and diastolic (congestive) heart failure: Secondary | ICD-10-CM | POA: Diagnosis not present

## 2020-09-08 LAB — TYPE AND SCREEN
ABO/RH(D): O POS
Antibody Screen: NEGATIVE
Unit division: 0
Unit division: 0

## 2020-09-08 LAB — RENAL FUNCTION PANEL
Albumin: 1.9 g/dL — ABNORMAL LOW (ref 3.5–5.0)
Anion gap: 11 (ref 5–15)
BUN: 146 mg/dL — ABNORMAL HIGH (ref 8–23)
CO2: 28 mmol/L (ref 22–32)
Calcium: 8.2 mg/dL — ABNORMAL LOW (ref 8.9–10.3)
Chloride: 107 mmol/L (ref 98–111)
Creatinine, Ser: 2.65 mg/dL — ABNORMAL HIGH (ref 0.61–1.24)
GFR calc Af Amer: 26 mL/min — ABNORMAL LOW (ref >60)
GFR calc non Af Amer: 22 mL/min — ABNORMAL LOW (ref >60)
Glucose, Bld: 200 mg/dL — ABNORMAL HIGH (ref 70–99)
Phosphorus: 4.1 mg/dL (ref 2.5–4.6)
Potassium: 4.6 mmol/L (ref 3.5–5.1)
Sodium: 146 mmol/L — ABNORMAL HIGH (ref 135–145)

## 2020-09-08 LAB — CBC
HCT: 27.9 % — ABNORMAL LOW (ref 39.0–52.0)
HCT: 26.8 % — ABNORMAL LOW (ref 39.0–52.0)
Hemoglobin: 8.2 g/dL — ABNORMAL LOW (ref 13.0–17.0)
Hemoglobin: 7.9 g/dL — ABNORMAL LOW (ref 13.0–17.0)
MCH: 27.2 pg (ref 26.0–34.0)
MCH: 27.1 pg (ref 26.0–34.0)
MCHC: 29.4 g/dL — ABNORMAL LOW (ref 30.0–36.0)
MCHC: 29.5 g/dL — ABNORMAL LOW (ref 30.0–36.0)
MCV: 92.7 fL (ref 80.0–100.0)
MCV: 91.8 fL (ref 80.0–100.0)
Platelets: 180 10*3/uL (ref 150–400)
Platelets: 172 10*3/uL (ref 150–400)
RBC: 3.01 MIL/uL — ABNORMAL LOW (ref 4.22–5.81)
RBC: 2.92 MIL/uL — ABNORMAL LOW (ref 4.22–5.81)
RDW: 15.9 % — ABNORMAL HIGH (ref 11.5–15.5)
RDW: 15.7 % — ABNORMAL HIGH (ref 11.5–15.5)
WBC: 6.8 10*3/uL (ref 4.0–10.5)
WBC: 6.4 10*3/uL (ref 4.0–10.5)
nRBC: 0 % (ref 0.0–0.2)
nRBC: 0 % (ref 0.0–0.2)

## 2020-09-08 LAB — BPAM RBC
Blood Product Expiration Date: 202110162359
Blood Product Expiration Date: 202110232359
ISSUE DATE / TIME: 202109171341
ISSUE DATE / TIME: 202109201634
Unit Type and Rh: 5100
Unit Type and Rh: 5100

## 2020-09-08 NOTE — Progress Notes (Signed)
Pulmonary Critical Care Medicine [facility] [place]   PULMONARY CRITICAL CARE SERVICE  PROGRESS NOTE  Date of Service: 09/08/2020  Eric Knapp  WYO:[id number]  DOB: January 20, 1941   DOA: 2020/09/01  Referring Physician: Carron Curie, MD  HPI: Eric Knapp is a 79 y.o. male seen for follow up of Acute on Chronic Respiratory Failure.  Patient currently is on pressure control mode has been on 40% FiO2 today with an IP of 25 appears to be tolerating it well  Medications: Reviewed on Rounds  Physical Exam:  Vitals: Temperature is 98.9 pulse 88 respiratory rate 20 blood pressure is 109/52 saturations 100%  Ventilator Settings on pressure assist control FiO2 40% IP 25 PEEP 7  . General: Comfortable at this time . Eyes: Grossly normal lids, irises & conjunctiva . ENT: grossly tongue is normal . Neck: no obvious mass . Cardiovascular: S1 S2 normal no gallop . Respiratory: No rhonchi coarse breath sounds . Abdomen: soft . Skin: no rash seen on limited exam . Musculoskeletal: not rigid . Psychiatric:unable to assess . Neurologic: no seizure no involuntary movements         Lab Data:   Basic Metabolic Panel: Recent Labs  Lab 09/05/20 1007 09/05/20 1616 09/06/20 1843 09/07/20 0441 09/08/20 0914  NA 148* 148* 144 147* 146*  K 6.1* 5.2* 5.3* 4.8 4.6  CL 113* 111 108 109 107  CO2 27 26 24 27 28   GLUCOSE 197* 155* 271* 197* 200*  BUN 109* 117* 123* 130* 146*  CREATININE 2.06* 2.33* 2.50* 2.64* 2.65*  CALCIUM 8.6* 8.5* 8.2* 8.3* 8.2*  MG 1.9  --   --   --   --   PHOS  --   --   --   --  4.1    ABG: Recent Labs  Lab 09/05/20 0750  PHART 7.474*  PCO2ART 38.6  PO2ART 64.9*  HCO3 28.0  O2SAT 93.9    Liver Function Tests: Recent Labs  Lab 09/08/20 0914  ALBUMIN 1.9*   No results for input(s): LIPASE, AMYLASE in the last 168 hours. No results for input(s): AMMONIA in the last 168 hours.  CBC: Recent Labs  Lab 09/05/20 0717 09/07/20 0441 09/07/20 2128  09/08/20 0914 09/08/20 1416  WBC 9.6 8.1 8.7 6.8 6.4  HGB 9.0* 6.7* 8.6* 8.2* 7.9*  HCT 29.7* 22.6* 28.3* 27.9* 26.8*  MCV 93.7 93.0 91.3 92.7 91.8  PLT 245 171 175 180 172    Cardiac Enzymes: No results for input(s): CKTOTAL, CKMB, CKMBINDEX, TROPONINI in the last 168 hours.  BNP (last 3 results) Recent Labs    09/04/20 1646 09/05/20 1007  BNP 389.9* 458.9*    ProBNP (last 3 results) No results for input(s): PROBNP in the last 8760 hours.  Radiological Exams: DG Abd 1 View  Result Date: 09/07/2020 CLINICAL DATA:  Femoral line placement EXAM: ABDOMEN - 1 VIEW COMPARISON:  None. FINDINGS: There is a left femoral catheter projecting over the patient's pelvis and abdomen. 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 The tip of the catheter is not readily visualized on this study. A biliary stent is noted projecting over the right upper quadrant. IMPRESSION: 1. Newly placed left-sided central venous catheter likely courses through the patient's duplicated IVC. The tip is not reliably visualized on this study. 2. Nonobstructive bowel gas pattern. Electronically Signed   By: 09/09/2020 M.D.   On: 09/07/2020 18:54   DG CHEST PORT  1 VIEW  Result Date: 09/07/2020 CLINICAL DATA:  Shortness of breath EXAM: PORTABLE CHEST 1 VIEW COMPARISON:  09/06/2020 FINDINGS: There has been interval placement of a right-sided chest tube. There is significant interval decrease in size of the right-sided pleural effusion. There is improved aeration throughout the right lung field. No pneumothorax. The tracheostomy tube is stable. The cardiac silhouette is unchanged. The left lung field is essentially clear aside from some bibasilar atelectasis and small left-sided pleural effusion. IMPRESSION: Interval placement of a right-sided chest tube with significant interval decrease in size of the right-sided pleural effusion. No  pneumothorax. Electronically Signed   By: Katherine Mantle M.D.   On: 09/07/2020 18:53   CT IMAGE GUIDED DRAINAGE BY PERCUTANEOUS CATHETER  Result Date: 09/07/2020 CLINICAL DATA:  Respiratory failure and recurrent loculated right pleural effusion. EXAM: CT GUIDED PLACEMENT RIGHT PLEURAL DRAINAGE CATHETER ANESTHESIA/SEDATION: 0.5 mg IV Versed Total Moderate Sedation Time:  18 minutes. The patient's level of consciousness and physiologic status were continuously monitored during the procedure by Radiology nursing. PROCEDURE: The procedure, risks, benefits, and alternatives were explained to the patient's daughter. Questions regarding the procedure were encouraged and answered. The patient's daughter understands and consents to the procedure. A time out was performed prior to initiating the procedure. The right anterolateral chest wall was prepped with chlorhexidine in a sterile fashion, and a sterile drape was applied covering the operative field. A sterile gown and sterile gloves were used for the procedure. Local anesthesia was provided with 1% Lidocaine. Under CT guidance, an 18 gauge trocar needle was advanced into the right pleural space via a lower anterolateral intercostal space. After return of fluid, a guidewire was advanced into the pleural space. The tract was dilated to 85 [place] and a 12 French pigtail drainage catheter advanced. Catheter position was confirmed by CT. A fluid sample was withdrawn for culture analysis. The tube was then connected to a Pleur-evac device. The catheter was secured at the skin with a Prolene retention suture and StatLock device. COMPLICATIONS: None FINDINGS: Very large right pleural effusion remains which appears partly loculated. After needle placement, there was return of serosanguineous fluid. After pigtail drainage catheter placement in the right pleural space, there is rapid return of fluid. IMPRESSION: CT-guided placement of 12 French pigtail drainage catheter into  the right pleural space. There is return of serosanguineous fluid. The drain was connected to a [place] Pleur-evac device which will be connected to wall suction at -20 cm of water. Electronically Signed   By: Irish Lack M.D.   On: 09/07/2020 16:21    Assessment/Plan Active Problems:   Acute on chronic respiratory failure with hypoxia (HCC)   Chronic combined systolic and diastolic heart failure (HCC)   COPD, severe (HCC)   Chronic atrial fibrillation (HCC)   Metabolic encephalopathy   Pleural effusion, right   1. Acute on chronic respiratory failure with hypoxia patient was seen by radiology today had a chest tube placed yesterday for pleural effusion I reviewed the chest x-ray we will continue to monitor the drainage. 2. Chronic combined systolic diastolic heart failure monitor fluid status.  Diuretics as necessary. 3. Chronic atrial fibrillation rate is controlled at this time we will continue to follow 4. Encephalopathy no change 5. Pleural effusion on the right side status post chest tube placement   I have personally seen and evaluated the patient, evaluated laboratory and imaging results, formulated the assessment and plan and placed orders. The Patient requires high complexity decision making with multiple systems  involvement.  Rounds were done with the Respiratory Therapy Director and Staff therapists and discussed with nursing staff also.  Allyne Gee, MD [facility] Pulmonary Critical Care Medicine Sleep Medicine

## 2020-09-08 NOTE — Progress Notes (Signed)
Referring Physician(s): Dr. Manson PasseyBrown  Supervising Physician: Dr. Elby ShowersSuttle  Patient Status:  [facility]  Chief Complaint: Acute on chronic respiratory failure; loculated right pleural effusion; s/p right chest tube placement 09/07/20 by Dr. Fredia SorrowYamagata.  Subjective: Patient in bed with eyes open but unresponsive to verbal stimuli. Tracheostomy/ventilator. Patient does not appear in discomfort or distress. [facility]; over 2L of serosanguineous fluid in collection container.   Allergies: Patient has no allergy information on record.  Medications: Prior to Admission medications   Not on File     Vital Signs: BP (!) 92/39 (BP Location: Right Arm)   Pulse 82   Resp 12   SpO2 100%   Physical Exam Constitutional:      Comments: Eyes open; no meaningful response observed. Tracheostomy/ventilator.   Cardiovascular:     Rate and Rhythm: Normal rate and regular rhythm.  Pulmonary:     Effort: Pulmonary effort is normal.     Comments: Tracheostomy/ventilator. Right chest tube intact. Approximately 2L of serosanguineous fluid in collection container. Dressing is clean/dry. No air leak observed.  Skin:    General: Skin is warm and dry.     Imaging: CT ABDOMEN PELVIS WO CONTRAST  Result Date: 09/05/2020 CLINICAL DATA:  Empyema.  Abdominal pain and fever. EXAM: CT CHEST, ABDOMEN AND PELVIS WITHOUT CONTRAST TECHNIQUE: Multidetector CT imaging of the chest, abdomen and pelvis was performed following the standard protocol without IV contrast. COMPARISON:  None. FINDINGS: CT CHEST FINDINGS Cardiovascular: Heart is normal size. Calcified plaque is present over the left main and 3 vessel coronary arteries. Thoracic aorta is normal caliber. There is calcified plaque over the thoracic aorta. Remaining vascular structures are unremarkable. Mediastinum/Nodes: No significant mediastinal or hilar adenopathy. Remaining mediastinal structures are unremarkable. Lungs/Pleura:  Tracheostomy tube in adequate position. There is a large right pleural effusion with associated compressive atelectasis in the right base. There is a loculated component of pleural fluid over the anteromedial aspect of the right mid to upper thorax. 5 mm nodular density over the left upper lobe with possible rim calcification. Airways unremarkable. Musculoskeletal: Multiple old bilateral rib fractures. Degenerative change of the spine. CT ABDOMEN PELVIS FINDINGS Hepatobiliary: Gastrostomy tube in adequate position. Pigtail drainage catheter over the right upper quadrant adjacent the border of the right lobe of the liver. There is a small amount of fluid and air adjacent the pigtail catheter tip which may represent the gallbladder. Common bile duct stent in adequate position. Minimal pneumobilia over the left lobe of the liver. Pancreas: Normal. Spleen: Normal. Adrenals/Urinary Tract: Adrenal glands are normal. Kidneys at the lower limits of normal in size. No hydronephrosis, nephrolithiasis or focal mass. Ureters are normal. Foley catheter is present within a decompressed bladder. Stomach/Bowel: The stomach is normal. Small bowel is normal. Appendix is not visualized. Colon is normal. Vascular/Lymphatic: Moderate calcified plaque over the abdominal aorta which is normal in caliber. No adenopathy. Reproductive: No focal abnormality. Other: Minimal free fluid over the left pericolic gutter and pelvis. No free peritoneal air or focal inflammatory change. Mild diffuse subcutaneous edema. Musculoskeletal: Moderate degenerative changes of the spine and hips. IMPRESSION: 1. Large right pleural effusion with associated compressive atelectasis in the right base. Loculated component of pleural fluid over the anteromedial aspect of the right mid to upper thorax. Difficult to assess for empyema due to lack of IV contrast. 2. Pigtail drainage catheter over the right upper quadrant adjacent the border of the right lobe of the  liver with  small amount of fluid and air adjacent the pigtail catheter tip which may represent the gallbladder. Recommend clinical correlation. Common bile duct stent in adequate position. Minimal pneumobilia over the left lobe of the liver. 3. Aortic atherosclerosis. Atherosclerotic coronary artery disease. 4. 5 mm nodule over the left upper lobe. No follow-up needed if patient is low-risk. Non-contrast chest CT can be considered in 12 months if patient is high-risk. This recommendation follows the consensus statement: Guidelines for Management of Incidental Pulmonary Nodules Detected on CT Images: From the Fleischner Society 2017; Radiology 2017; 284:228-243. Aortic Atherosclerosis (ICD10-I70.0). Electronically Signed   By: Elberta Fortis M.D.   On: 09/05/2020 15:51   DG Abd 1 View  Result Date: 09/07/2020 CLINICAL DATA:  Femoral line placement EXAM: ABDOMEN - 1 VIEW COMPARISON:  None. FINDINGS: There is a left femoral catheter projecting over the patient's pelvis and abdomen. 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 The tip of the catheter is not readily visualized on this study. A biliary stent is noted projecting over the right upper quadrant. IMPRESSION: 1. Newly placed left-sided central venous catheter likely courses through the patient's duplicated IVC. The tip is not reliably visualized on this study. 2. Nonobstructive bowel gas pattern. Electronically Signed   By: Katherine Mantle M.D.   On: 09/07/2020 18:54   CT CHEST WO CONTRAST  Result Date: 09/05/2020 CLINICAL DATA:  Empyema.  Abdominal pain and fever. EXAM: CT CHEST, ABDOMEN AND PELVIS WITHOUT CONTRAST TECHNIQUE: Multidetector CT imaging of the chest, abdomen and pelvis was performed following the standard protocol without IV contrast. COMPARISON:  None. FINDINGS: CT CHEST FINDINGS Cardiovascular: Heart is normal size. Calcified plaque is present over the  left main and 3 vessel coronary arteries. Thoracic aorta is normal caliber. There is calcified plaque over the thoracic aorta. Remaining vascular structures are unremarkable. Mediastinum/Nodes: No significant mediastinal or hilar adenopathy. Remaining mediastinal structures are unremarkable. Lungs/Pleura: Tracheostomy tube in adequate position. There is a large right pleural effusion with associated compressive atelectasis in the right base. There is a loculated component of pleural fluid over the anteromedial aspect of the right mid to upper thorax. 5 mm nodular density over the left upper lobe with possible rim calcification. Airways unremarkable. Musculoskeletal: Multiple old bilateral rib fractures. Degenerative change of the spine. CT ABDOMEN PELVIS FINDINGS Hepatobiliary: Gastrostomy tube in adequate position. Pigtail drainage catheter over the right upper quadrant adjacent the border of the right lobe of the liver. There is a small amount of fluid and air adjacent the pigtail catheter tip which may represent the gallbladder. Common bile duct stent in adequate position. Minimal pneumobilia over the left lobe of the liver. Pancreas: Normal. Spleen: Normal. Adrenals/Urinary Tract: Adrenal glands are normal. Kidneys at the lower limits of normal in size. No hydronephrosis, nephrolithiasis or focal mass. Ureters are normal. Foley catheter is present within a decompressed bladder. Stomach/Bowel: The stomach is normal. Small bowel is normal. Appendix is not visualized. Colon is normal. Vascular/Lymphatic: Moderate calcified plaque over the abdominal aorta which is normal in caliber. No adenopathy. Reproductive: No focal abnormality. Other: Minimal free fluid over the left pericolic gutter and pelvis. No free peritoneal air or focal inflammatory change. Mild diffuse subcutaneous edema. Musculoskeletal: Moderate degenerative changes of the spine and hips. IMPRESSION: 1. Large right pleural effusion with associated  compressive atelectasis in the right base. Loculated component of pleural fluid over the anteromedial aspect of the right mid to  upper thorax. Difficult to assess for empyema due to lack of IV contrast. 2. Pigtail drainage catheter over the right upper quadrant adjacent the border of the right lobe of the liver with small amount of fluid and air adjacent the pigtail catheter tip which may represent the gallbladder. Recommend clinical correlation. Common bile duct stent in adequate position. Minimal pneumobilia over the left lobe of the liver. 3. Aortic atherosclerosis. Atherosclerotic coronary artery disease. 4. 5 mm nodule over the left upper lobe. No follow-up needed if patient is low-risk. Non-contrast chest CT can be considered in 12 months if patient is high-risk. This recommendation follows the consensus statement: Guidelines for Management of Incidental Pulmonary Nodules Detected on CT Images: From the Fleischner Society 2017; Radiology 2017; 284:228-243. Aortic Atherosclerosis (ICD10-I70.0). Electronically Signed   By: Elberta Fortis M.D.   On: 09/05/2020 15:51   DG CHEST PORT 1 VIEW  Result Date: 09/07/2020 CLINICAL DATA:  Shortness of breath EXAM: PORTABLE CHEST 1 VIEW COMPARISON:  09/06/2020 FINDINGS: There has been interval placement of a right-sided chest tube. There is significant interval decrease in size of the right-sided pleural effusion. There is improved aeration throughout the right lung field. No pneumothorax. The tracheostomy tube is stable. The cardiac silhouette is unchanged. The left lung field is essentially clear aside from some bibasilar atelectasis and small left-sided pleural effusion. IMPRESSION: Interval placement of a right-sided chest tube with significant interval decrease in size of the right-sided pleural effusion. No pneumothorax. Electronically Signed   By: Katherine Mantle M.D.   On: 09/07/2020 18:53   DG Chest Port 1 View  Result Date: 09/06/2020 CLINICAL DATA:   Status post thoracentesis EXAM: PORTABLE CHEST 1 VIEW COMPARISON:  Chest radiograph from one day prior. FINDINGS: Tracheostomy tube tip overlies the tracheal air column at the thoracic inlet. Stable cardiomediastinal silhouette with mild cardiomegaly. No pneumothorax. Low lung volumes. Small to moderate right pleural effusion appears slightly increased, potentially loculated. No left pleural effusion. Cephalization of the pulmonary vasculature without overt pulmonary edema. Hazy opacity throughout right hemithorax appears worsened. IMPRESSION: 1. No pneumothorax. Small to moderate right pleural effusion appears slightly increased, potentially loculated. 2. Low lung volumes. Hazy opacity throughout the right hemithorax appears worsened, favor atelectasis. Electronically Signed   By: Delbert Phenix M.D.   On: 09/06/2020 11:20   DG Chest Port 1 View  Result Date: 09/05/2020 CLINICAL DATA:  Evaluate tracheostomy tube position. EXAM: PORTABLE CHEST 1 VIEW COMPARISON:  09/02/2020 FINDINGS: The tracheostomy tube tip is stable above the carina. Unchanged cardiomediastinal contours. Moderate right pleural effusion is identified. Unchanged veil like opacification of the entire right lung. Left lung appears clear. IMPRESSION: 1. Stable tracheostomy tube position. 2. Persistent right pleural effusion and with veil like opacification of the entire right lung. Electronically Signed   By: Signa Kell M.D.   On: 09/05/2020 08:56   CT IMAGE GUIDED DRAINAGE BY PERCUTANEOUS CATHETER  Result Date: 09/07/2020 CLINICAL DATA:  Respiratory failure and recurrent loculated right pleural effusion. EXAM: CT GUIDED PLACEMENT RIGHT PLEURAL DRAINAGE CATHETER ANESTHESIA/SEDATION: 0.5 mg IV Versed Total Moderate Sedation Time:  18 minutes. The patient's level of consciousness and physiologic status were continuously monitored during the procedure by Radiology nursing. PROCEDURE: The procedure, risks, benefits, and alternatives were  explained to the patient's daughter. Questions regarding the procedure were encouraged and answered. The patient's daughter understands and consents to the procedure. A time out was performed prior to initiating the procedure. The right anterolateral chest wall was prepped with chlorhexidine  in a sterile fashion, and a sterile drape was applied covering the operative field. A sterile gown and sterile gloves were used for the procedure. Local anesthesia was provided with 1% Lidocaine. Under CT guidance, an 18 gauge trocar needle was advanced into the right pleural space via a lower anterolateral intercostal space. After return of fluid, a guidewire was advanced into the pleural space. The tract was dilated to 61 [place] and a 12 French pigtail drainage catheter advanced. Catheter position was confirmed by CT. A fluid sample was withdrawn for culture analysis. The tube was then connected to a Pleur-evac device. The catheter was secured at the skin with a Prolene retention suture and StatLock device. COMPLICATIONS: None FINDINGS: Very large right pleural effusion remains which appears partly loculated. After needle placement, there was return of serosanguineous fluid. After pigtail drainage catheter placement in the right pleural space, there is rapid return of fluid. IMPRESSION: CT-guided placement of 12 French pigtail drainage catheter into the right pleural space. There is return of serosanguineous fluid. The drain was connected to a [place] Pleur-evac device which will be connected to wall suction at -20 cm of water. Electronically Signed   By: Irish Lack M.D.   On: 09/07/2020 16:21   US THORACENTESIS ASP PLEURAL SPACE W/IMG GUIDE  Result Date: 09/06/2020 INDICATION: Patient with history of HF, CAD, anoxic brain injury, and recurrent left pleural effusion. Request is made for diagnostic and therapeutic left thoracentesis. EXAM: ULTRASOUND GUIDED DIAGNOSTIC AND THERAPEUTIC LEFT THORACENTESIS MEDICATIONS: 20  mL 1% lidocaine COMPLICATIONS: None immediate. PROCEDURE: An ultrasound guided thoracentesis was thoroughly discussed with the patient and questions answered. The benefits, risks, alternatives and complications were also discussed. The patient's daughter understands and wishes to proceed with the procedure. Written consent was obtained. Ultrasound was performed to localize and mark an adequate pocket of fluid in the left chest. The area was then prepped and draped in the normal sterile fashion. 1% Lidocaine was used for local anesthesia. Under ultrasound guidance a 6 Fr Safe-T-Centesis catheter was introduced, however revealed a dry tap. 1% lidocaine was used for local anesthesia for rib space below. Again, under ultrasound guidance a 6 French Safe-T-Centesis catheter was introduced, again revealing a dry tap. Procedure was aborted- the catheter was removed and a dressing applied. Postprocedural chest x-ray was ordered following attempted procedure. FINDINGS: Complex and loculated honeycomb appearing left pleural effusion s/p attempted thoracentesis revealing a dry tap. IMPRESSION: Attempted thoracentesis unsuccessful secondary to dry tap. Procedure was aborted. Read by: Elwin Mocha, PA-C Electronically Signed   By: Simonne Come M.D.   On: 09/06/2020 11:34    Labs:  CBC: Recent Labs    09/07/20 0441 09/07/20 2128 09/08/20 0914 09/08/20 1416  WBC 8.1 8.7 6.8 6.4  HGB 6.7* 8.6* 8.2* 7.9*  HCT 22.6* 28.3* 27.9* 26.8*  PLT 171 175 180 172    COAGS: Recent Labs    09/01/20 0955  INR 1.7*    BMP: Recent Labs    09/05/20 1616 09/06/20 1843 09/07/20 0441 09/08/20 0914  NA 148* 144 147* 146*  K 5.2* 5.3* 4.8 4.6  CL 111 108 109 107  CO2 26 24 27 28   GLUCOSE 155* 271* 197* 200*  BUN 117* 123* 130* 146*  CALCIUM 8.5* 8.2* 8.3* 8.2*  CREATININE 2.33* 2.50* 2.64* 2.65*  GFRNONAA 26* 24* 23* 22*  GFRAA 30* 28* 26* 26*    LIVER FUNCTION TESTS: Recent Labs    09/01/20 0955  09/08/20 0914  BILITOT 1.1  --  AST 45*  --   ALT 44  --   ALKPHOS 106  --   PROT 6.4*  --   ALBUMIN 2.1* 1.9*    Assessment and Plan:  Loculated right pleural effusion; s/p right chest tube: Tube is to low wall suction, no air leak observed. Patient still requiring 7 of PEEP on the ventilator for adequate oxygenation. CXR post-procedure showed significant interval decrease in the size of the effusion.   IR will continue to follow.   Electronically Signed: Alwyn Ren, AGACNP-BC [phone] 09/08/2020, 4:54 PM   I spent a total of 15 Minutes at the the patient's bedside AND on the patient's hospital floor or unit, greater than 50% of which was counseling/coordinating care for right chest tube.

## 2020-09-09 DIAGNOSIS — I5042 Chronic combined systolic (congestive) and diastolic (congestive) heart failure: Secondary | ICD-10-CM | POA: Diagnosis not present

## 2020-09-09 DIAGNOSIS — I482 Chronic atrial fibrillation, unspecified: Secondary | ICD-10-CM | POA: Diagnosis not present

## 2020-09-09 DIAGNOSIS — J449 Chronic obstructive pulmonary disease, unspecified: Secondary | ICD-10-CM | POA: Diagnosis not present

## 2020-09-09 DIAGNOSIS — J9621 Acute and chronic respiratory failure with hypoxia: Secondary | ICD-10-CM | POA: Diagnosis not present

## 2020-09-09 LAB — RENAL FUNCTION PANEL
Albumin: 1.8 g/dL — ABNORMAL LOW (ref 3.5–5.0)
Anion gap: 13 (ref 5–15)
BUN: 147 mg/dL — ABNORMAL HIGH (ref 8–23)
CO2: 29 mmol/L (ref 22–32)
Calcium: 8.4 mg/dL — ABNORMAL LOW (ref 8.9–10.3)
Chloride: 109 mmol/L (ref 98–111)
Creatinine, Ser: 2.43 mg/dL — ABNORMAL HIGH (ref 0.61–1.24)
GFR calc Af Amer: 29 mL/min — ABNORMAL LOW (ref >60)
GFR calc non Af Amer: 25 mL/min — ABNORMAL LOW (ref >60)
Glucose, Bld: 150 mg/dL — ABNORMAL HIGH (ref 70–99)
Phosphorus: 3 mg/dL (ref 2.5–4.6)
Potassium: 4.8 mmol/L (ref 3.5–5.1)
Sodium: 151 mmol/L — ABNORMAL HIGH (ref 135–145)

## 2020-09-09 NOTE — Progress Notes (Signed)
Referring Physician(s): Dr. Manson Passey  Supervising Physician: Simonne Come  Patient Status:  [facility], in-patient  Chief Complaint:  Acute on chronic respiratory failure; loculated right pleural effusion; s/p right chest tube placement 09/07/20 by Dr. Fredia Sorrow.  Subjective:  Patient resting comfortably. Makes eye contact but does not interact or follow commands purposefully.  R chest tube in place with ongoing bloody output.    Allergies: Patient has no allergy information on record.  Medications: Prior to Admission medications   Not on File     Vital Signs: BP (!) 92/39 (BP Location: Right Arm)   Pulse 82   Resp 12   SpO2 100%   Physical Exam Constitutional:      Comments: Eyes open; no meaningful response observed. Tracheostomy/ventilator.   Pulmonary:     Effort: Pulmonary effort is normal.     Comments: Tracheostomy/ventilator. Right chest tube intact. New PleurEvac container noted with 300 mL output.  No air leak.  To suction.  Skin:    General: Skin is warm and dry.     Imaging: CT ABDOMEN PELVIS WO CONTRAST  Result Date: 09/05/2020 CLINICAL DATA:  Empyema.  Abdominal pain and fever. EXAM: CT CHEST, ABDOMEN AND PELVIS WITHOUT CONTRAST TECHNIQUE: Multidetector CT imaging of the chest, abdomen and pelvis was performed following the standard protocol without IV contrast. COMPARISON:  None. FINDINGS: CT CHEST FINDINGS Cardiovascular: Heart is normal size. Calcified plaque is present over the left main and 3 vessel coronary arteries. Thoracic aorta is normal caliber. There is calcified plaque over the thoracic aorta. Remaining vascular structures are unremarkable. Mediastinum/Nodes: No significant mediastinal or hilar adenopathy. Remaining mediastinal structures are unremarkable. Lungs/Pleura: Tracheostomy tube in adequate position. There is a large right pleural effusion with associated compressive atelectasis in the right base. There is a loculated  component of pleural fluid over the anteromedial aspect of the right mid to upper thorax. 5 mm nodular density over the left upper lobe with possible rim calcification. Airways unremarkable. Musculoskeletal: Multiple old bilateral rib fractures. Degenerative change of the spine. CT ABDOMEN PELVIS FINDINGS Hepatobiliary: Gastrostomy tube in adequate position. Pigtail drainage catheter over the right upper quadrant adjacent the border of the right lobe of the liver. There is a small amount of fluid and air adjacent the pigtail catheter tip which may represent the gallbladder. Common bile duct stent in adequate position. Minimal pneumobilia over the left lobe of the liver. Pancreas: Normal. Spleen: Normal. Adrenals/Urinary Tract: Adrenal glands are normal. Kidneys at the lower limits of normal in size. No hydronephrosis, nephrolithiasis or focal mass. Ureters are normal. Foley catheter is present within a decompressed bladder. Stomach/Bowel: The stomach is normal. Small bowel is normal. Appendix is not visualized. Colon is normal. Vascular/Lymphatic: Moderate calcified plaque over the abdominal aorta which is normal in caliber. No adenopathy. Reproductive: No focal abnormality. Other: Minimal free fluid over the left pericolic gutter and pelvis. No free peritoneal air or focal inflammatory change. Mild diffuse subcutaneous edema. Musculoskeletal: Moderate degenerative changes of the spine and hips. IMPRESSION: 1. Large right pleural effusion with associated compressive atelectasis in the right base. Loculated component of pleural fluid over the anteromedial aspect of the right mid to upper thorax. Difficult to assess for empyema due to lack of IV contrast. 2. Pigtail drainage catheter over the right upper quadrant adjacent the border of the right lobe of the liver with small amount of fluid and air adjacent the pigtail catheter tip which may represent the gallbladder. Recommend clinical correlation. Common  bile duct  stent in adequate position. Minimal pneumobilia over the left lobe of the liver. 3. Aortic atherosclerosis. Atherosclerotic coronary artery disease. 4. 5 mm nodule over the left upper lobe. No follow-up needed if patient is low-risk. Non-contrast chest CT can be considered in 12 months if patient is high-risk. This recommendation follows the consensus statement: Guidelines for Management of Incidental Pulmonary Nodules Detected on CT Images: From the Fleischner Society 2017; Radiology 2017; 284:228-243. Aortic Atherosclerosis (ICD10-I70.0). Electronically Signed   By: Elberta Fortisaniel  Boyle M.D.   On: 09/05/2020 15:51   DG Abd 1 View  Result Date: 09/07/2020 CLINICAL DATA:  Femoral line placement EXAM: ABDOMEN - 1 VIEW COMPARISON:  None. FINDINGS: There is a left femoral catheter projecting over the patient's pelvis and abdomen. 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 The tip of the catheter is not readily visualized on this study. A biliary stent is noted projecting over the right upper quadrant. IMPRESSION: 1. Newly placed left-sided central venous catheter likely courses through the patient's duplicated IVC. The tip is not reliably visualized on this study. 2. Nonobstructive bowel gas pattern. Electronically Signed   By: Katherine Mantlehristopher  Green M.D.   On: 09/07/2020 18:54   CT CHEST WO CONTRAST  Result Date: 09/05/2020 CLINICAL DATA:  Empyema.  Abdominal pain and fever. EXAM: CT CHEST, ABDOMEN AND PELVIS WITHOUT CONTRAST TECHNIQUE: Multidetector CT imaging of the chest, abdomen and pelvis was performed following the standard protocol without IV contrast. COMPARISON:  None. FINDINGS: CT CHEST FINDINGS Cardiovascular: Heart is normal size. Calcified plaque is present over the left main and 3 vessel coronary arteries. Thoracic aorta is normal caliber. There is calcified plaque over the thoracic aorta. Remaining vascular structures are  unremarkable. Mediastinum/Nodes: No significant mediastinal or hilar adenopathy. Remaining mediastinal structures are unremarkable. Lungs/Pleura: Tracheostomy tube in adequate position. There is a large right pleural effusion with associated compressive atelectasis in the right base. There is a loculated component of pleural fluid over the anteromedial aspect of the right mid to upper thorax. 5 mm nodular density over the left upper lobe with possible rim calcification. Airways unremarkable. Musculoskeletal: Multiple old bilateral rib fractures. Degenerative change of the spine. CT ABDOMEN PELVIS FINDINGS Hepatobiliary: Gastrostomy tube in adequate position. Pigtail drainage catheter over the right upper quadrant adjacent the border of the right lobe of the liver. There is a small amount of fluid and air adjacent the pigtail catheter tip which may represent the gallbladder. Common bile duct stent in adequate position. Minimal pneumobilia over the left lobe of the liver. Pancreas: Normal. Spleen: Normal. Adrenals/Urinary Tract: Adrenal glands are normal. Kidneys at the lower limits of normal in size. No hydronephrosis, nephrolithiasis or focal mass. Ureters are normal. Foley catheter is present within a decompressed bladder. Stomach/Bowel: The stomach is normal. Small bowel is normal. Appendix is not visualized. Colon is normal. Vascular/Lymphatic: Moderate calcified plaque over the abdominal aorta which is normal in caliber. No adenopathy. Reproductive: No focal abnormality. Other: Minimal free fluid over the left pericolic gutter and pelvis. No free peritoneal air or focal inflammatory change. Mild diffuse subcutaneous edema. Musculoskeletal: Moderate degenerative changes of the spine and hips. IMPRESSION: 1. Large right pleural effusion with associated compressive atelectasis in the right base. Loculated component of pleural fluid over the anteromedial aspect of the right mid to upper thorax. Difficult to assess  for empyema due to lack of IV contrast. 2. Pigtail drainage catheter over the  right upper quadrant adjacent the border of the right lobe of the liver with small amount of fluid and air adjacent the pigtail catheter tip which may represent the gallbladder. Recommend clinical correlation. Common bile duct stent in adequate position. Minimal pneumobilia over the left lobe of the liver. 3. Aortic atherosclerosis. Atherosclerotic coronary artery disease. 4. 5 mm nodule over the left upper lobe. No follow-up needed if patient is low-risk. Non-contrast chest CT can be considered in 12 months if patient is high-risk. This recommendation follows the consensus statement: Guidelines for Management of Incidental Pulmonary Nodules Detected on CT Images: From the Fleischner Society 2017; Radiology 2017; 284:228-243. Aortic Atherosclerosis (ICD10-I70.0). Electronically Signed   By: Elberta Fortis M.D.   On: 09/05/2020 15:51   DG CHEST PORT 1 VIEW  Result Date: 09/07/2020 CLINICAL DATA:  Shortness of breath EXAM: PORTABLE CHEST 1 VIEW COMPARISON:  09/06/2020 FINDINGS: There has been interval placement of a right-sided chest tube. There is significant interval decrease in size of the right-sided pleural effusion. There is improved aeration throughout the right lung field. No pneumothorax. The tracheostomy tube is stable. The cardiac silhouette is unchanged. The left lung field is essentially clear aside from some bibasilar atelectasis and small left-sided pleural effusion. IMPRESSION: Interval placement of a right-sided chest tube with significant interval decrease in size of the right-sided pleural effusion. No pneumothorax. Electronically Signed   By: Katherine Mantle M.D.   On: 09/07/2020 18:53   DG Chest Port 1 View  Result Date: 09/06/2020 CLINICAL DATA:  Status post thoracentesis EXAM: PORTABLE CHEST 1 VIEW COMPARISON:  Chest radiograph from one day prior. FINDINGS: Tracheostomy tube tip overlies the tracheal air  column at the thoracic inlet. Stable cardiomediastinal silhouette with mild cardiomegaly. No pneumothorax. Low lung volumes. Small to moderate right pleural effusion appears slightly increased, potentially loculated. No left pleural effusion. Cephalization of the pulmonary vasculature without overt pulmonary edema. Hazy opacity throughout right hemithorax appears worsened. IMPRESSION: 1. No pneumothorax. Small to moderate right pleural effusion appears slightly increased, potentially loculated. 2. Low lung volumes. Hazy opacity throughout the right hemithorax appears worsened, favor atelectasis. Electronically Signed   By: Delbert Phenix M.D.   On: 09/06/2020 11:20   DG Chest Port 1 View  Result Date: 09/05/2020 CLINICAL DATA:  Evaluate tracheostomy tube position. EXAM: PORTABLE CHEST 1 VIEW COMPARISON:  09/02/2020 FINDINGS: The tracheostomy tube tip is stable above the carina. Unchanged cardiomediastinal contours. Moderate right pleural effusion is identified. Unchanged veil like opacification of the entire right lung. Left lung appears clear. IMPRESSION: 1. Stable tracheostomy tube position. 2. Persistent right pleural effusion and with veil like opacification of the entire right lung. Electronically Signed   By: Signa Kell M.D.   On: 09/05/2020 08:56   CT IMAGE GUIDED DRAINAGE BY PERCUTANEOUS CATHETER  Result Date: 09/07/2020 CLINICAL DATA:  Respiratory failure and recurrent loculated right pleural effusion. EXAM: CT GUIDED PLACEMENT RIGHT PLEURAL DRAINAGE CATHETER ANESTHESIA/SEDATION: 0.5 mg IV Versed Total Moderate Sedation Time:  18 minutes. The patient's level of consciousness and physiologic status were continuously monitored during the procedure by Radiology nursing. PROCEDURE: The procedure, risks, benefits, and alternatives were explained to the patient's daughter. Questions regarding the procedure were encouraged and answered. The patient's daughter understands and consents to the procedure. A  time out was performed prior to initiating the procedure. The right anterolateral chest wall was prepped with chlorhexidine in a sterile fashion, and a sterile drape was applied covering the operative field. A sterile gown and sterile  gloves were used for the procedure. Local anesthesia was provided with 1% Lidocaine. Under CT guidance, an 18 gauge trocar needle was advanced into the right pleural space via a lower anterolateral intercostal space. After return of fluid, a guidewire was advanced into the pleural space. The tract was dilated to 55 [place] and a 12 French pigtail drainage catheter advanced. Catheter position was confirmed by CT. A fluid sample was withdrawn for culture analysis. The tube was then connected to a Pleur-evac device. The catheter was secured at the skin with a Prolene retention suture and StatLock device. COMPLICATIONS: None FINDINGS: Very large right pleural effusion remains which appears partly loculated. After needle placement, there was return of serosanguineous fluid. After pigtail drainage catheter placement in the right pleural space, there is rapid return of fluid. IMPRESSION: CT-guided placement of 12 French pigtail drainage catheter into the right pleural space. There is return of serosanguineous fluid. The drain was connected to a [place] Pleur-evac device which will be connected to wall suction at -20 cm of water. Electronically Signed   By: Irish Lack M.D.   On: 09/07/2020 16:21   US THORACENTESIS ASP PLEURAL SPACE W/IMG GUIDE  Result Date: 09/06/2020 INDICATION: Patient with history of HF, CAD, anoxic brain injury, and recurrent left pleural effusion. Request is made for diagnostic and therapeutic left thoracentesis. EXAM: ULTRASOUND GUIDED DIAGNOSTIC AND THERAPEUTIC LEFT THORACENTESIS MEDICATIONS: 20 mL 1% lidocaine COMPLICATIONS: None immediate. PROCEDURE: An ultrasound guided thoracentesis was thoroughly discussed with the patient and questions answered. The  benefits, risks, alternatives and complications were also discussed. The patient's daughter understands and wishes to proceed with the procedure. Written consent was obtained. Ultrasound was performed to localize and mark an adequate pocket of fluid in the left chest. The area was then prepped and draped in the normal sterile fashion. 1% Lidocaine was used for local anesthesia. Under ultrasound guidance a 6 Fr Safe-T-Centesis catheter was introduced, however revealed a dry tap. 1% lidocaine was used for local anesthesia for rib space below. Again, under ultrasound guidance a 6 French Safe-T-Centesis catheter was introduced, again revealing a dry tap. Procedure was aborted- the catheter was removed and a dressing applied. Postprocedural chest x-ray was ordered following attempted procedure. FINDINGS: Complex and loculated honeycomb appearing left pleural effusion s/p attempted thoracentesis revealing a dry tap. IMPRESSION: Attempted thoracentesis unsuccessful secondary to dry tap. Procedure was aborted. Read by: Elwin Mocha, PA-C Electronically Signed   By: Simonne Come M.D.   On: 09/06/2020 11:34    Labs:  CBC: Recent Labs    09/07/20 0441 09/07/20 2128 09/08/20 0914 09/08/20 1416  WBC 8.1 8.7 6.8 6.4  HGB 6.7* 8.6* 8.2* 7.9*  HCT 22.6* 28.3* 27.9* 26.8*  PLT 171 175 180 172    COAGS: Recent Labs    09/01/20 0955  INR 1.7*    BMP: Recent Labs    09/05/20 1616 09/06/20 1843 09/07/20 0441 09/08/20 0914  NA 148* 144 147* 146*  K 5.2* 5.3* 4.8 4.6  CL 111 108 109 107  CO2 GLUCOSE 155* 271* 197* 200*  BUN 117* 123* 130* 146*  CALCIUM 8.5* 8.2* 8.3* 8.2*  CREATININE 2.33* 2.50* 2.64* 2.65*  GFRNONAA 26* 24* 23* 22*  GFRAA 30* 28* 26* 26*    LIVER FUNCTION TESTS: Recent Labs    09/01/20 0955 09/08/20 0914  BILITOT 1.1  --   AST 45*  --   ALT 44  --   ALKPHOS 106  --  PROT 6.4*  --   ALBUMIN 2.1* 1.9*    Assessment and Plan: Loculated right pleural  effusion; s/p right chest tube by Dr. Fredia Sorrow 09/07/20 Patient stable.  R chest tube remains in place to suction.  Output significant-- now noted at 300 mL since container changed.  No new CXR since 9/20.  Consider repeat based on output trend, but remains significant at this time.  IR following.   Loyce Dys, MS RD PA-C 2:07 PM    I spent a total of 15 Minutes at the the patient's bedside AND on the patient's hospital floor or unit, greater than 50% of which was counseling/coordinating care for right chest tube.

## 2020-09-09 NOTE — Progress Notes (Signed)
Pulmonary Critical Care Medicine [facility]   PULMONARY CRITICAL CARE SERVICE  PROGRESS NOTE  Date of Service: 09/09/2020  Nashawn Hillock  TDD:[id number]  DOB: May 03, 1941   DOA: 09/04/2020  Referring Physician: Carron Curie, MD  HPI: Jerusalem Wert is a 79 y.o. male seen for follow up of Acute on Chronic Respiratory Failure.  Patient at this time is on pressure support wean the goal is for 12 hours today  Medications: Reviewed on Rounds  Physical Exam:  Vitals: Temperature 98.7 pulse 88 respiratory 22 blood pressure is 133/73 saturations 100%  Ventilator Settings on pressure support FiO2 40% pressure 12/5  . General: Comfortable at this time . Eyes: Grossly normal lids, irises & conjunctiva . ENT: grossly tongue is normal . Neck: no obvious mass . Cardiovascular: S1 S2 normal no gallop . Respiratory: No rhonchi no rales are noted at this time . Abdomen: soft . Skin: no rash seen on limited exam . Musculoskeletal: not rigid . Psychiatric:unable to assess . Neurologic: no seizure no involuntary movements         Lab Data:   Basic Metabolic Panel: Recent Labs  Lab 09/05/20 1007 09/05/20 1616 09/06/20 1843 09/07/20 0441 09/08/20 0914  NA 148* 148* 144 147* 146*  K 6.1* 5.2* 5.3* 4.8 4.6  CL 113* 111 108 109 107  CO2 27 26 24 27 28   GLUCOSE 197* 155* 271* 197* 200*  BUN 109* 117* 123* 130* 146*  CREATININE 2.06* 2.33* 2.50* 2.64* 2.65*  CALCIUM 8.6* 8.5* 8.2* 8.3* 8.2*  MG 1.9  --   --   --   --   PHOS  --   --   --   --  4.1    ABG: Recent Labs  Lab 09/05/20 0750  PHART 7.474*  PCO2ART 38.6  PO2ART 64.9*  HCO3 28.0  O2SAT 93.9    Liver Function Tests: Recent Labs  Lab 09/08/20 0914  ALBUMIN 1.9*   No results for input(s): LIPASE, AMYLASE in the last 168 hours. No results for input(s): AMMONIA in the last 168 hours.  CBC: Recent Labs  Lab 09/05/20 0717 09/07/20 0441 09/07/20 2128 09/08/20 0914 09/08/20 1416  WBC 9.6 8.1 8.7  6.8 6.4  HGB 9.0* 6.7* 8.6* 8.2* 7.9*  HCT 29.7* 22.6* 28.3* 27.9* 26.8*  MCV 93.7 93.0 91.3 92.7 91.8  PLT 245 171 175 180 172    Cardiac Enzymes: No results for input(s): CKTOTAL, CKMB, CKMBINDEX, TROPONINI in the last 168 hours.  BNP (last 3 results) Recent Labs    09/04/20 1646 09/05/20 1007  BNP 389.9* 458.9*    ProBNP (last 3 results) No results for input(s): PROBNP in the last 8760 hours.  Radiological Exams: DG Abd 1 View  Result Date: 09/07/2020 CLINICAL DATA:  Femoral line placement EXAM: ABDOMEN - 1 VIEW COMPARISON:  None. FINDINGS: There is a left femoral catheter projecting over the patient's pelvis and abdomen. 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 The tip of the catheter is not readily visualized on this study. A biliary stent is noted projecting over the right upper quadrant. IMPRESSION: 1. Newly placed left-sided central venous catheter likely courses through the patient's duplicated IVC. The tip is not reliably visualized on this study. 2. Nonobstructive bowel gas pattern. Electronically Signed   By: 09/09/2020 M.D.   On: 09/07/2020 18:54   DG CHEST PORT 1 VIEW  Result Date: 09/07/2020 CLINICAL DATA:  Shortness of breath EXAM: PORTABLE CHEST 1 VIEW COMPARISON:  09/06/2020 FINDINGS: There has been interval placement of a right-sided chest tube. There is significant interval decrease in size of the right-sided pleural effusion. There is improved aeration throughout the right lung field. No pneumothorax. The tracheostomy tube is stable. The cardiac silhouette is unchanged. The left lung field is essentially clear aside from some bibasilar atelectasis and small left-sided pleural effusion. IMPRESSION: Interval placement of a right-sided chest tube with significant interval decrease in size of the right-sided pleural effusion. No pneumothorax. Electronically Signed   By: Katherine Mantle M.D.   On: 09/07/2020 18:53    Assessment/Plan Active Problems:   Acute on chronic respiratory failure with hypoxia (HCC)   Chronic combined systolic and diastolic heart failure (HCC)   COPD, severe (HCC)   Chronic atrial fibrillation (HCC)   Metabolic encephalopathy   Pleural effusion, right   1. Acute on chronic respiratory failure with hypoxia continue with pressure support patient is currently on 40% FiO2 currently requiring 12/5 goal is for 12 hours 2. Acute combined systolic diastolic heart failure appears to be compensated we will continue to monitor. 3. Chronic atrial fibrillation rate is controlled 4. Metabolic encephalopathy at baseline we will continue to follow 5. Pleural effusion we will monitoring x-rays   I have personally seen and evaluated the patient, evaluated laboratory and imaging results, formulated the assessment and plan and placed orders. The Patient requires high complexity decision making with multiple systems involvement.  Rounds were done with the Respiratory Therapy Director and Staff therapists and discussed with nursing staff also.  Yevonne Pax, MD [facility] Pulmonary Critical Care Medicine Sleep Medicine

## 2020-09-09 NOTE — Progress Notes (Signed)
[facility]  ROUNDING NOTE   Subjective:  Patient remains critically ill. Renal parameters continue to worsen. However patient has good urine output 1.2 L over the preceding 24 hours.   Objective:  Vital signs in last 24 hours:  Temperature 97.8 pulse 82 respirations 20 blood pressure 128/56  Physical Exam: General:  Critically ill-appearing  Head:  Normocephalic, atraumatic. Dry oral mucosal membranes  Eyes:  Anicteric  Neck:  Tracheostomy in place  Lungs:   Bilateral rales and rhonchi, vent assisted  Heart:  S1S2 no rubs  Abdomen:   Soft, nontender, bowel sounds present, PEG tube in place  Extremities:  No peripheral edema.  Neurologic:  Awake, alert, will follow simple commands  Skin:  No lesions  GU:  Foley catheter in place    Basic Metabolic Panel: Recent Labs  Lab 09/05/20 1007 09/05/20 1007 09/05/20 1616 09/05/20 1616 09/06/20 1843 09/07/20 0441 09/08/20 0914  NA 148*  --  148*  --  144 147* 146*  K 6.1*  --  5.2*  --  5.3* 4.8 4.6  CL 113*  --  111  --  108 109 107  CO2 27  --  26  --  _0 GLUCOSE 197*  --  155*  --  271* 197* 200*  BUN 109*  --  117*  --  123* 130* 146*  CREATININE 2.06*  --  2.33*  --  2.50* 2.64* 2.65*  CALCIUM 8.6*   < > 8.5*   < > 8.2* 8.3* 8.2*  MG 1.9  --   --   --   --   --   --   PHOS  --   --   --   --   --   --  4.1   < > = values in this interval not displayed.    Liver Function Tests: Recent Labs  Lab 09/08/20 0914  ALBUMIN 1.9*   No results for input(s): LIPASE, AMYLASE in the last 168 hours. No results for input(s): AMMONIA in the last 168 hours.  CBC: Recent Labs  Lab 09/05/20 0717 09/07/20 0441 09/07/20 2128 09/08/20 0914 09/08/20 1416  WBC 9.6 8.1 8.7 6.8 6.4  HGB 9.0* 6.7* 8.6* 8.2* 7.9*  HCT 29.7* 22.6* 28.3* 27.9* 26.8*  MCV 93.7 93.0 91.3 92.7 91.8  PLT 245 171 175 180 172    Cardiac Enzymes: No results for input(s): CKTOTAL, CKMB, CKMBINDEX, TROPONINI in the last 168  hours.  BNP: Invalid input(s): POCBNP  CBG: No results for input(s): GLUCAP in the last 168 hours.  Microbiology: Results for orders placed or performed during the hospital encounter of 09/12/2020  SARS CORONAVIRUS 2 (TAT 6-24 HRS) Nasopharyngeal Nasopharyngeal Swab     Status: None   Collection Time: 09/01/20  1:19 PM   Specimen: Nasopharyngeal Swab  Result Value Ref Range Status   SARS Coronavirus 2 NEGATIVE NEGATIVE Final    Comment: (NOTE) SARS-CoV-2 target nucleic acids are NOT DETECTED.  The SARS-CoV-2 RNA is generally detectable in upper and lower respiratory specimens during the acute phase of infection. Negative results do not preclude SARS-CoV-2 infection, do not rule out co-infections with other pathogens, and should not be used as the sole basis for treatment or other patient management decisions. Negative results must be combined with clinical observations, patient history, and epidemiological information. The expected result is Negative.  Fact Sheet for Patients: SugarRoll.be  Fact Sheet for Healthcare Providers: https://www.woods-mathews.com/  This test is not yet approved or  cleared by the [place] and  has been authorized for detection and/or diagnosis of SARS-CoV-2 by FDA under an Emergency Use Authorization (EUA). This EUA will remain  in effect (meaning this test can be used) for the duration of the COVID-19 declaration under Se ction 564(b)(1) of the Act, 21 U.S.C. section 360bbb-3(b)(1), unless the authorization is terminated or revoked sooner.  Performed at [facility], [place] [address], [place], [place] [address]   Culture, Urine     Status: Abnormal   Collection Time: 09/05/20 12:41 PM   Specimen: Urine, Random  Result Value Ref Range Status   Specimen Description URINE, RANDOM  Final   Special Requests NONE  Final   Culture (A)  Final    <10,000 COLONIES/mL INSIGNIFICANT GROWTH Performed  at [facility], [place] [address], [place], [place] [address]    Report Status 09/06/2020 FINAL  Final  Culture, blood (routine x 2)     Status: None (Preliminary result)   Collection Time: 09/05/20  4:16 PM   Specimen: BLOOD  Result Value Ref Range Status   Specimen Description BLOOD RIGHT ANTECUBITAL  Final   Special Requests   Final    BOTTLES DRAWN AEROBIC AND ANAEROBIC Blood Culture adequate volume   Culture   Final    NO GROWTH 3 DAYS Performed at [facility], [place] [address], [place], [place] [address]    Report Status PENDING  Incomplete  Culture, blood (routine x 2)     Status: None (Preliminary result)   Collection Time: 09/05/20  4:16 PM   Specimen: BLOOD LEFT HAND  Result Value Ref Range Status   Specimen Description BLOOD LEFT HAND  Final   Special Requests   Final    BOTTLES DRAWN AEROBIC AND ANAEROBIC Blood Culture adequate volume   Culture   Final    NO GROWTH 3 DAYS Performed at [facility], [place] [address], [place], [place] [address]    Report Status PENDING  Incomplete  Aerobic/Anaerobic Culture (surgical/deep wound)     Status: None (Preliminary result)   Collection Time: 09/07/20  4:09 PM   Specimen: Pleural Fluid  Result Value Ref Range Status   Specimen Description FLUID RIGHT PLEURAL  Final   Special Requests Normal  Final   Gram Stain   Final    MODERATE WBC PRESENT,BOTH PMN AND MONONUCLEAR NO ORGANISMS SEEN    Culture   Final    NO GROWTH 2 DAYS Performed at [facility], [place] [address] [place], [place] [address]    Report Status PENDING  Incomplete    Coagulation Studies: No results for input(s): LABPROT, INR in the last 72 hours.  Urinalysis: No results for input(s): COLORURINE, LABSPEC, PHURINE, GLUCOSEU, HGBUR, BILIRUBINUR, KETONESUR, PROTEINUR, UROBILINOGEN, NITRITE, LEUKOCYTESUR in the last 72 hours.  Invalid input(s): APPERANCEUR    Imaging: DG Abd 1 View  Result Date: 09/07/2020 CLINICAL DATA:  Femoral  line placement EXAM: ABDOMEN - 1 VIEW COMPARISON:  None. FINDINGS: There is a left femoral catheter projecting over the patient's pelvis and abdomen. 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 The tip of the catheter is not readily visualized on this study. A biliary stent is noted projecting over the right upper quadrant. IMPRESSION: 1. Newly placed left-sided central venous catheter likely courses through the patient's duplicated IVC. The tip is not reliably visualized on this study. 2.  Nonobstructive bowel gas pattern. Electronically Signed   By: Constance Holster M.D.   On: 09/07/2020 18:54   DG CHEST PORT 1 VIEW  Result Date: 09/07/2020 CLINICAL DATA:  Shortness of breath EXAM: PORTABLE CHEST 1 VIEW COMPARISON:  09/06/2020 FINDINGS: There has been interval placement of a right-sided chest tube. There is significant interval decrease in size of the right-sided pleural effusion. There is improved aeration throughout the right lung field. No pneumothorax. The tracheostomy tube is stable. The cardiac silhouette is unchanged. The left lung field is essentially clear aside from some bibasilar atelectasis and small left-sided pleural effusion. IMPRESSION: Interval placement of a right-sided chest tube with significant interval decrease in size of the right-sided pleural effusion. No pneumothorax. Electronically Signed   By: Constance Holster M.D.   On: 09/07/2020 18:53   CT IMAGE GUIDED DRAINAGE BY PERCUTANEOUS CATHETER  Result Date: 09/07/2020 CLINICAL DATA:  Respiratory failure and recurrent loculated right pleural effusion. EXAM: CT GUIDED PLACEMENT RIGHT PLEURAL DRAINAGE CATHETER ANESTHESIA/SEDATION: 0.5 mg IV Versed Total Moderate Sedation Time:  18 minutes. The patient's level of consciousness and physiologic status were continuously monitored during the procedure by Radiology nursing. PROCEDURE: The procedure, risks,  benefits, and alternatives were explained to the patient's daughter. Questions regarding the procedure were encouraged and answered. The patient's daughter understands and consents to the procedure. A time out was performed prior to initiating the procedure. The right anterolateral chest wall was prepped with chlorhexidine in a sterile fashion, and a sterile drape was applied covering the operative field. A sterile gown and sterile gloves were used for the procedure. Local anesthesia was provided with 1% Lidocaine. Under CT guidance, an 18 gauge trocar needle was advanced into the right pleural space via a lower anterolateral intercostal space. After return of fluid, a guidewire was advanced into the pleural space. The tract was dilated to 87 [place] and a 12 French pigtail drainage catheter advanced. Catheter position was confirmed by CT. A fluid sample was withdrawn for culture analysis. The tube was then connected to a Pleur-evac device. The catheter was secured at the skin with a Prolene retention suture and StatLock device. COMPLICATIONS: None FINDINGS: Very large right pleural effusion remains which appears partly loculated. After needle placement, there was return of serosanguineous fluid. After pigtail drainage catheter placement in the right pleural space, there is rapid return of fluid. IMPRESSION: CT-guided placement of 12 French pigtail drainage catheter into the right pleural space. There is return of serosanguineous fluid. The drain was connected to a [place] Pleur-evac device which will be connected to wall suction at -20 cm of water. Electronically Signed   By: Aletta Edouard M.D.   On: 09/07/2020 16:21     Medications:       Assessment/ Plan:  79 y.o. male with a PMHx of hyperlipidemia, hypertension, coronary artery disease, chronic diastolic heart failure, COPD, diabetes mellitus type 2, who was admitted to [facility] on 08/26/2020 for ongoing treatment of acute respiratory  failure.  1.  Acute kidney injury/chronic kidney disease stage IIIB baseline EGFR 38.  Suspect acute kidney injury now related to concurrent illness and dehydration.  Patient has ongoing renal dysfunction.  BUN continues to rise and is up to 146 with a creatinine of 2.65.  Serum sodium still a bit high at 146.  Continue hydration with D5W.  No immediate need for dialysis as urine output was 1.2 L over the preceding 24 hours.  2.  Anemia of chronic kidney disease.  Hemoglobin currently  7.9.  Has been dropping over the past several days.  Continue to monitor CBC closely.  3.  Hypernatremia.  Serum sodium 146.  Continue patient on D5W.  Also discussed with dietitian and will change free water flush to 50 cc every 2 hours.   LOS: 0 Castulo Scarpelli 9/22/202111:51 AM

## 2020-09-10 DIAGNOSIS — J9621 Acute and chronic respiratory failure with hypoxia: Secondary | ICD-10-CM | POA: Diagnosis not present

## 2020-09-10 DIAGNOSIS — I5042 Chronic combined systolic (congestive) and diastolic (congestive) heart failure: Secondary | ICD-10-CM | POA: Diagnosis not present

## 2020-09-10 DIAGNOSIS — I482 Chronic atrial fibrillation, unspecified: Secondary | ICD-10-CM | POA: Diagnosis not present

## 2020-09-10 DIAGNOSIS — J449 Chronic obstructive pulmonary disease, unspecified: Secondary | ICD-10-CM | POA: Diagnosis not present

## 2020-09-10 LAB — BASIC METABOLIC PANEL
Anion gap: 9 (ref 5–15)
BUN: 145 mg/dL — ABNORMAL HIGH (ref 8–23)
CO2: 30 mmol/L (ref 22–32)
Calcium: 8.2 mg/dL — ABNORMAL LOW (ref 8.9–10.3)
Chloride: 113 mmol/L — ABNORMAL HIGH (ref 98–111)
Creatinine, Ser: 2.33 mg/dL — ABNORMAL HIGH (ref 0.61–1.24)
GFR calc Af Amer: 30 mL/min — ABNORMAL LOW (ref >60)
GFR calc non Af Amer: 26 mL/min — ABNORMAL LOW (ref >60)
Glucose, Bld: 200 mg/dL — ABNORMAL HIGH (ref 70–99)
Potassium: 5.1 mmol/L (ref 3.5–5.1)
Sodium: 152 mmol/L — ABNORMAL HIGH (ref 135–145)

## 2020-09-10 LAB — CBC
HCT: 28.5 % — ABNORMAL LOW (ref 39.0–52.0)
Hemoglobin: 8.4 g/dL — ABNORMAL LOW (ref 13.0–17.0)
MCH: 27.6 pg (ref 26.0–34.0)
MCHC: 29.5 g/dL — ABNORMAL LOW (ref 30.0–36.0)
MCV: 93.8 fL (ref 80.0–100.0)
Platelets: 183 10*3/uL (ref 150–400)
RBC: 3.04 MIL/uL — ABNORMAL LOW (ref 4.22–5.81)
RDW: 15.9 % — ABNORMAL HIGH (ref 11.5–15.5)
WBC: 6.8 10*3/uL (ref 4.0–10.5)
nRBC: 0 % (ref 0.0–0.2)

## 2020-09-10 LAB — CULTURE, BLOOD (ROUTINE X 2)
Culture: NO GROWTH
Culture: NO GROWTH
Special Requests: ADEQUATE
Special Requests: ADEQUATE

## 2020-09-10 NOTE — Progress Notes (Signed)
Referring Physician(s): Hijazi,A  Supervising Physician: Richarda Overlie  Patient Status:  [facility]  Chief Complaint:  Complex right pleural effusion, respiratory failure  Subjective: Patient resting comfortably. currently asleep; will make eye contact but does not interact or follow commands purposefully. R chest tube in place with ongoing bloody output  Allergies: Patient has no allergy information on record.  Medications: Prior to Admission medications   Not on File     Vital Signs: BP (!) 92/39 (BP Location: Right Arm)   Pulse 82   Resp 12   SpO2 100%   Physical Exam trach/vent; right chest tube intact, connected to wall suction, no obvious air leak, output per nurse/24 hours about 4 to 500 cc blood-tinged fluid  Imaging: DG Abd 1 View  Result Date: 09/07/2020 CLINICAL DATA:  Femoral line placement EXAM: ABDOMEN - 1 VIEW COMPARISON:  None. FINDINGS: There is a left femoral catheter projecting over the patient's pelvis and abdomen. 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 The tip of the catheter is not readily visualized on this study. A biliary stent is noted projecting over the right upper quadrant. IMPRESSION: 1. Newly placed left-sided central venous catheter likely courses through the patient's duplicated IVC. The tip is not reliably visualized on this study. 2. Nonobstructive bowel gas pattern. Electronically Signed   By: Katherine Mantle M.D.   On: 09/07/2020 18:54   DG CHEST PORT 1 VIEW  Result Date: 09/07/2020 CLINICAL DATA:  Shortness of breath EXAM: PORTABLE CHEST 1 VIEW COMPARISON:  09/06/2020 FINDINGS: There has been interval placement of a right-sided chest tube. There is significant interval decrease in size of the right-sided pleural effusion. There is improved aeration throughout the right lung field. No pneumothorax. The tracheostomy tube is stable. The cardiac silhouette is  unchanged. The left lung field is essentially clear aside from some bibasilar atelectasis and small left-sided pleural effusion. IMPRESSION: Interval placement of a right-sided chest tube with significant interval decrease in size of the right-sided pleural effusion. No pneumothorax. Electronically Signed   By: Katherine Mantle M.D.   On: 09/07/2020 18:53   DG Chest Port 1 View  Result Date: 09/06/2020 CLINICAL DATA:  Status post thoracentesis EXAM: PORTABLE CHEST 1 VIEW COMPARISON:  Chest radiograph from one day prior. FINDINGS: Tracheostomy tube tip overlies the tracheal air column at the thoracic inlet. Stable cardiomediastinal silhouette with mild cardiomegaly. No pneumothorax. Low lung volumes. Small to moderate right pleural effusion appears slightly increased, potentially loculated. No left pleural effusion. Cephalization of the pulmonary vasculature without overt pulmonary edema. Hazy opacity throughout right hemithorax appears worsened. IMPRESSION: 1. No pneumothorax. Small to moderate right pleural effusion appears slightly increased, potentially loculated. 2. Low lung volumes. Hazy opacity throughout the right hemithorax appears worsened, favor atelectasis. Electronically Signed   By: Delbert Phenix M.D.   On: 09/06/2020 11:20   CT IMAGE GUIDED DRAINAGE BY PERCUTANEOUS CATHETER  Result Date: 09/07/2020 CLINICAL DATA:  Respiratory failure and recurrent loculated right pleural effusion. EXAM: CT GUIDED PLACEMENT RIGHT PLEURAL DRAINAGE CATHETER ANESTHESIA/SEDATION: 0.5 mg IV Versed Total Moderate Sedation Time:  18 minutes. The patient's level of consciousness and physiologic status were continuously monitored during the procedure by Radiology nursing. PROCEDURE: The procedure, risks, benefits, and alternatives were explained to the patient's daughter. Questions regarding the procedure were encouraged and answered. The patient's daughter understands and consents to the procedure. A time out was  performed prior to initiating  the procedure. The right anterolateral chest wall was prepped with chlorhexidine in a sterile fashion, and a sterile drape was applied covering the operative field. A sterile gown and sterile gloves were used for the procedure. Local anesthesia was provided with 1% Lidocaine. Under CT guidance, an 18 gauge trocar needle was advanced into the right pleural space via a lower anterolateral intercostal space. After return of fluid, a guidewire was advanced into the pleural space. The tract was dilated to 24 [place] and a 12 French pigtail drainage catheter advanced. Catheter position was confirmed by CT. A fluid sample was withdrawn for culture analysis. The tube was then connected to a Pleur-evac device. The catheter was secured at the skin with a Prolene retention suture and StatLock device. COMPLICATIONS: None FINDINGS: Very large right pleural effusion remains which appears partly loculated. After needle placement, there was return of serosanguineous fluid. After pigtail drainage catheter placement in the right pleural space, there is rapid return of fluid. IMPRESSION: CT-guided placement of 12 French pigtail drainage catheter into the right pleural space. There is return of serosanguineous fluid. The drain was connected to a [place] Pleur-evac device which will be connected to wall suction at -20 cm of water. Electronically Signed   By: Irish Lack M.D.   On: 09/07/2020 16:21   US THORACENTESIS ASP PLEURAL SPACE W/IMG GUIDE  Result Date: 09/06/2020 INDICATION: Patient with history of HF, CAD, anoxic brain injury, and recurrent left pleural effusion. Request is made for diagnostic and therapeutic left thoracentesis. EXAM: ULTRASOUND GUIDED DIAGNOSTIC AND THERAPEUTIC LEFT THORACENTESIS MEDICATIONS: 20 mL 1% lidocaine COMPLICATIONS: None immediate. PROCEDURE: An ultrasound guided thoracentesis was thoroughly discussed with the patient and questions answered. The benefits, risks,  alternatives and complications were also discussed. The patient's daughter understands and wishes to proceed with the procedure. Written consent was obtained. Ultrasound was performed to localize and mark an adequate pocket of fluid in the left chest. The area was then prepped and draped in the normal sterile fashion. 1% Lidocaine was used for local anesthesia. Under ultrasound guidance a 6 Fr Safe-T-Centesis catheter was introduced, however revealed a dry tap. 1% lidocaine was used for local anesthesia for rib space below. Again, under ultrasound guidance a 6 French Safe-T-Centesis catheter was introduced, again revealing a dry tap. Procedure was aborted- the catheter was removed and a dressing applied. Postprocedural chest x-ray was ordered following attempted procedure. FINDINGS: Complex and loculated honeycomb appearing left pleural effusion s/p attempted thoracentesis revealing a dry tap. IMPRESSION: Attempted thoracentesis unsuccessful secondary to dry tap. Procedure was aborted. Read by: Elwin Mocha, PA-C Electronically Signed   By: Simonne Come M.D.   On: 09/06/2020 11:34    Labs:  CBC: Recent Labs    09/07/20 0441 09/07/20 2128 09/08/20 0914 09/08/20 1416  WBC 8.1 8.7 6.8 6.4  HGB 6.7* 8.6* 8.2* 7.9*  HCT 22.6* 28.3* 27.9* 26.8*  PLT 171 175 180 172    COAGS: Recent Labs    09/01/20 0955  INR 1.7*    BMP: Recent Labs    09/06/20 1843 09/07/20 0441 09/08/20 0914 09/09/20 1947  NA 144 147* 146* 151*  K 5.3* 4.8 4.6 4.8  CL 108 109 107 109  CO2 24 27 28 29   GLUCOSE 271* 197* 200* 150*  BUN 123* 130* 146* 147*  CALCIUM 8.2* 8.3* 8.2* 8.4*  CREATININE 2.50* 2.64* 2.65* 2.43*  GFRNONAA 24* 23* 22* 25*  GFRAA 28* 26* 26* 29*    LIVER FUNCTION TESTS: Recent Labs  09/01/20 0955 09/08/20 0914 09/09/20 1947  BILITOT 1.1  --   --   AST 45*  --   --   ALT 44  --   --   ALKPHOS 106  --   --   PROT 6.4*  --   --   ALBUMIN 2.1* 1.9* 1.8*    Assessment and  Plan: Patient with history of complex loculated right pleural effusion, status post chest tube placement on 9/20; no new labs, fluid culture negative to date; will check chest x-ray in a.m. to assess adequacy of drainage   Electronically Signed: D. Jeananne Rama, PA-C 09/10/2020, 11:00 AM   I spent a total of 15 minutes at the the patient's bedside AND on the patient's hospital floor or unit, greater than 50% of which was counseling/coordinating care for right chest tube placement    Patient ID: Eric Knapp, male   DOB: [birth year], 79 y.o.   MRN: [id number]

## 2020-09-10 NOTE — Progress Notes (Signed)
Pulmonary Critical Care Medicine [facility]   PULMONARY CRITICAL CARE SERVICE  PROGRESS NOTE  Date of Service: 09/10/2020  Eric Knapp  VBT:[id number]  DOB: [birth year]   DOA: 08/28/2020  Referring Physician: Carron Curie, MD  HPI: Eric Knapp is a 79 y.o. male seen for follow up of Acute on Chronic Respiratory Failure.  Patient did about 14 hours of pressure support yesterday plan today is to continue to advance  Medications: Reviewed on Rounds  Physical Exam:  Vitals: Temperature is 97.8 pulse 81 respiratory rate is 22 blood pressure is 112/55 saturations 100%  Ventilator Settings on pressure support FiO2 40% pressure 12/5  . General: Comfortable at this time . Eyes: Grossly normal lids, irises & conjunctiva . ENT: grossly tongue is normal . Neck: no obvious mass . Cardiovascular: S1 S2 normal no gallop . Respiratory: Scattered rhonchi expansion is equal . Abdomen: soft . Skin: no rash seen on limited exam . Musculoskeletal: not rigid . Psychiatric:unable to assess . Neurologic: no seizure no involuntary movements         Lab Data:   Basic Metabolic Panel: Recent Labs  Lab 09/05/20 1007 09/05/20 1007 09/05/20 1616 09/06/20 1843 09/07/20 0441 09/08/20 0914 09/09/20 1947  NA 148*   < > 148* 144 147* 146* 151*  K 6.1*   < > 5.2* 5.3* 4.8 4.6 4.8  CL 113*   < > 111 108 109 107 109  CO2 27   < > 26 24 27 28 29   GLUCOSE 197*   < > 155* 271* 197* 200* 150*  BUN 109*   < > 117* 123* 130* 146* 147*  CREATININE 2.06*   < > 2.33* 2.50* 2.64* 2.65* 2.43*  CALCIUM 8.6*   < > 8.5* 8.2* 8.3* 8.2* 8.4*  MG 1.9  --   --   --   --   --   --   PHOS  --   --   --   --   --  4.1 3.0   < > = values in this interval not displayed.    ABG: Recent Labs  Lab 09/05/20 0750  PHART 7.474*  PCO2ART 38.6  PO2ART 64.9*  HCO3 28.0  O2SAT 93.9    Liver Function Tests: Recent Labs  Lab 09/08/20 0914 09/09/20 1947  ALBUMIN 1.9* 1.8*   No results for  input(s): LIPASE, AMYLASE in the last 168 hours. No results for input(s): AMMONIA in the last 168 hours.  CBC: Recent Labs  Lab 09/05/20 0717 09/07/20 0441 09/07/20 2128 09/08/20 0914 09/08/20 1416  WBC 9.6 8.1 8.7 6.8 6.4  HGB 9.0* 6.7* 8.6* 8.2* 7.9*  HCT 29.7* 22.6* 28.3* 27.9* 26.8*  MCV 93.7 93.0 91.3 92.7 91.8  PLT 245 171 175 180 172    Cardiac Enzymes: No results for input(s): CKTOTAL, CKMB, CKMBINDEX, TROPONINI in the last 168 hours.  BNP (last 3 results) Recent Labs    09/04/20 1646 09/05/20 1007  BNP 389.9* 458.9*    ProBNP (last 3 results) No results for input(s): PROBNP in the last 8760 hours.  Radiological Exams: No results found.  Assessment/Plan Active Problems:   Acute on chronic respiratory failure with hypoxia (HCC)   Chronic combined systolic and diastolic heart failure (HCC)   COPD, severe (HCC)   Chronic atrial fibrillation (HCC)   Metabolic encephalopathy   Pleural effusion, right   1. Acute on chronic respiratory failure hypoxia plan is to continue to wean on pressure support 12/5.  Titrate oxygen  continue pulmonary toilet. 2. Chronic combined systolic diastolic heart failure supportive care 3. Severe COPD at baseline 4. Chronic atrial fibrillation rate controlled 5. Metabolic encephalopathy no change continue to follow 6. Pleural effusion follow radiologically   I have personally seen and evaluated the patient, evaluated laboratory and imaging results, formulated the assessment and plan and placed orders. The Patient requires high complexity decision making with multiple systems involvement.  Rounds were done with the Respiratory Therapy Director and Staff therapists and discussed with nursing staff also.  Yevonne Pax, MD [facility] Pulmonary Critical Care Medicine Sleep Medicine

## 2020-09-11 ENCOUNTER — Other Ambulatory Visit (HOSPITAL_COMMUNITY): Payer: Medicare Other

## 2020-09-11 DIAGNOSIS — I5042 Chronic combined systolic (congestive) and diastolic (congestive) heart failure: Secondary | ICD-10-CM | POA: Diagnosis not present

## 2020-09-11 DIAGNOSIS — J449 Chronic obstructive pulmonary disease, unspecified: Secondary | ICD-10-CM | POA: Diagnosis not present

## 2020-09-11 DIAGNOSIS — J9621 Acute and chronic respiratory failure with hypoxia: Secondary | ICD-10-CM | POA: Diagnosis not present

## 2020-09-11 DIAGNOSIS — I482 Chronic atrial fibrillation, unspecified: Secondary | ICD-10-CM | POA: Diagnosis not present

## 2020-09-11 LAB — VANCOMYCIN, RANDOM: Vancomycin Rm: 12

## 2020-09-11 NOTE — Progress Notes (Signed)
[facility]  ROUNDING NOTE   Subjective:  Critical illness persist. Still hyperkalemic. BUN up to 145 with a creatinine of 2.3. However good urine output noted at 1.8 L.   Objective:  Vital signs in last 24 hours:  Temperature 97.6 pulse 80 respiration 16 blood pressure 105/64  Physical Exam: General:  Critically ill-appearing  Head:  Normocephalic, atraumatic. Dry oral mucosal membranes  Eyes:  Anicteric  Neck:  Tracheostomy in place  Lungs:   Bilateral rales and rhonchi, vent assisted  Heart:  S1S2 no rubs  Abdomen:   Soft, nontender, bowel sounds present, PEG tube in place  Extremities:  No peripheral edema.  Neurologic:  Lethargic but arousable  Skin:  No lesions  GU:  Foley catheter in place    Basic Metabolic Panel: Recent Labs  Lab 09/05/20 1007 09/05/20 1616 09/06/20 1843 09/06/20 1843 09/07/20 0441 09/07/20 0441 09/08/20 0914 09/09/20 1947 09/10/20 1001  NA 148*   < > 144  --  147*  --  146* 151* 152*  K 6.1*   < > 5.3*  --  4.8  --  4.6 4.8 5.1  CL 113*   < > 108  --  109  --  107 109 113*  CO2 27   < > 24  --  27  --  _0 GLUCOSE 197*   < > 271*  --  197*  --  200* 150* 200*  BUN 109*   < > 123*  --  130*  --  146* 147* 145*  CREATININE 2.06*   < > 2.50*  --  2.64*  --  2.65* 2.43* 2.33*  CALCIUM 8.6*   < > 8.2*   < > 8.3*   < > 8.2* 8.4* 8.2*  MG 1.9  --   --   --   --   --   --   --   --   PHOS  --   --   --   --   --   --  4.1 3.0  --    < > = values in this interval not displayed.    Liver Function Tests: Recent Labs  Lab 09/08/20 0914 09/09/20 1947  ALBUMIN 1.9* 1.8*   No results for input(s): LIPASE, AMYLASE in the last 168 hours. No results for input(s): AMMONIA in the last 168 hours.  CBC: Recent Labs  Lab 09/07/20 0441 09/07/20 2128 09/08/20 0914 09/08/20 1416 09/10/20 1001  WBC 8.1 8.7 6.8 6.4 6.8  HGB 6.7* 8.6* 8.2* 7.9* 8.4*  HCT 22.6* 28.3* 27.9* 26.8* 28.5*  MCV 93.0 91.3 92.7 91.8 93.8  PLT 171  175 180 172 183    Cardiac Enzymes: No results for input(s): CKTOTAL, CKMB, CKMBINDEX, TROPONINI in the last 168 hours.  BNP: Invalid input(s): POCBNP  CBG: No results for input(s): GLUCAP in the last 168 hours.  Microbiology: Results for orders placed or performed during the hospital encounter of 08/27/2020  SARS CORONAVIRUS 2 (TAT 6-24 HRS) Nasopharyngeal Nasopharyngeal Swab     Status: None   Collection Time: 09/01/20  1:19 PM   Specimen: Nasopharyngeal Swab  Result Value Ref Range Status   SARS Coronavirus 2 NEGATIVE NEGATIVE Final    Comment: (NOTE) SARS-CoV-2 target nucleic acids are NOT DETECTED.  The SARS-CoV-2 RNA is generally detectable in upper and lower respiratory specimens during the acute phase of infection. Negative results do not preclude SARS-CoV-2 infection, do not rule out co-infections with other pathogens, and  should not be used as the sole basis for treatment or other patient management decisions. Negative results must be combined with clinical observations, patient history, and epidemiological information. The expected result is Negative.  Fact Sheet for Patients: SugarRoll.be  Fact Sheet for Healthcare Providers: https://www.woods-mathews.com/  This test is not yet approved or cleared by the [place] FDA and  has been authorized for detection and/or diagnosis of SARS-CoV-2 by FDA under an Emergency Use Authorization (EUA). This EUA will remain  in effect (meaning this test can be used) for the duration of the COVID-19 declaration under Se ction 564(b)(1) of the Act, 21 U.S.C. section 360bbb-3(b)(1), unless the authorization is terminated or revoked sooner.  Performed at [facility], [place] [address] [place], [place] [address]   Culture, Urine     Status: Abnormal   Collection Time: 09/05/20 12:41 PM   Specimen: Urine, Random  Result Value Ref Range Status   Specimen Description URINE, RANDOM   Final   Special Requests NONE  Final   Culture (A)  Final    <10,000 COLONIES/mL INSIGNIFICANT GROWTH Performed at [facility], [place] [address] [place], [place] [address]    Report Status 09/06/2020 FINAL  Final  Culture, blood (routine x 2)     Status: None   Collection Time: 09/05/20  4:16 PM   Specimen: BLOOD  Result Value Ref Range Status   Specimen Description BLOOD RIGHT ANTECUBITAL  Final   Special Requests   Final    BOTTLES DRAWN AEROBIC AND ANAEROBIC Blood Culture adequate volume   Culture   Final    NO GROWTH 5 DAYS Performed at [facility], [place] [address] [place], [place] [address]    Report Status 09/10/2020 FINAL  Final  Culture, blood (routine x 2)     Status: None   Collection Time: 09/05/20  4:16 PM   Specimen: BLOOD LEFT HAND  Result Value Ref Range Status   Specimen Description BLOOD LEFT HAND  Final   Special Requests   Final    BOTTLES DRAWN AEROBIC AND ANAEROBIC Blood Culture adequate volume   Culture   Final    NO GROWTH 5 DAYS Performed at [facility], [place] [address], [place], [place] [address]    Report Status 09/10/2020 FINAL  Final  Aerobic/Anaerobic Culture (surgical/deep wound)     Status: None (Preliminary result)   Collection Time: 09/07/20  4:09 PM   Specimen: Pleural Fluid  Result Value Ref Range Status   Specimen Description FLUID RIGHT PLEURAL  Final   Special Requests Normal  Final   Gram Stain   Final    MODERATE WBC PRESENT,BOTH PMN AND MONONUCLEAR NO ORGANISMS SEEN    Culture   Final    NO GROWTH 3 DAYS Performed at [facility], [place] [address] [place],  [address]    Report Status PENDING  Incomplete    Coagulation Studies: No results for input(s): LABPROT, INR in the last 72 hours.  Urinalysis: No results for input(s): COLORURINE, LABSPEC, PHURINE, GLUCOSEU, HGBUR, BILIRUBINUR, KETONESUR, PROTEINUR, UROBILINOGEN, NITRITE, LEUKOCYTESUR in the last 72 hours.  Invalid input(s): APPERANCEUR     Imaging: DG Chest Port 1 View  Result Date: 09/11/2020 CLINICAL DATA:  Pleural effusion EXAM: PORTABLE CHEST 1 VIEW COMPARISON:  09/07/2020 FINDINGS: Tracheostomy and right basilar pigtail chest tube are again identified. Diffuse right pulmonary opacification is again seen and appears progressive since prior examination likely representing progressive right lung atelectasis  given associated volume loss. Mild left basilar atelectasis or infiltrate is present. There is no pneumothorax. No pleural effusion on the left. IMPRESSION: Progressive volume loss with increasing right lung opacification likely related to right lung atelectasis. Right basilar chest tube unchanged. Mild left basilar atelectasis or infiltrate. Electronically Signed   By: Fidela Salisbury MD   On: 09/11/2020 06:18     Medications:       Assessment/ Plan:  79 y.o. male with a PMHx of hyperlipidemia, hypertension, coronary artery disease, chronic diastolic heart failure, COPD, diabetes mellitus type 2, who was admitted to [facility] on 08/20/2020 for ongoing treatment of acute respiratory failure.  1.  Acute kidney injury/chronic kidney disease stage IIIB baseline EGFR 38.  Suspect acute kidney injury now related to concurrent illness and dehydration.  BUN currently 145 with a creatinine of 2.3.  D5W increased to 100 cc/h this a.m.  Continue D5W for the next 48 hours.  Reassess on Monday.  Urine output 1.8 L over the preceding 24 hours.  Therefore no immediate need for dialysis.  2.  Anemia of chronic kidney disease.  Hemoglobin currently 8.4.  Continue to monitor.  3.  Hypernatremia.  Serum sodium has risen to 152.  Increase D5W to 100 cc/h.  Patient also receiving 50 cc every 3 hours via tube feeds.  Continue to monitor serum sodium closely.   LOS: 0 Eric Knapp 9/24/20218:07 AM

## 2020-09-11 NOTE — Progress Notes (Addendum)
Pulmonary Critical Care Medicine [facility] [place]   PULMONARY CRITICAL CARE SERVICE  PROGRESS NOTE  Date of Service: 09/11/2020  Eric Knapp  NWG:[id number]  DOB: Nov 13, 1941   DOA: 08/21/2020  Referring Physician: Carron Curie, MD  HPI: Eric Knapp is a 79 y.o. male seen for follow up of Acute on Chronic Respiratory Failure.  Patient is currently on aerosol trach collar 35% for 2 to 4-hour goal currently satting well no fever distress.  Medications: Reviewed on Rounds  Physical Exam:  Vitals: Pulse 80 respirations 16 BP 105/64 O2 sat 100% temp 97.6  Ventilator Settings ATC 35%  . General: Comfortable at this time . Eyes: Grossly normal lids, irises & conjunctiva . ENT: grossly tongue is normal . Neck: no obvious mass . Cardiovascular: S1 S2 normal no gallop . Respiratory: Coarse breath sounds . Abdomen: soft . Skin: no rash seen on limited exam . Musculoskeletal: not rigid . Psychiatric:unable to assess . Neurologic: no seizure no involuntary movements         Lab Data:   Basic Metabolic Panel: Recent Labs  Lab 09/05/20 1007 09/05/20 1616 09/06/20 1843 09/07/20 0441 09/08/20 0914 09/09/20 1947 09/10/20 1001  NA 148*   < > 144 147* 146* 151* 152*  K 6.1*   < > 5.3* 4.8 4.6 4.8 5.1  CL 113*   < > 108 109 107 109 113*  CO2 27   < > 24 27 28 29 30   GLUCOSE 197*   < > 271* 197* 200* 150* 200*  BUN 109*   < > 123* 130* 146* 147* 145*  CREATININE 2.06*   < > 2.50* 2.64* 2.65* 2.43* 2.33*  CALCIUM 8.6*   < > 8.2* 8.3* 8.2* 8.4* 8.2*  MG 1.9  --   --   --   --   --   --   PHOS  --   --   --   --  4.1 3.0  --    < > = values in this interval not displayed.    ABG: Recent Labs  Lab 09/05/20 0750  PHART 7.474*  PCO2ART 38.6  PO2ART 64.9*  HCO3 28.0  O2SAT 93.9    Liver Function Tests: Recent Labs  Lab 09/08/20 0914 09/09/20 1947  ALBUMIN 1.9* 1.8*   No results for input(s): LIPASE, AMYLASE in the last 168 hours. No results for input(s):  AMMONIA in the last 168 hours.  CBC: Recent Labs  Lab 09/07/20 0441 09/07/20 2128 09/08/20 0914 09/08/20 1416 09/10/20 1001  WBC 8.1 8.7 6.8 6.4 6.8  HGB 6.7* 8.6* 8.2* 7.9* 8.4*  HCT 22.6* 28.3* 27.9* 26.8* 28.5*  MCV 93.0 91.3 92.7 91.8 93.8  PLT 171 175 180 172 183    Cardiac Enzymes: No results for input(s): CKTOTAL, CKMB, CKMBINDEX, TROPONINI in the last 168 hours.  BNP (last 3 results) Recent Labs    09/04/20 1646 09/05/20 1007  BNP 389.9* 458.9*    ProBNP (last 3 results) No results for input(s): PROBNP in the last 8760 hours.  Radiological Exams: DG Chest Port 1 View  Result Date: 09/11/2020 CLINICAL DATA:  Pleural effusion EXAM: PORTABLE CHEST 1 VIEW COMPARISON:  09/07/2020 FINDINGS: Tracheostomy and right basilar pigtail chest tube are again identified. Diffuse right pulmonary opacification is again seen and appears progressive since prior examination likely representing progressive right lung atelectasis given associated volume loss. Mild left basilar atelectasis or infiltrate is present. There is no pneumothorax. No pleural effusion on the left. IMPRESSION: Progressive volume  loss with increasing right lung opacification likely related to right lung atelectasis. Right basilar chest tube unchanged. Mild left basilar atelectasis or infiltrate. Electronically Signed   By: Helyn Numbers MD   On: 09/11/2020 06:18    Assessment/Plan Active Problems:   Acute on chronic respiratory failure with hypoxia (HCC)   Chronic combined systolic and diastolic heart failure (HCC)   COPD, severe (HCC)   Chronic atrial fibrillation (HCC)   Metabolic encephalopathy   Pleural effusion, right   1. Acute on chronic respiratory failure hypoxia patient has a 2 to 4-hour goal today on aerosol trach collar then will wean on pressure support for some time.  We will continue supportive measures and aggressive pulmonary toilet. 2. Chronic combined systolic diastolic heart failure  supportive care 3. Severe COPD at baseline 4. Chronic atrial fibrillation rate controlled 5. Metabolic encephalopathy no change continue to follow 6. Pleural effusion follow radiologically   I have personally seen and evaluated the patient, evaluated laboratory and imaging results, formulated the assessment and plan and placed orders. The Patient requires high complexity decision making with multiple systems involvement.  Rounds were done with the Respiratory Therapy Director and Staff therapists and discussed with nursing staff also.  Yevonne Pax, MD [facility] Pulmonary Critical Care Medicine Sleep Medicine

## 2020-09-11 NOTE — Progress Notes (Signed)
Referring Physician(s): Shayne Alken [facility])  Supervising Physician: Malachy Moan  Patient Status:  [facility]-in[facility]  Chief Complaint: None- tracheostomy  Subjective:  Right loculated pleural effusion/empyema s/p right chest tube placement in IR 09/07/2020. Patient laying in bed, eyes open, tracheostomy in place. Does not follow simple commands. Right chest tube site c/d/i.  CXR this AM: 1. Progressive volume loss with increasing right lung opacification likely related to right lung atelectasis. Right basilar chest tube unchanged. 2. Mild left basilar atelectasis or infiltrate.   Allergies: Patient has no allergy information on record.  Medications: Prior to Admission medications   Not on File     Vital Signs: BP (!) 92/39 (BP Location: Right Arm)   Pulse 82   Resp 12   SpO2 100%   Physical Exam Vitals and nursing note reviewed.  Constitutional:      General: He is not in acute distress.    Comments: Tracheostomy.  Pulmonary:     Effort: Pulmonary effort is normal. No respiratory distress.     Comments: Tracheostomy. Right chest tube site without erythema, drainage, or active bleeding; approximately 1,110 bloody fluid in pleure-vac; tube to suction. Skin:    General: Skin is warm and dry.     Imaging: DG Abd 1 View  Result Date: 09/07/2020 CLINICAL DATA:  Femoral line placement EXAM: ABDOMEN - 1 VIEW COMPARISON:  None. FINDINGS: There is a left femoral catheter projecting over the patient's pelvis and abdomen. 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 The tip of the catheter is not readily visualized on this study. A biliary stent is noted projecting over the right upper quadrant. IMPRESSION: 1. Newly placed left-sided central venous catheter likely courses through the patient's duplicated IVC. The tip is not reliably visualized on this study. 2. Nonobstructive bowel gas  pattern. Electronically Signed   By: Katherine Mantle M.D.   On: 09/07/2020 18:54   DG Chest Port 1 View  Result Date: 09/11/2020 CLINICAL DATA:  Pleural effusion EXAM: PORTABLE CHEST 1 VIEW COMPARISON:  09/07/2020 FINDINGS: Tracheostomy and right basilar pigtail chest tube are again identified. Diffuse right pulmonary opacification is again seen and appears progressive since prior examination likely representing progressive right lung atelectasis given associated volume loss. Mild left basilar atelectasis or infiltrate is present. There is no pneumothorax. No pleural effusion on the left. IMPRESSION: Progressive volume loss with increasing right lung opacification likely related to right lung atelectasis. Right basilar chest tube unchanged. Mild left basilar atelectasis or infiltrate. Electronically Signed   By: Helyn Numbers MD   On: 09/11/2020 06:18   DG CHEST PORT 1 VIEW  Result Date: 09/07/2020 CLINICAL DATA:  Shortness of breath EXAM: PORTABLE CHEST 1 VIEW COMPARISON:  09/06/2020 FINDINGS: There has been interval placement of a right-sided chest tube. There is significant interval decrease in size of the right-sided pleural effusion. There is improved aeration throughout the right lung field. No pneumothorax. The tracheostomy tube is stable. The cardiac silhouette is unchanged. The left lung field is essentially clear aside from some bibasilar atelectasis and small left-sided pleural effusion. IMPRESSION: Interval placement of a right-sided chest tube with significant interval decrease in size of the right-sided pleural effusion. No pneumothorax. Electronically Signed   By: Katherine Mantle M.D.   On: 09/07/2020 18:53   CT IMAGE GUIDED DRAINAGE BY PERCUTANEOUS CATHETER  Result Date: 09/07/2020 CLINICAL DATA:  Respiratory failure and recurrent loculated right pleural effusion.  EXAM: CT GUIDED PLACEMENT RIGHT PLEURAL DRAINAGE CATHETER ANESTHESIA/SEDATION: 0.5 mg IV Versed Total Moderate Sedation  Time:  18 minutes. The patient's level of consciousness and physiologic status were continuously monitored during the procedure by Radiology nursing. PROCEDURE: The procedure, risks, benefits, and alternatives were explained to the patient's daughter. Questions regarding the procedure were encouraged and answered. The patient's daughter understands and consents to the procedure. A time out was performed prior to initiating the procedure. The right anterolateral chest wall was prepped with chlorhexidine in a sterile fashion, and a sterile drape was applied covering the operative field. A sterile gown and sterile gloves were used for the procedure. Local anesthesia was provided with 1% Lidocaine. Under CT guidance, an 18 gauge trocar needle was advanced into the right pleural space via a lower anterolateral intercostal space. After return of fluid, a guidewire was advanced into the pleural space. The tract was dilated to 46 [place] and a 12 French pigtail drainage catheter advanced. Catheter position was confirmed by CT. A fluid sample was withdrawn for culture analysis. The tube was then connected to a Pleur-evac device. The catheter was secured at the skin with a Prolene retention suture and StatLock device. COMPLICATIONS: None FINDINGS: Very large right pleural effusion remains which appears partly loculated. After needle placement, there was return of serosanguineous fluid. After pigtail drainage catheter placement in the right pleural space, there is rapid return of fluid. IMPRESSION: CT-guided placement of 12 French pigtail drainage catheter into the right pleural space. There is return of serosanguineous fluid. The drain was connected to a [place] Pleur-evac device which will be connected to wall suction at -20 cm of water. Electronically Signed   By: Irish Lack M.D.   On: 09/07/2020 16:21    Labs:  CBC: Recent Labs    09/07/20 2128 09/08/20 0914 09/08/20 1416 09/10/20 1001  WBC 8.7 6.8 6.4 6.8    HGB 8.6* 8.2* 7.9* 8.4*  HCT 28.3* 27.9* 26.8* 28.5*  PLT 175 180 172 183    COAGS: Recent Labs    09/01/20 0955  INR 1.7*    BMP: Recent Labs    09/07/20 0441 09/08/20 0914 09/09/20 1947 09/10/20 1001  NA 147* 146* 151* 152*  K 4.8 4.6 4.8 5.1  CL 109 107 109 113*  CO2 27 28 29 30   GLUCOSE 197* 200* 150* 200*  BUN 130* 146* 147* 145*  CALCIUM 8.3* 8.2* 8.4* 8.2*  CREATININE 2.64* 2.65* 2.43* 2.33*  GFRNONAA 23* 22* 25* 26*  GFRAA 26* 26* 29* 30*    LIVER FUNCTION TESTS: Recent Labs    09/01/20 0955 09/08/20 0914 09/09/20 1947  BILITOT 1.1  --   --   AST 45*  --   --   ALT 44  --   --   ALKPHOS 106  --   --   PROT 6.4*  --   --   ALBUMIN 2.1* 1.9* 1.8*    Assessment and Plan:  Right loculated pleural effusion/empyema s/p right chest tube placement in IR 09/07/2020. Right chest tube stable to suction with approximately 1,110 bloody fluid in pleure-vac. Continue current tube management- tube to remain to suction at this time. Plan for repeat CT chest when output diminishes. Further plans per [facility]- appreciate and agree with management. IR to follow.   Electronically Signed: [facility], PA-C 09/11/2020, 1:52 PM   I spent a total of 25 Minutes at the the patient's bedside AND on the patient's hospital floor or unit, greater than 50%  of which was counseling/coordinating care for right empyema s/p right chest tube placement.

## 2020-09-12 DIAGNOSIS — J9621 Acute and chronic respiratory failure with hypoxia: Secondary | ICD-10-CM | POA: Diagnosis not present

## 2020-09-12 DIAGNOSIS — J449 Chronic obstructive pulmonary disease, unspecified: Secondary | ICD-10-CM | POA: Diagnosis not present

## 2020-09-12 DIAGNOSIS — I5042 Chronic combined systolic (congestive) and diastolic (congestive) heart failure: Secondary | ICD-10-CM | POA: Diagnosis not present

## 2020-09-12 DIAGNOSIS — I482 Chronic atrial fibrillation, unspecified: Secondary | ICD-10-CM | POA: Diagnosis not present

## 2020-09-12 NOTE — Progress Notes (Signed)
Pulmonary Critical Care Medicine [facility] [place]   PULMONARY CRITICAL CARE SERVICE  PROGRESS NOTE  Date of Service: 09/12/2020  Yaw Escoto  YDX:[id number]  DOB: 08-25-1941   DOA: 2020/09/22  Referring Physician: Carron Curie, MD  HPI: Alwyn Cordner is a 79 y.o. male seen for follow up of Acute on Chronic Respiratory Failure.  Patient currently is on T collar of the goal today is for about 8 hours  Medications: Reviewed on Rounds  Physical Exam:  Vitals: Temperature is 98.3 pulse 90 respiratory 21 blood pressure is 158/69 saturations 100%  Ventilator Settings off the ventilator on T collar  . General: Comfortable at this time . Eyes: Grossly normal lids, irises & conjunctiva . ENT: grossly tongue is normal . Neck: no obvious mass . Cardiovascular: S1 S2 normal no gallop . Respiratory: Scattered rhonchi coarse breath sounds . Abdomen: soft . Skin: no rash seen on limited exam . Musculoskeletal: not rigid . Psychiatric:unable to assess . Neurologic: no seizure no involuntary movements         Lab Data:   Basic Metabolic Panel: Recent Labs  Lab 09/06/20 1843 09/07/20 0441 09/08/20 0914 09/09/20 1947 09/10/20 1001  NA 144 147* 146* 151* 152*  K 5.3* 4.8 4.6 4.8 5.1  CL 108 109 107 109 113*  CO2 24 27 28 29 30   GLUCOSE 271* 197* 200* 150* 200*  BUN 123* 130* 146* 147* 145*  CREATININE 2.50* 2.64* 2.65* 2.43* 2.33*  CALCIUM 8.2* 8.3* 8.2* 8.4* 8.2*  PHOS  --   --  4.1 3.0  --     ABG: No results for input(s): PHART, PCO2ART, PO2ART, HCO3, O2SAT in the last 168 hours.  Liver Function Tests: Recent Labs  Lab 09/08/20 0914 09/09/20 1947  ALBUMIN 1.9* 1.8*   No results for input(s): LIPASE, AMYLASE in the last 168 hours. No results for input(s): AMMONIA in the last 168 hours.  CBC: Recent Labs  Lab 09/07/20 0441 09/07/20 2128 09/08/20 0914 09/08/20 1416 09/10/20 1001  WBC 8.1 8.7 6.8 6.4 6.8  HGB 6.7* 8.6* 8.2* 7.9* 8.4*  HCT 22.6* 28.3*  27.9* 26.8* 28.5*  MCV 93.0 91.3 92.7 91.8 93.8  PLT 171 175 180 172 183    Cardiac Enzymes: No results for input(s): CKTOTAL, CKMB, CKMBINDEX, TROPONINI in the last 168 hours.  BNP (last 3 results) Recent Labs    09/04/20 1646 09/05/20 1007  BNP 389.9* 458.9*    ProBNP (last 3 results) No results for input(s): PROBNP in the last 8760 hours.  Radiological Exams: DG Chest Port 1 View  Result Date: 09/11/2020 CLINICAL DATA:  Pleural effusion EXAM: PORTABLE CHEST 1 VIEW COMPARISON:  09/07/2020 FINDINGS: Tracheostomy and right basilar pigtail chest tube are again identified. Diffuse right pulmonary opacification is again seen and appears progressive since prior examination likely representing progressive right lung atelectasis given associated volume loss. Mild left basilar atelectasis or infiltrate is present. There is no pneumothorax. No pleural effusion on the left. IMPRESSION: Progressive volume loss with increasing right lung opacification likely related to right lung atelectasis. Right basilar chest tube unchanged. Mild left basilar atelectasis or infiltrate. Electronically Signed   By: 09/09/2020 MD   On: 09/11/2020 06:18    Assessment/Plan Active Problems:   Acute on chronic respiratory failure with hypoxia (HCC)   Chronic combined systolic and diastolic heart failure (HCC)   COPD, severe (HCC)   Chronic atrial fibrillation (HCC)   Metabolic encephalopathy   Pleural effusion, right   1. Acute  on chronic respiratory failure with hypoxia we will continue with weaning on T collar today's goal is about 8 hours 2. Chronic combined systolic diastolic heart failure compensated we will monitor 3. Severe COPD continue with medical management 4. Chronic atrial fibrillation rate is controlled we will continue to follow 5. Metabolic encephalopathy no change 6. Pleural effusion we will continue with supportive care follow-up x-rays   I have personally seen and evaluated the  patient, evaluated laboratory and imaging results, formulated the assessment and plan and placed orders. The Patient requires high complexity decision making with multiple systems involvement.  Rounds were done with the Respiratory Therapy Director and Staff therapists and discussed with nursing staff also.  Yevonne Pax, MD [facility] Pulmonary Critical Care Medicine Sleep Medicine

## 2020-09-13 ENCOUNTER — Other Ambulatory Visit (HOSPITAL_COMMUNITY): Payer: Medicare Other

## 2020-09-13 DIAGNOSIS — J9621 Acute and chronic respiratory failure with hypoxia: Secondary | ICD-10-CM | POA: Diagnosis not present

## 2020-09-13 DIAGNOSIS — I5042 Chronic combined systolic (congestive) and diastolic (congestive) heart failure: Secondary | ICD-10-CM | POA: Diagnosis not present

## 2020-09-13 DIAGNOSIS — J449 Chronic obstructive pulmonary disease, unspecified: Secondary | ICD-10-CM | POA: Diagnosis not present

## 2020-09-13 DIAGNOSIS — I482 Chronic atrial fibrillation, unspecified: Secondary | ICD-10-CM | POA: Diagnosis not present

## 2020-09-13 NOTE — Progress Notes (Signed)
Pulmonary Critical Care Medicine [facility] [place]   PULMONARY CRITICAL CARE SERVICE  PROGRESS NOTE  Date of Service: 09/13/2020  Bryer Cozzolino  BHA:[id number]  DOB: [birth year]   DOA: 08/29/2020  Referring Physician: Carron Curie, MD  HPI: Ruffus Kamaka is a 79 y.o. male seen for follow up of Acute on Chronic Respiratory Failure.  Patient currently is on T collar has been on 35% FiO2 with good saturations  Medications: Reviewed on Rounds  Physical Exam:  Vitals: Temperature is 97.3 pulse 89 respiratory 28 blood pressure is 139/73  Ventilator Settings on T collar of the ventilator currently 35% FiO2  . General: Comfortable at this time . Eyes: Grossly normal lids, irises & conjunctiva . ENT: grossly tongue is normal . Neck: no obvious mass . Cardiovascular: S1 S2 normal no gallop . Respiratory: No rhonchi no rales are heard . Abdomen: soft . Skin: no rash seen on limited exam . Musculoskeletal: not rigid . Psychiatric:unable to assess . Neurologic: no seizure no involuntary movements         Lab Data:   Basic Metabolic Panel: Recent Labs  Lab 09/06/20 1843 09/07/20 0441 09/08/20 0914 09/09/20 1947 09/10/20 1001  NA 144 147* 146* 151* 152*  K 5.3* 4.8 4.6 4.8 5.1  CL 108 109 107 109 113*  CO2 24 27 28 29 30   GLUCOSE 271* 197* 200* 150* 200*  BUN 123* 130* 146* 147* 145*  CREATININE 2.50* 2.64* 2.65* 2.43* 2.33*  CALCIUM 8.2* 8.3* 8.2* 8.4* 8.2*  PHOS  --   --  4.1 3.0  --     ABG: No results for input(s): PHART, PCO2ART, PO2ART, HCO3, O2SAT in the last 168 hours.  Liver Function Tests: Recent Labs  Lab 09/08/20 0914 09/09/20 1947  ALBUMIN 1.9* 1.8*   No results for input(s): LIPASE, AMYLASE in the last 168 hours. No results for input(s): AMMONIA in the last 168 hours.  CBC: Recent Labs  Lab 09/07/20 0441 09/07/20 2128 09/08/20 0914 09/08/20 1416 09/10/20 1001  WBC 8.1 8.7 6.8 6.4 6.8  HGB 6.7* 8.6* 8.2* 7.9* 8.4*  HCT 22.6* 28.3*  27.9* 26.8* 28.5*  MCV 93.0 91.3 92.7 91.8 93.8  PLT 171 175 180 172 183    Cardiac Enzymes: No results for input(s): CKTOTAL, CKMB, CKMBINDEX, TROPONINI in the last 168 hours.  BNP (last 3 results) Recent Labs    09/04/20 1646 09/05/20 1007  BNP 389.9* 458.9*    ProBNP (last 3 results) No results for input(s): PROBNP in the last 8760 hours.  Radiological Exams: DG Abd Portable 1V  Result Date: 09/13/2020 CLINICAL DATA:  Ileus EXAM: PORTABLE ABDOMEN - 1 VIEW COMPARISON:  09/07/2020 FINDINGS: Examination is technically limited due to exposure parameters. There are 2 pigtail drainage catheters projected in or over the right upper quadrant. A biliary stent is present. A gastrostomy tube is present. Scattered gas and stool in the colon. No small or large bowel distention. Cardiac enlargement. Lung bases are clear. IMPRESSION: Technically limited study. Nonobstructive bowel gas pattern. Electronically Signed   By: 09/09/2020 M.D.   On: 09/13/2020 02:49    Assessment/Plan Active Problems:   Acute on chronic respiratory failure with hypoxia (HCC)   Chronic combined systolic and diastolic heart failure (HCC)   COPD, severe (HCC)   Chronic atrial fibrillation (HCC)   Metabolic encephalopathy   Pleural effusion, right   1. Acute on chronic respiratory failure hypoxia we will continue with T collar trials on 35% FiO2.  Continue with  secretion management supportive care. 2. Chronic combined systolic diastolic heart failure appears to be compensated 3. Severe COPD at baseline 4. Chronic atrial fibrillation rate is controlled 5. Metabolic encephalopathy at baseline 6. Pleural effusion we will monitor   I have personally seen and evaluated the patient, evaluated laboratory and imaging results, formulated the assessment and plan and placed orders. The Patient requires high complexity decision making with multiple systems involvement.  Rounds were done with the Respiratory Therapy  Director and Staff therapists and discussed with nursing staff also.  Yevonne Pax, MD [facility] Pulmonary Critical Care Medicine Sleep Medicine

## 2020-09-14 DIAGNOSIS — I5042 Chronic combined systolic (congestive) and diastolic (congestive) heart failure: Secondary | ICD-10-CM | POA: Diagnosis not present

## 2020-09-14 DIAGNOSIS — I482 Chronic atrial fibrillation, unspecified: Secondary | ICD-10-CM | POA: Diagnosis not present

## 2020-09-14 DIAGNOSIS — J449 Chronic obstructive pulmonary disease, unspecified: Secondary | ICD-10-CM | POA: Diagnosis not present

## 2020-09-14 DIAGNOSIS — J9621 Acute and chronic respiratory failure with hypoxia: Secondary | ICD-10-CM | POA: Diagnosis not present

## 2020-09-14 LAB — AEROBIC/ANAEROBIC CULTURE W GRAM STAIN (SURGICAL/DEEP WOUND)
Culture: NO GROWTH
Special Requests: NORMAL

## 2020-09-14 LAB — CBC
HCT: 27.3 % — ABNORMAL LOW (ref 39.0–52.0)
Hemoglobin: 8.1 g/dL — ABNORMAL LOW (ref 13.0–17.0)
MCH: 27.7 pg (ref 26.0–34.0)
MCHC: 29.7 g/dL — ABNORMAL LOW (ref 30.0–36.0)
MCV: 93.5 fL (ref 80.0–100.0)
Platelets: 187 10*3/uL (ref 150–400)
RBC: 2.92 MIL/uL — ABNORMAL LOW (ref 4.22–5.81)
RDW: 15.9 % — ABNORMAL HIGH (ref 11.5–15.5)
WBC: 8 10*3/uL (ref 4.0–10.5)
nRBC: 0 % (ref 0.0–0.2)

## 2020-09-14 LAB — RENAL FUNCTION PANEL
Albumin: 1.7 g/dL — ABNORMAL LOW (ref 3.5–5.0)
Anion gap: 8 (ref 5–15)
BUN: 107 mg/dL — ABNORMAL HIGH (ref 8–23)
CO2: 24 mmol/L (ref 22–32)
Calcium: 8.1 mg/dL — ABNORMAL LOW (ref 8.9–10.3)
Chloride: 109 mmol/L (ref 98–111)
Creatinine, Ser: 1.73 mg/dL — ABNORMAL HIGH (ref 0.61–1.24)
GFR calc Af Amer: 43 mL/min — ABNORMAL LOW (ref >60)
GFR calc non Af Amer: 38 mL/min — ABNORMAL LOW (ref >60)
Glucose, Bld: 182 mg/dL — ABNORMAL HIGH (ref 70–99)
Phosphorus: 3.2 mg/dL (ref 2.5–4.6)
Potassium: 5.6 mmol/L — ABNORMAL HIGH (ref 3.5–5.1)
Sodium: 141 mmol/L (ref 135–145)

## 2020-09-14 NOTE — Progress Notes (Signed)
[facility]  ROUNDING NOTE   Subjective:  Patient has been weaned from the ventilator. Awaiting new serum sodium as well as BUN and creatinine. Urine output over the patient 24 hours was 1.8 L.  Objective:  Vital signs in last 24 hours:  Temperature 97.5 pulse 86 respiration 17 blood pressure 132/58  Physical Exam: General:  Critically ill-appearing  Head:  Normocephalic, atraumatic. Dry oral mucosal membranes  Eyes:  Anicteric  Neck:  Tracheostomy in place  Lungs:   Bilateral rales and rhonchi, vent assisted  Heart:  S1S2 no rubs  Abdomen:   Soft, nontender, bowel sounds present, PEG tube in place  Extremities:  No peripheral edema.  Neurologic:  Awake, alert, will follow simple commands  Skin:  No lesions  GU:  Foley catheter in place    Basic Metabolic Panel: Recent Labs  Lab 09/08/20 0914 09/09/20 1947 09/10/20 1001  NA 146* 151* 152*  K 4.6 4.8 5.1  CL 107 109 113*  CO2 $Re'28 29 30  'jNT$ GLUCOSE 200* 150* 200*  BUN 146* 147* 145*  CREATININE 2.65* 2.43* 2.33*  CALCIUM 8.2* 8.4* 8.2*  PHOS 4.1 3.0  --     Liver Function Tests: Recent Labs  Lab 09/08/20 0914 09/09/20 1947  ALBUMIN 1.9* 1.8*   No results for input(s): LIPASE, AMYLASE in the last 168 hours. No results for input(s): AMMONIA in the last 168 hours.  CBC: Recent Labs  Lab 09/07/20 2128 09/08/20 0914 09/08/20 1416 09/10/20 1001  WBC 8.7 6.8 6.4 6.8  HGB 8.6* 8.2* 7.9* 8.4*  HCT 28.3* 27.9* 26.8* 28.5*  MCV 91.3 92.7 91.8 93.8  PLT 175 180 172 183    Cardiac Enzymes: No results for input(s): CKTOTAL, CKMB, CKMBINDEX, TROPONINI in the last 168 hours.  BNP: Invalid input(s): POCBNP  CBG: No results for input(s): GLUCAP in the last 168 hours.  Microbiology: Results for orders placed or performed during the hospital encounter of 09/07/2020  SARS CORONAVIRUS 2 (TAT 6-24 HRS) Nasopharyngeal Nasopharyngeal Swab     Status: None   Collection Time: 09/01/20  1:19 PM   Specimen:  Nasopharyngeal Swab  Result Value Ref Range Status   SARS Coronavirus 2 NEGATIVE NEGATIVE Final    Comment: (NOTE) SARS-CoV-2 target nucleic acids are NOT DETECTED.  The SARS-CoV-2 RNA is generally detectable in upper and lower respiratory specimens during the acute phase of infection. Negative results do not preclude SARS-CoV-2 infection, do not rule out co-infections with other pathogens, and should not be used as the sole basis for treatment or other patient management decisions. Negative results must be combined with clinical observations, patient history, and epidemiological information. The expected result is Negative.  Fact Sheet for Patients: SugarRoll.be  Fact Sheet for Healthcare Providers: https://www.woods-mathews.com/  This test is not yet approved or cleared by the [place] FDA and  has been authorized for detection and/or diagnosis of SARS-CoV-2 by FDA under an Emergency Use Authorization (EUA). This EUA will remain  in effect (meaning this test can be used) for the duration of the COVID-19 declaration under Se ction 564(b)(1) of the Act, 21 U.S.C. section 360bbb-3(b)(1), unless the authorization is terminated or revoked sooner.  Performed at [facility], [place] [address], [place], [place] [address]   Culture, Urine     Status: Abnormal   Collection Time: 09/05/20 12:41 PM   Specimen: Urine, Random  Result Value Ref Range Status   Specimen Description URINE, RANDOM  Final   Special Requests NONE  Final   Culture (A)  Final    <10,000 COLONIES/mL INSIGNIFICANT GROWTH Performed at [facility], [place] [address], [place], [place] [address]    Report Status 09/06/2020 FINAL  Final  Culture, blood (routine x 2)     Status: None   Collection Time: 09/05/20  4:16 PM   Specimen: BLOOD  Result Value Ref Range Status   Specimen Description BLOOD RIGHT ANTECUBITAL  Final   Special Requests   Final    BOTTLES  DRAWN AEROBIC AND ANAEROBIC Blood Culture adequate volume   Culture   Final    NO GROWTH 5 DAYS Performed at [facility], [place] [address], [place], [place] [address]    Report Status 09/10/2020 FINAL  Final  Culture, blood (routine x 2)     Status: None   Collection Time: 09/05/20  4:16 PM   Specimen: BLOOD LEFT HAND  Result Value Ref Range Status   Specimen Description BLOOD LEFT HAND  Final   Special Requests   Final    BOTTLES DRAWN AEROBIC AND ANAEROBIC Blood Culture adequate volume   Culture   Final    NO GROWTH 5 DAYS Performed at [facility], [place] [address] [place], [place] [address]    Report Status 09/10/2020 FINAL  Final  Aerobic/Anaerobic Culture (surgical/deep wound)     Status: None (Preliminary result)   Collection Time: 09/07/20  4:09 PM   Specimen: Pleural Fluid  Result Value Ref Range Status   Specimen Description FLUID RIGHT PLEURAL  Final   Special Requests Normal  Final   Gram Stain   Final    MODERATE WBC PRESENT,BOTH PMN AND MONONUCLEAR NO ORGANISMS SEEN    Culture   Final    No growth aerobically or anaerobically. Performed at [facility], [place] [address], [place], [place] [address]    Report Status PENDING  Incomplete    Coagulation Studies: No results for input(s): LABPROT, INR in the last 72 hours.  Urinalysis: No results for input(s): COLORURINE, LABSPEC, PHURINE, GLUCOSEU, HGBUR, BILIRUBINUR, KETONESUR, PROTEINUR, UROBILINOGEN, NITRITE, LEUKOCYTESUR in the last 72 hours.  Invalid input(s): APPERANCEUR    Imaging: DG Abd Portable 1V  Result Date: 09/13/2020 CLINICAL DATA:  Ileus EXAM: PORTABLE ABDOMEN - 1 VIEW COMPARISON:  09/07/2020 FINDINGS: Examination is technically limited due to exposure parameters. There are 2 pigtail drainage catheters projected in or over the right upper quadrant. A biliary stent is present. A gastrostomy tube is present. Scattered gas and stool in the colon. No small or large bowel distention.  Cardiac enlargement. Lung bases are clear. IMPRESSION: Technically limited study. Nonobstructive bowel gas pattern. Electronically Signed   By: Lucienne Capers M.D.   On: 09/13/2020 02:49     Medications:       Assessment/ Plan:  79 y.o. male with a PMHx of hyperlipidemia, hypertension, coronary artery disease, chronic diastolic heart failure, COPD, diabetes mellitus type 2, who was admitted to [facility] on 09/14/2020 for ongoing treatment of acute respiratory failure.  1.  Acute kidney injury/chronic kidney disease stage IIIB baseline EGFR 38.  Suspect acute kidney injury now related to concurrent illness and dehydration.   -Awaiting new renal function testing today.  Good urine output of 1.8 L.  Therefore no acute indication for dialysis.  2.  Anemia of chronic kidney disease.  Continue to periodically monitor hemoglobin as most recent hemoglobin was 8.4.  3.  Hypernatremia.  Free water flushes were increased to 50 cc  every 2 hours.  Also received D5W over the weekend.  Repeat serum sodium today.   LOS: 0 Eric Knapp 9/27/20217:56 AM

## 2020-09-14 NOTE — Progress Notes (Signed)
    Right chest tube intact Placed in IR 09/06/20  OP still significant 550 cc serous fluid in vac No air leak  Last CXR 9/24 Chest tube in good position  Will follow

## 2020-09-14 NOTE — Progress Notes (Signed)
Pulmonary Critical Care Medicine [facility] [place]   PULMONARY CRITICAL CARE SERVICE  PROGRESS NOTE  Date of Service: 09/14/2020  Eric Knapp  JOI:[id number]  DOB: [birth year]   DOA: 09/07/2020  Referring Physician: Carron Curie, MD  HPI: Eric Knapp is a 79 y.o. male seen for follow up of Acute on Chronic Respiratory Failure.  Patient currently is on T collar has been on 35% FiO2 with a goal today of 24 hours  Medications: Reviewed on Rounds  Physical Exam:  Vitals: Temperature is 97.5 pulse 86 respiratory 17 blood pressure is 132/58 saturations 98%  Ventilator Settings off the ventilator on T collar  . General: Comfortable at this time . Eyes: Grossly normal lids, irises & conjunctiva . ENT: grossly tongue is normal . Neck: no obvious mass . Cardiovascular: S1 S2 normal no gallop . Respiratory: Scattered coarse rhonchi . Abdomen: soft . Skin: no rash seen on limited exam . Musculoskeletal: not rigid . Psychiatric:unable to assess . Neurologic: no seizure no involuntary movements         Lab Data:   Basic Metabolic Panel: Recent Labs  Lab 09/08/20 0914 09/09/20 1947 09/10/20 1001  NA 146* 151* 152*  K 4.6 4.8 5.1  CL 107 109 113*  CO2 28 29 30   GLUCOSE 200* 150* 200*  BUN 146* 147* 145*  CREATININE 2.65* 2.43* 2.33*  CALCIUM 8.2* 8.4* 8.2*  PHOS 4.1 3.0  --     ABG: No results for input(s): PHART, PCO2ART, PO2ART, HCO3, O2SAT in the last 168 hours.  Liver Function Tests: Recent Labs  Lab 09/08/20 0914 09/09/20 1947  ALBUMIN 1.9* 1.8*   No results for input(s): LIPASE, AMYLASE in the last 168 hours. No results for input(s): AMMONIA in the last 168 hours.  CBC: Recent Labs  Lab 09/07/20 2128 09/08/20 0914 09/08/20 1416 09/10/20 1001  WBC 8.7 6.8 6.4 6.8  HGB 8.6* 8.2* 7.9* 8.4*  HCT 28.3* 27.9* 26.8* 28.5*  MCV 91.3 92.7 91.8 93.8  PLT 175 180 172 183    Cardiac Enzymes: No results for input(s): CKTOTAL, CKMB, CKMBINDEX,  TROPONINI in the last 168 hours.  BNP (last 3 results) Recent Labs    09/04/20 1646 09/05/20 1007  BNP 389.9* 458.9*    ProBNP (last 3 results) No results for input(s): PROBNP in the last 8760 hours.  Radiological Exams: DG Abd Portable 1V  Result Date: 09/13/2020 CLINICAL DATA:  Ileus EXAM: PORTABLE ABDOMEN - 1 VIEW COMPARISON:  09/07/2020 FINDINGS: Examination is technically limited due to exposure parameters. There are 2 pigtail drainage catheters projected in or over the right upper quadrant. A biliary stent is present. A gastrostomy tube is present. Scattered gas and stool in the colon. No small or large bowel distention. Cardiac enlargement. Lung bases are clear. IMPRESSION: Technically limited study. Nonobstructive bowel gas pattern. Electronically Signed   By: 09/09/2020 M.D.   On: 09/13/2020 02:49    Assessment/Plan Active Problems:   Acute on chronic respiratory failure with hypoxia (HCC)   Chronic combined systolic and diastolic heart failure (HCC)   COPD, severe (HCC)   Chronic atrial fibrillation (HCC)   Metabolic encephalopathy   Pleural effusion, right   1. Acute on chronic respiratory failure hypoxia we will continue with weaning on T collar with a 24-hour goal 2. Chronic combined systolic diastolic heart failure monitoring fluid status we will continue to monitor nephrology is also following the patient 3. Severe COPD medical management we will continue to follow 4. Chronic atrial  fibrillation rate is controlled 5. Metabolic encephalopathy no change 6. Pleural effusion follow radiologically   I have personally seen and evaluated the patient, evaluated laboratory and imaging results, formulated the assessment and plan and placed orders. The Patient requires high complexity decision making with multiple systems involvement.  Rounds were done with the Respiratory Therapy Director and Staff therapists and discussed with nursing staff also.  Yevonne Pax,  MD [facility] Pulmonary Critical Care Medicine Sleep Medicine

## 2020-09-15 DIAGNOSIS — J449 Chronic obstructive pulmonary disease, unspecified: Secondary | ICD-10-CM | POA: Diagnosis not present

## 2020-09-15 DIAGNOSIS — I482 Chronic atrial fibrillation, unspecified: Secondary | ICD-10-CM | POA: Diagnosis not present

## 2020-09-15 DIAGNOSIS — I5042 Chronic combined systolic (congestive) and diastolic (congestive) heart failure: Secondary | ICD-10-CM | POA: Diagnosis not present

## 2020-09-15 DIAGNOSIS — J9621 Acute and chronic respiratory failure with hypoxia: Secondary | ICD-10-CM | POA: Diagnosis not present

## 2020-09-15 LAB — POTASSIUM: Potassium: 5.1 mmol/L (ref 3.5–5.1)

## 2020-09-15 NOTE — Progress Notes (Addendum)
Pulmonary Critical Care Medicine [facility] [place]   PULMONARY CRITICAL CARE SERVICE  PROGRESS NOTE  Date of Service: 09/15/2020  Eric Knapp  RDE:[id number]  DOB: 08-01-1941   DOA: September 17, 2020  Referring Physician: Carron Curie, MD  HPI: Eric Knapp is a 79 y.o. male seen for follow up of Acute on Chronic Respiratory Failure.  Patient remains on aerosol trach collar today for a 12-hour goal satting well this time no distress.  Medications: Reviewed on Rounds  Physical Exam:  Vitals: Pulse 77 respirations 32 BP 117/63 O2 sat 100% temp 96.3  Ventilator Settings ATC 35%  . General: Comfortable at this time . Eyes: Grossly normal lids, irises & conjunctiva . ENT: grossly tongue is normal . Neck: no obvious mass . Cardiovascular: S1 S2 normal no gallop . Respiratory: No rales or rhonchi noted . Abdomen: soft . Skin: no rash seen on limited exam . Musculoskeletal: not rigid . Psychiatric:unable to assess . Neurologic: no seizure no involuntary movements         Lab Data:   Basic Metabolic Panel: Recent Labs  Lab 09/09/20 1947 09/10/20 1001 09/14/20 0936 09/15/20 0801  NA 151* 152* 141  --   K 4.8 5.1 5.6* 5.1  CL 109 113* 109  --   CO2 29 30 24   --   GLUCOSE 150* 200* 182*  --   BUN 147* 145* 107*  --   CREATININE 2.43* 2.33* 1.73*  --   CALCIUM 8.4* 8.2* 8.1*  --   PHOS 3.0  --  3.2  --     ABG: No results for input(s): PHART, PCO2ART, PO2ART, HCO3, O2SAT in the last 168 hours.  Liver Function Tests: Recent Labs  Lab 09/09/20 1947 09/14/20 0936  ALBUMIN 1.8* 1.7*   No results for input(s): LIPASE, AMYLASE in the last 168 hours. No results for input(s): AMMONIA in the last 168 hours.  CBC: Recent Labs  Lab 09/08/20 1416 09/10/20 1001 09/14/20 0936  WBC 6.4 6.8 8.0  HGB 7.9* 8.4* 8.1*  HCT 26.8* 28.5* 27.3*  MCV 91.8 93.8 93.5  PLT 172 183 187    Cardiac Enzymes: No results for input(s): CKTOTAL, CKMB, CKMBINDEX, TROPONINI in the  last 168 hours.  BNP (last 3 results) Recent Labs    09/04/20 1646 09/05/20 1007  BNP 389.9* 458.9*    ProBNP (last 3 results) No results for input(s): PROBNP in the last 8760 hours.  Radiological Exams: No results found.  Assessment/Plan Active Problems:   Acute on chronic respiratory failure with hypoxia (HCC)   Chronic combined systolic and diastolic heart failure (HCC)   COPD, severe (HCC)   Chronic atrial fibrillation (HCC)   Metabolic encephalopathy   Pleural effusion, right   1. Acute on chronic respiratory failure hypoxia patient has a 12 goal today on aerosol trach collar 35% satting well this time continue supportive measures pulmonary toilet. 2. Chronic combined systolic diastolic heart failure monitoring fluid status we will continue to monitor nephrology is also following the patient 3. Severe COPD medical management we will continue to follow 4. Chronic atrial fibrillation rate is controlled 5. Metabolic encephalopathy no change 6. Pleural effusion follow radiologically   I have personally seen and evaluated the patient, evaluated laboratory and imaging results, formulated the assessment and plan and placed orders. The Patient requires high complexity decision making with multiple systems involvement.  Rounds were done with the Respiratory Therapy Director and Staff therapists and discussed with nursing staff also.  09/07/20, MD  [facility] Pulmonary Critical Care Medicine Sleep Medicine

## 2020-09-16 DIAGNOSIS — I5042 Chronic combined systolic (congestive) and diastolic (congestive) heart failure: Secondary | ICD-10-CM | POA: Diagnosis not present

## 2020-09-16 DIAGNOSIS — J449 Chronic obstructive pulmonary disease, unspecified: Secondary | ICD-10-CM | POA: Diagnosis not present

## 2020-09-16 DIAGNOSIS — J9621 Acute and chronic respiratory failure with hypoxia: Secondary | ICD-10-CM | POA: Diagnosis not present

## 2020-09-16 DIAGNOSIS — I482 Chronic atrial fibrillation, unspecified: Secondary | ICD-10-CM | POA: Diagnosis not present

## 2020-09-16 LAB — RENAL FUNCTION PANEL
Albumin: 1.7 g/dL — ABNORMAL LOW (ref 3.5–5.0)
Anion gap: 8 (ref 5–15)
BUN: 101 mg/dL — ABNORMAL HIGH (ref 8–23)
CO2: 25 mmol/L (ref 22–32)
Calcium: 7.9 mg/dL — ABNORMAL LOW (ref 8.9–10.3)
Chloride: 105 mmol/L (ref 98–111)
Creatinine, Ser: 1.77 mg/dL — ABNORMAL HIGH (ref 0.61–1.24)
GFR calc Af Amer: 42 mL/min — ABNORMAL LOW (ref >60)
GFR calc non Af Amer: 36 mL/min — ABNORMAL LOW (ref >60)
Glucose, Bld: 159 mg/dL — ABNORMAL HIGH (ref 70–99)
Phosphorus: 3.7 mg/dL (ref 2.5–4.6)
Potassium: 5.9 mmol/L — ABNORMAL HIGH (ref 3.5–5.1)
Sodium: 138 mmol/L (ref 135–145)

## 2020-09-16 NOTE — Progress Notes (Signed)
[facility]  ROUNDING NOTE   Subjective:  Patient seen and evaluated at bedside. Plan is for patient to be off the ventilator for 16 hours today.  Objective:  Vital signs in last 24 hours:  Temperature 97.5 pulse 72 respirations 29 blood pressure 119/54  Physical Exam: General:  Critically ill-appearing  Head:  Normocephalic, atraumatic. Dry oral mucosal membranes  Eyes:  Anicteric  Neck:  Tracheostomy in place  Lungs:   Bilateral rales and rhonchi, vent assisted  Heart:  S1S2 no rubs  Abdomen:   Soft, nontender, bowel sounds present, PEG tube in place  Extremities:  No peripheral edema.  Neurologic:  Awake, alert, will follow simple commands  Skin:  No lesions  GU:  Foley catheter in place    Basic Metabolic Panel: Recent Labs  Lab 09/09/20 1947 09/10/20 1001 09/14/20 0936 09/15/20 0801  NA 151* 152* 141  --   K 4.8 5.1 5.6* 5.1  CL 109 113* 109  --   CO2 $Re'29 30 24  'vNt$ --   GLUCOSE 150* 200* 182*  --   BUN 147* 145* 107*  --   CREATININE 2.43* 2.33* 1.73*  --   CALCIUM 8.4* 8.2* 8.1*  --   PHOS 3.0  --  3.2  --     Liver Function Tests: Recent Labs  Lab 09/09/20 1947 09/14/20 0936  ALBUMIN 1.8* 1.7*   No results for input(s): LIPASE, AMYLASE in the last 168 hours. No results for input(s): AMMONIA in the last 168 hours.  CBC: Recent Labs  Lab 09/10/20 1001 09/14/20 0936  WBC 6.8 8.0  HGB 8.4* 8.1*  HCT 28.5* 27.3*  MCV 93.8 93.5  PLT 183 187    Cardiac Enzymes: No results for input(s): CKTOTAL, CKMB, CKMBINDEX, TROPONINI in the last 168 hours.  BNP: Invalid input(s): POCBNP  CBG: No results for input(s): GLUCAP in the last 168 hours.  Microbiology: Results for orders placed or performed during the hospital encounter of 08/29/2020  SARS CORONAVIRUS 2 (TAT 6-24 HRS) Nasopharyngeal Nasopharyngeal Swab     Status: None   Collection Time: 09/01/20  1:19 PM   Specimen: Nasopharyngeal Swab  Result Value Ref Range Status   SARS  Coronavirus 2 NEGATIVE NEGATIVE Final    Comment: (NOTE) SARS-CoV-2 target nucleic acids are NOT DETECTED.  The SARS-CoV-2 RNA is generally detectable in upper and lower respiratory specimens during the acute phase of infection. Negative results do not preclude SARS-CoV-2 infection, do not rule out co-infections with other pathogens, and should not be used as the sole basis for treatment or other patient management decisions. Negative results must be combined with clinical observations, patient history, and epidemiological information. The expected result is Negative.  Fact Sheet for Patients: SugarRoll.be  Fact Sheet for Healthcare Providers: https://www.woods-mathews.com/  This test is not yet approved or cleared by the [place] FDA and  has been authorized for detection and/or diagnosis of SARS-CoV-2 by FDA under an Emergency Use Authorization (EUA). This EUA will remain  in effect (meaning this test can be used) for the duration of the COVID-19 declaration under Se ction 564(b)(1) of the Act, 21 U.S.C. section 360bbb-3(b)(1), unless the authorization is terminated or revoked sooner.  Performed at [facility], [place] [address], [place], [place] [address]   Culture, Urine     Status: Abnormal   Collection Time: 09/05/20 12:41 PM   Specimen: Urine, Random  Result Value Ref Range Status   Specimen Description URINE, RANDOM  Final  Special Requests NONE  Final   Culture (A)  Final    <10,000 COLONIES/mL INSIGNIFICANT GROWTH Performed at [facility], [place] [address] [place], [place] [address]    Report Status 09/06/2020 FINAL  Final  Culture, blood (routine x 2)     Status: None   Collection Time: 09/05/20  4:16 PM   Specimen: BLOOD  Result Value Ref Range Status   Specimen Description BLOOD RIGHT ANTECUBITAL  Final   Special Requests   Final    BOTTLES DRAWN AEROBIC AND ANAEROBIC Blood Culture adequate volume    Culture   Final    NO GROWTH 5 DAYS Performed at [facility], [place] [address] [place], [place] [address]    Report Status 09/10/2020 FINAL  Final  Culture, blood (routine x 2)     Status: None   Collection Time: 09/05/20  4:16 PM   Specimen: BLOOD LEFT HAND  Result Value Ref Range Status   Specimen Description BLOOD LEFT HAND  Final   Special Requests   Final    BOTTLES DRAWN AEROBIC AND ANAEROBIC Blood Culture adequate volume   Culture   Final    NO GROWTH 5 DAYS Performed at [facility], [place] [address] [place], [place] [address]    Report Status 09/10/2020 FINAL  Final  Aerobic/Anaerobic Culture (surgical/deep wound)     Status: None   Collection Time: 09/07/20  4:09 PM   Specimen: Pleural Fluid  Result Value Ref Range Status   Specimen Description FLUID RIGHT PLEURAL  Final   Special Requests Normal  Final   Gram Stain   Final    MODERATE WBC PRESENT,BOTH PMN AND MONONUCLEAR NO ORGANISMS SEEN    Culture   Final    No growth aerobically or anaerobically. Performed at [facility], [place] [address], [place], [place] [address]    Report Status 09/14/2020 FINAL  Final    Coagulation Studies: No results for input(s): LABPROT, INR in the last 72 hours.  Urinalysis: No results for input(s): COLORURINE, LABSPEC, PHURINE, GLUCOSEU, HGBUR, BILIRUBINUR, KETONESUR, PROTEINUR, UROBILINOGEN, NITRITE, LEUKOCYTESUR in the last 72 hours.  Invalid input(s): APPERANCEUR    Imaging: No results found.   Medications:       Assessment/ Plan:  79 y.o. male with a PMHx of hyperlipidemia, hypertension, coronary artery disease, chronic diastolic heart failure, COPD, diabetes mellitus type 2, who was admitted to [facility] on 08/30/2020 for ongoing treatment of acute respiratory failure.  1.  Acute kidney injury/chronic kidney disease stage IIIB baseline EGFR 38.  Suspect acute kidney injury now related to concurrent illness and dehydration.   -At last visit  renal function had improved.  BUN was down to 107 with a creatinine of 1.73.  Recommend rechecking renal function today.  2.  Anemia of chronic kidney disease.  Hemoglobin currently 8.1.  Continue to monitor closely.  3.  Hypernatremia.  Most recent serum sodium was down to 141.  Continue to monitor.  Now off of D5W.   LOS: 0 Refael Fulop 9/29/20218:36 AM

## 2020-09-16 NOTE — Progress Notes (Addendum)
Pulmonary Critical Care Medicine [facility] [place]   PULMONARY CRITICAL CARE SERVICE  PROGRESS NOTE  Date of Service: 09/16/2020  [name]  JJK:[id number]  DOB: 01-Apr-1941   DOA: 08/30/2020  Referring Physician: Carron Curie, MD  HPI: Eric Knapp is a 79 y.o. male seen for follow up of Acute on Chronic Respiratory Failure. Patient has a 16-hour goal today on aerosol trach collar 35% currently satting well no distress.  Medications: Reviewed on Rounds  Physical Exam:  Vitals: Pulse 72 respirations 29 BP 119/54 O2 sat 100% temp 97.5  Ventilator Settings ATC 35%  . General: Comfortable at this time . Eyes: Grossly normal lids, irises & conjunctiva . ENT: grossly tongue is normal . Neck: no obvious mass . Cardiovascular: S1 S2 normal no gallop . Respiratory: No rales or rhonchi noted . Abdomen: soft . Skin: no rash seen on limited exam . Musculoskeletal: not rigid . Psychiatric:unable to assess . Neurologic: no seizure no involuntary movements         Lab Data:   Basic Metabolic Panel: Recent Labs  Lab 09/09/20 1947 09/10/20 1001 09/14/20 0936 09/15/20 0801  NA 151* 152* 141  --   K 4.8 5.1 5.6* 5.1  CL 109 113* 109  --   CO2 29 30 24   --   GLUCOSE 150* 200* 182*  --   BUN 147* 145* 107*  --   CREATININE 2.43* 2.33* 1.73*  --   CALCIUM 8.4* 8.2* 8.1*  --   PHOS 3.0  --  3.2  --     ABG: No results for input(s): PHART, PCO2ART, PO2ART, HCO3, O2SAT in the last 168 hours.  Liver Function Tests: Recent Labs  Lab 09/09/20 1947 09/14/20 0936  ALBUMIN 1.8* 1.7*   No results for input(s): LIPASE, AMYLASE in the last 168 hours. No results for input(s): AMMONIA in the last 168 hours.  CBC: Recent Labs  Lab 09/10/20 1001 09/14/20 0936  WBC 6.8 8.0  HGB 8.4* 8.1*  HCT 28.5* 27.3*  MCV 93.8 93.5  PLT 183 187    Cardiac Enzymes: No results for input(s): CKTOTAL, CKMB, CKMBINDEX, TROPONINI in the last 168 hours.  BNP (last 3  results) Recent Labs    09/04/20 1646 09/05/20 1007  BNP 389.9* 458.9*    ProBNP (last 3 results) No results for input(s): PROBNP in the last 8760 hours.  Radiological Exams: No results found.  Assessment/Plan Active Problems:   Acute on chronic respiratory failure with hypoxia (HCC)   Chronic combined systolic and diastolic heart failure (HCC)   COPD, severe (HCC)   Chronic atrial fibrillation (HCC)   Metabolic encephalopathy   Pleural effusion, right   1. Acute on chronic respiratory failure hypoxia  patient 16-hour goal today on aerosol trach collar 35% we'll continue secretion management supportive measures. 2. Chronic combined systolic diastolic heart failure monitoring fluid status we will continue to monitor nephrology is also following the patient 3. Severe COPD medical management we will continue to follow 4. Chronic atrial fibrillation rate is controlled 5. Metabolic encephalopathy no change 6. Pleural effusion follow radiologically   I have personally seen and evaluated the patient, evaluated laboratory and imaging results, formulated the assessment and plan and placed orders. The Patient requires high complexity decision making with multiple systems involvement.  Rounds were done with the Respiratory Therapy Director and Staff therapists and discussed with nursing staff also.  09/07/20, MD [facility] Pulmonary Critical Care Medicine Sleep Medicine

## 2020-09-16 NOTE — Progress Notes (Signed)
Referring Physician(s): Dr. Manson Passey ([facility])  Supervising Physician: Dr. Elby Showers  Patient Status:  [facility]  Chief Complaint: Right loculated pleural effusion/empyema s/p right chest tube placement in IR 09/07/2020.  Subjective: Patient laying in bed, eyes open, tracheostomy in place. Does not follow simple commands. Right chest tube site c/d/i. His daughter is at the bedside.   Allergies: Patient has no allergy information on record.  Medications: Prior to Admission medications   Not on File     Vital Signs: BP (!) 92/39 (BP Location: Right Arm)   Pulse 82   Resp 12   SpO2 100%   Physical Exam Pulmonary:     Effort: Pulmonary effort is normal.     Comments: Tracheostomy; currently on trach collar. Copious secretions.  Right chest tube intact; to suction. Dressing is clean and dry. Small air leak observed in chamber. Serosanguineous fluid in chamber.  Skin:    General: Skin is warm and dry.  Neurological:     Mental Status: He is alert.     Comments: Unable to assess.     Imaging: DG Abd Portable 1V  Result Date: 09/13/2020 CLINICAL DATA:  Ileus EXAM: PORTABLE ABDOMEN - 1 VIEW COMPARISON:  09/07/2020 FINDINGS: Examination is technically limited due to exposure parameters. There are 2 pigtail drainage catheters projected in or over the right upper quadrant. A biliary stent is present. A gastrostomy tube is present. Scattered gas and stool in the colon. No small or large bowel distention. Cardiac enlargement. Lung bases are clear. IMPRESSION: Technically limited study. Nonobstructive bowel gas pattern. Electronically Signed   By: Burman Nieves M.D.   On: 09/13/2020 02:49    Labs:  CBC: Recent Labs    09/08/20 0914 09/08/20 1416 09/10/20 1001 09/14/20 0936  WBC 6.8 6.4 6.8 8.0  HGB 8.2* 7.9* 8.4* 8.1*  HCT 27.9* 26.8* 28.5* 27.3*  PLT 180 172 183 187    COAGS: Recent Labs    09/01/20 0955  INR 1.7*    BMP: Recent Labs    09/09/20 1947 09/09/20 1947  09/10/20 1001 09/14/20 0936 09/15/20 0801 09/16/20 1142  NA 151*  --  152* 141  --  138  K 4.8   < > 5.1 5.6* 5.1 5.9*  CL 109  --  113* 109  --  105  CO2 29  --  30 24  --  25  GLUCOSE 150*  --  200* 182*  --  159*  BUN 147*  --  145* 107*  --  101*  CALCIUM 8.4*  --  8.2* 8.1*  --  7.9*  CREATININE 2.43*  --  2.33* 1.73*  --  1.77*  GFRNONAA 25*  --  26* 38*  --  36*  GFRAA 29*  --  30* 43*  --  42*   < > = values in this interval not displayed.    LIVER FUNCTION TESTS: Recent Labs    09/01/20 0955 09/01/20 0955 09/08/20 0914 09/09/20 1947 09/14/20 0936 09/16/20 1142  BILITOT 1.1  --   --   --   --   --   AST 45*  --   --   --   --   --   ALT 44  --   --   --   --   --   ALKPHOS 106  --   --   --   --   --   PROT 6.4*  --   --   --   --   --  ALBUMIN 2.1*   < > 1.9* 1.8* 1.7* 1.7*   < > = values in this interval not displayed.    Assessment and Plan:  Right loculated pleural effusion/empyema s/p right chest tube placement in IR 09/07/2020: Right chest tube stable to suction. Continue current tube management- tube to remain to suction at this time. Plan for repeat CT chest when output diminishes. Further plans per [facility]- appreciate and agree with management.  IR to follow.  Electronically Signed: Alwyn Ren, AGACNP-BC [phone] 09/16/2020, 4:19 PM   I spent a total of 15 Minutes at the the patient's bedside AND on the patient's hospital floor or unit, greater than 50% of which was counseling/coordinating care for right chest tube.

## 2020-09-17 DIAGNOSIS — J449 Chronic obstructive pulmonary disease, unspecified: Secondary | ICD-10-CM | POA: Diagnosis not present

## 2020-09-17 DIAGNOSIS — I482 Chronic atrial fibrillation, unspecified: Secondary | ICD-10-CM | POA: Diagnosis not present

## 2020-09-17 DIAGNOSIS — J9621 Acute and chronic respiratory failure with hypoxia: Secondary | ICD-10-CM | POA: Diagnosis not present

## 2020-09-17 DIAGNOSIS — I5042 Chronic combined systolic (congestive) and diastolic (congestive) heart failure: Secondary | ICD-10-CM | POA: Diagnosis not present

## 2020-09-17 LAB — POTASSIUM: Potassium: 4.7 mmol/L (ref 3.5–5.1)

## 2020-09-17 NOTE — Progress Notes (Addendum)
Pulmonary Critical Care Medicine [facility] [place]   PULMONARY CRITICAL CARE SERVICE  PROGRESS NOTE  Date of Service: 09/17/2020  Eric Knapp  Eric Knapp  DOB: [birth year]   DOA: 08/28/2020  Referring Physician: Carron Curie, MD  HPI: Eric Knapp is a 79 y.o. male seen for follow up of Acute on Chronic Respiratory Failure.  Patient is a 16-hour aerosol trach collar goal today on 28% was only managed to do 7 hours yesterday.  Medications: Reviewed on Rounds  Physical Exam:  Vitals: Pulse 75 respirations 22 BP 104/57 O2 sat 99% temp 96.0  Ventilator Settings aerosol trach collar 28%   . General: Comfortable at this time . Eyes: Grossly normal lids, irises & conjunctiva . ENT: grossly tongue is normal . Neck: no obvious mass . Cardiovascular: S1 S2 normal no gallop . Respiratory: No rales or rhonchi noted . Abdomen: soft . Skin: no rash seen on limited exam . Musculoskeletal: not rigid . Psychiatric:unable to assess . Neurologic: no seizure no involuntary movements         Lab Data:   Basic Metabolic Panel: Recent Labs  Lab 09/14/20 0936 09/15/20 0801 09/16/20 1142 09/17/20 0456  NA 141  --  138  --   K 5.6* 5.1 5.9* 4.7  CL 109  --  105  --   CO2 24  --  25  --   GLUCOSE 182*  --  159*  --   BUN 107*  --  101*  --   CREATININE 1.73*  --  1.77*  --   CALCIUM 8.1*  --  7.9*  --   PHOS 3.2  --  3.7  --     ABG: No results for input(s): PHART, PCO2ART, PO2ART, HCO3, O2SAT in the last 168 hours.  Liver Function Tests: Recent Labs  Lab 09/14/20 0936 09/16/20 1142  ALBUMIN 1.7* 1.7*   No results for input(s): LIPASE, AMYLASE in the last 168 hours. No results for input(s): AMMONIA in the last 168 hours.  CBC: Recent Labs  Lab 09/14/20 0936  WBC 8.0  HGB 8.1*  HCT 27.3*  MCV 93.5  PLT 187    Cardiac Enzymes: No results for input(s): CKTOTAL, CKMB, CKMBINDEX, TROPONINI in the last 168 hours.  BNP (last 3 results) Recent Labs     09/04/20 1646 09/05/20 1007  BNP 389.9* 458.9*    ProBNP (last 3 results) No results for input(s): PROBNP in the last 8760 hours.  Radiological Exams: No results found.  Assessment/Plan Active Problems:   Acute on chronic respiratory failure with hypoxia (HCC)   Chronic combined systolic and diastolic heart failure (HCC)   COPD, severe (HCC)   Chronic atrial fibrillation (HCC)   Metabolic encephalopathy   Pleural effusion, right   1. Acute on chronic respiratory failure hypoxia patient 16-hour goal today on aerosol trach collar 28% we'll continue secretion management supportive measures. 2. Chronic combined systolic diastolic heart failure monitoring fluid status we will continue to monitor nephrology is also following the patient 3. Severe COPD medical management we will continue to follow 4. Chronic atrial fibrillation rate is controlled 5. Metabolic encephalopathy no change 6. Pleural effusion follow radiologically   I have personally seen and evaluated the patient, evaluated laboratory and imaging results, formulated the assessment and plan and placed orders. The Patient requires high complexity decision making with multiple systems involvement.  Rounds were done with the Respiratory Therapy Director and Staff therapists and discussed with nursing staff also.  Yevonne Pax, MD  [facility] Pulmonary Critical Care Medicine Sleep Medicine

## 2020-09-18 DIAGNOSIS — J449 Chronic obstructive pulmonary disease, unspecified: Secondary | ICD-10-CM | POA: Diagnosis not present

## 2020-09-18 DIAGNOSIS — I5042 Chronic combined systolic (congestive) and diastolic (congestive) heart failure: Secondary | ICD-10-CM | POA: Diagnosis not present

## 2020-09-18 DIAGNOSIS — J9621 Acute and chronic respiratory failure with hypoxia: Secondary | ICD-10-CM | POA: Diagnosis not present

## 2020-09-18 DIAGNOSIS — I482 Chronic atrial fibrillation, unspecified: Secondary | ICD-10-CM | POA: Diagnosis not present

## 2020-09-18 LAB — BASIC METABOLIC PANEL
Anion gap: 10 (ref 5–15)
BUN: 98 mg/dL — ABNORMAL HIGH (ref 8–23)
CO2: 27 mmol/L (ref 22–32)
Calcium: 8.1 mg/dL — ABNORMAL LOW (ref 8.9–10.3)
Chloride: 106 mmol/L (ref 98–111)
Creatinine, Ser: 1.79 mg/dL — ABNORMAL HIGH (ref 0.61–1.24)
GFR calc Af Amer: 42 mL/min — ABNORMAL LOW (ref >60)
GFR calc non Af Amer: 36 mL/min — ABNORMAL LOW (ref >60)
Glucose, Bld: 128 mg/dL — ABNORMAL HIGH (ref 70–99)
Potassium: 5.1 mmol/L (ref 3.5–5.1)
Sodium: 143 mmol/L (ref 135–145)

## 2020-09-18 LAB — CBC
HCT: 28.1 % — ABNORMAL LOW (ref 39.0–52.0)
Hemoglobin: 8.3 g/dL — ABNORMAL LOW (ref 13.0–17.0)
MCH: 27.4 pg (ref 26.0–34.0)
MCHC: 29.5 g/dL — ABNORMAL LOW (ref 30.0–36.0)
MCV: 92.7 fL (ref 80.0–100.0)
Platelets: 228 10*3/uL (ref 150–400)
RBC: 3.03 MIL/uL — ABNORMAL LOW (ref 4.22–5.81)
RDW: 17.2 % — ABNORMAL HIGH (ref 11.5–15.5)
WBC: 9.9 10*3/uL (ref 4.0–10.5)
nRBC: 0 % (ref 0.0–0.2)

## 2020-09-18 NOTE — Progress Notes (Addendum)
Pulmonary Critical Care Medicine [facility] [place]   PULMONARY CRITICAL CARE SERVICE  PROGRESS NOTE  Date of Service: 09/18/2020  [name]  OAC:[id number]  DOB: 09-Mar-1941   DOA: 2020-09-29  Referring Physician: Carron Curie, MD  HPI: Eric Knapp is a 79 y.o. male seen for follow up of Acute on Chronic Respiratory Failure.  Patient remains on aerosol trach collar 28% FiO2 as tolerated using PMV satting well no distress.  Medications: Reviewed on Rounds  Physical Exam:  Vitals: Pulse 84 respirations 32 BP 144/80 O2 sat 98% temp 96.0  Ventilator Settings ATC 28%  . General: Comfortable at this time . Eyes: Grossly normal lids, irises & conjunctiva . ENT: grossly tongue is normal . Neck: no obvious mass . Cardiovascular: S1 S2 normal no gallop . Respiratory: No rales or rhonchi noted . Abdomen: soft . Skin: no rash seen on limited exam . Musculoskeletal: not rigid . Psychiatric:unable to assess . Neurologic: no seizure no involuntary movements         Lab Data:   Basic Metabolic Panel: Recent Labs  Lab 09/14/20 0936 09/15/20 0801 09/16/20 1142 09/17/20 0456 09/18/20 0828  NA 141  --  138  --  143  K 5.6* 5.1 5.9* 4.7 5.1  CL 109  --  105  --  106  CO2 24  --  25  --  27  GLUCOSE 182*  --  159*  --  128*  BUN 107*  --  101*  --  98*  CREATININE 1.73*  --  1.77*  --  1.79*  CALCIUM 8.1*  --  7.9*  --  8.1*  PHOS 3.2  --  3.7  --   --     ABG: No results for input(s): PHART, PCO2ART, PO2ART, HCO3, O2SAT in the last 168 hours.  Liver Function Tests: Recent Labs  Lab 09/14/20 0936 09/16/20 1142  ALBUMIN 1.7* 1.7*   No results for input(s): LIPASE, AMYLASE in the last 168 hours. No results for input(s): AMMONIA in the last 168 hours.  CBC: Recent Labs  Lab 09/14/20 0936 09/18/20 1149  WBC 8.0 9.9  HGB 8.1* 8.3*  HCT 27.3* 28.1*  MCV 93.5 92.7  PLT 187 228    Cardiac Enzymes: No results for input(s): CKTOTAL, CKMB, CKMBINDEX,  TROPONINI in the last 168 hours.  BNP (last 3 results) Recent Labs    09/04/20 1646 09/05/20 1007  BNP 389.9* 458.9*    ProBNP (last 3 results) No results for input(s): PROBNP in the last 8760 hours.  Radiological Exams: No results found.  Assessment/Plan Active Problems:   Acute on chronic respiratory failure with hypoxia (HCC)   Chronic combined systolic and diastolic heart failure (HCC)   COPD, severe (HCC)   Chronic atrial fibrillation (HCC)   Metabolic encephalopathy   Pleural effusion, right   1. Acute on chronic respiratory failure hypoxiapatient will continue on aerosol trach collar 28% as tolerated using PMV at this time we will continue supportive measures aggressive pulmonary toilet and secretion management. 2. Chronic combined systolic diastolic heart failure monitoring fluid status we will continue to monitor nephrology is also following the patient 3. Severe COPD medical management we will continue to follow 4. Chronic atrial fibrillation rate is controlled 5. Metabolic encephalopathy no change 6. Pleural effusion follow radiologically   I have personally seen and evaluated the patient, evaluated laboratory and imaging results, formulated the assessment and plan and placed orders. The Patient requires high complexity decision making with multiple systems  involvement.  Rounds were done with the Respiratory Therapy Director and Staff therapists and discussed with nursing staff also.  Allyne Gee, MD [facility] Pulmonary Critical Care Medicine Sleep Medicine

## 2020-09-18 DEATH — deceased

## 2020-09-19 DIAGNOSIS — J9621 Acute and chronic respiratory failure with hypoxia: Secondary | ICD-10-CM | POA: Diagnosis not present

## 2020-09-19 DIAGNOSIS — J449 Chronic obstructive pulmonary disease, unspecified: Secondary | ICD-10-CM | POA: Diagnosis not present

## 2020-09-19 DIAGNOSIS — I482 Chronic atrial fibrillation, unspecified: Secondary | ICD-10-CM | POA: Diagnosis not present

## 2020-09-19 DIAGNOSIS — I5042 Chronic combined systolic (congestive) and diastolic (congestive) heart failure: Secondary | ICD-10-CM | POA: Diagnosis not present

## 2020-09-19 LAB — BLOOD GAS, ARTERIAL
Acid-Base Excess: 3.3 mmol/L — ABNORMAL HIGH (ref 0.0–2.0)
Bicarbonate: 27.8 mmol/L (ref 20.0–28.0)
FIO2: 28
O2 Saturation: 95 %
Patient temperature: 37
pCO2 arterial: 46.3 mmHg (ref 32.0–48.0)
pH, Arterial: 7.395 (ref 7.350–7.450)
pO2, Arterial: 73.2 mmHg — ABNORMAL LOW (ref 83.0–108.0)

## 2020-09-19 NOTE — Progress Notes (Signed)
Pulmonary Critical Care Medicine [facility] [place]   PULMONARY CRITICAL CARE SERVICE  PROGRESS NOTE  Date of Service: 09/19/2020  [name]  [id number]:[id number]  DOB: 1941/04/25   DOA: 2020/09/30  Referring Physician: Carron Curie, MD  HPI: Eric Knapp is a 79 y.o. male seen for follow up of Acute on Chronic Respiratory Failure.  Patient currently is on 28% FiO2 with a goal of 24 hours  Medications: Reviewed on Rounds  Physical Exam:  Vitals: Temperature is 96.0 pulse 104 respiratory 34 blood pressure is 143/59 saturations 94%  Ventilator Settings on T collar with an FiO2 28%  . General: Comfortable at this time . Eyes: Grossly normal lids, irises & conjunctiva . ENT: grossly tongue is normal . Neck: no obvious mass . Cardiovascular: S1 S2 normal no gallop . Respiratory: No rhonchi no rales noted at this time . Abdomen: soft . Skin: no rash seen on limited exam . Musculoskeletal: not rigid . Psychiatric:unable to assess . Neurologic: no seizure no involuntary movements         Lab Data:   Basic Metabolic Panel: Recent Labs  Lab 09/14/20 0936 09/15/20 0801 09/16/20 1142 09/17/20 0456 09/18/20 0828  NA 141  --  138  --  143  K 5.6* 5.1 5.9* 4.7 5.1  CL 109  --  105  --  106  CO2 24  --  25  --  27  GLUCOSE 182*  --  159*  --  128*  BUN 107*  --  101*  --  98*  CREATININE 1.73*  --  1.77*  --  1.79*  CALCIUM 8.1*  --  7.9*  --  8.1*  PHOS 3.2  --  3.7  --   --     ABG: No results for input(s): PHART, PCO2ART, PO2ART, HCO3, O2SAT in the last 168 hours.  Liver Function Tests: Recent Labs  Lab 09/14/20 0936 09/16/20 1142  ALBUMIN 1.7* 1.7*   No results for input(s): LIPASE, AMYLASE in the last 168 hours. No results for input(s): AMMONIA in the last 168 hours.  CBC: Recent Labs  Lab 09/14/20 0936 09/18/20 1149  WBC 8.0 9.9  HGB 8.1* 8.3*  HCT 27.3* 28.1*  MCV 93.5 92.7  PLT 187 228    Cardiac Enzymes: No results for input(s): CKTOTAL,  CKMB, CKMBINDEX, TROPONINI in the last 168 hours.  BNP (last 3 results) Recent Labs    09/04/20 1646 09/05/20 1007  BNP 389.9* 458.9*    ProBNP (last 3 results) No results for input(s): PROBNP in the last 8760 hours.  Radiological Exams: No results found.  Assessment/Plan Active Problems:   Acute on chronic respiratory failure with hypoxia (HCC)   Chronic combined systolic and diastolic heart failure (HCC)   COPD, severe (HCC)   Chronic atrial fibrillation (HCC)   Metabolic encephalopathy   Pleural effusion, right   1. Acute on chronic respiratory failure with hypoxia we will continue with T collar trials patient's goal is 24 hours 2. Chronic combined systolic diastolic heart failure no change we will continue to follow 3. Severe COPD at baseline continue present management 4. Chronic atrial fibrillation rate controlled 5. Metabolic encephalopathy no change 6. Pleural effusion no change we will continue to follow   I have personally seen and evaluated the patient, evaluated laboratory and imaging results, formulated the assessment and plan and placed orders. The Patient requires high complexity decision making with multiple systems involvement.  Rounds were done with the Respiratory Therapy Director and  Staff therapists and discussed with nursing staff also.  Allyne Gee, MD [facility] Pulmonary Critical Care Medicine Sleep Medicine

## 2020-09-20 DIAGNOSIS — I482 Chronic atrial fibrillation, unspecified: Secondary | ICD-10-CM | POA: Diagnosis not present

## 2020-09-20 DIAGNOSIS — I5042 Chronic combined systolic (congestive) and diastolic (congestive) heart failure: Secondary | ICD-10-CM | POA: Diagnosis not present

## 2020-09-20 DIAGNOSIS — J449 Chronic obstructive pulmonary disease, unspecified: Secondary | ICD-10-CM | POA: Diagnosis not present

## 2020-09-20 DIAGNOSIS — J9621 Acute and chronic respiratory failure with hypoxia: Secondary | ICD-10-CM | POA: Diagnosis not present

## 2020-09-20 LAB — BASIC METABOLIC PANEL
Anion gap: 11 (ref 5–15)
BUN: 95 mg/dL — ABNORMAL HIGH (ref 8–23)
CO2: 25 mmol/L (ref 22–32)
Calcium: 8.4 mg/dL — ABNORMAL LOW (ref 8.9–10.3)
Chloride: 104 mmol/L (ref 98–111)
Creatinine, Ser: 1.83 mg/dL — ABNORMAL HIGH (ref 0.61–1.24)
GFR calc Af Amer: 41 mL/min — ABNORMAL LOW (ref >60)
GFR calc non Af Amer: 35 mL/min — ABNORMAL LOW (ref >60)
Glucose, Bld: 104 mg/dL — ABNORMAL HIGH (ref 70–99)
Potassium: 5.2 mmol/L — ABNORMAL HIGH (ref 3.5–5.1)
Sodium: 140 mmol/L (ref 135–145)

## 2020-09-20 LAB — POTASSIUM: Potassium: 4.7 mmol/L (ref 3.5–5.1)

## 2020-09-20 NOTE — Progress Notes (Signed)
Pulmonary Critical Care Medicine [facility] [place]   PULMONARY CRITICAL CARE SERVICE  PROGRESS NOTE  Date of Service: 09/20/2020  Eric Knapp  XHB:[id number]  DOB: 01-Apr-1941   DOA: 09/13/2020  Referring Physician: Carron Curie, MD  HPI: Eric Knapp is a 79 y.o. male seen for follow up of Acute on Chronic Respiratory Failure.  Patient currently is on T collar has been on 28% FiO2 was able to complete 48 hours  Medications: Reviewed on Rounds  Physical Exam:  Vitals: On T collar FiO2 28% patient's temp was 98.0 pulse 86 respiratory rate 30 blood pressure 108/67 saturations 95%  Ventilator Settings T collar 28%  . General: Comfortable at this time . Eyes: Grossly normal lids, irises & conjunctiva . ENT: grossly tongue is normal . Neck: no obvious mass . Cardiovascular: S1 S2 normal no gallop . Respiratory: No rhonchi no rales . Abdomen: soft . Skin: no rash seen on limited exam . Musculoskeletal: not rigid . Psychiatric:unable to assess . Neurologic: no seizure no involuntary movements         Lab Data:   Basic Metabolic Panel: Recent Labs  Lab 09/14/20 0936 09/14/20 0936 09/15/20 0801 09/16/20 1142 09/17/20 0456 09/18/20 0828 09/20/20 0438  NA 141  --   --  138  --  143 140  K 5.6*   < > 5.1 5.9* 4.7 5.1 5.2*  CL 109  --   --  105  --  106 104  CO2 24  --   --  25  --  27 25  GLUCOSE 182*  --   --  159*  --  128* 104*  BUN 107*  --   --  101*  --  98* 95*  CREATININE 1.73*  --   --  1.77*  --  1.79* 1.83*  CALCIUM 8.1*  --   --  7.9*  --  8.1* 8.4*  PHOS 3.2  --   --  3.7  --   --   --    < > = values in this interval not displayed.    ABG: Recent Labs  Lab 09/19/20 1905  PHART 7.395  PCO2ART 46.3  PO2ART 73.2*  HCO3 27.8  O2SAT 95.0    Liver Function Tests: Recent Labs  Lab 09/14/20 0936 09/16/20 1142  ALBUMIN 1.7* 1.7*   No results for input(s): LIPASE, AMYLASE in the last 168 hours. No results for input(s): AMMONIA in the last  168 hours.  CBC: Recent Labs  Lab 09/14/20 0936 09/18/20 1149  WBC 8.0 9.9  HGB 8.1* 8.3*  HCT 27.3* 28.1*  MCV 93.5 92.7  PLT 187 228    Cardiac Enzymes: No results for input(s): CKTOTAL, CKMB, CKMBINDEX, TROPONINI in the last 168 hours.  BNP (last 3 results) Recent Labs    09/04/20 1646 09/05/20 1007  BNP 389.9* 458.9*    ProBNP (last 3 results) No results for input(s): PROBNP in the last 8760 hours.  Radiological Exams: No results found.  Assessment/Plan Active Problems:   Acute on chronic respiratory failure with hypoxia (HCC)   Chronic combined systolic and diastolic heart failure (HCC)   COPD, severe (HCC)   Chronic atrial fibrillation (HCC)   Metabolic encephalopathy   Pleural effusion, right   1. Acute on chronic respiratory failure hypoxia we will continue with T collar trials titrate oxygen continue pulmonary toilet. 2. Chronic combined systolic diastolic heart failure compensated monitor fluid status. 3. Metabolic encephalopathy no change we will continue to follow 4.  Severe COPD continue with medical management 5. Chronic atrial fibrillation rate is controlled 6. Pleural effusion following x-rays   I have personally seen and evaluated the patient, evaluated laboratory and imaging results, formulated the assessment and plan and placed orders. The Patient requires high complexity decision making with multiple systems involvement.  Rounds were done with the Respiratory Therapy Director and Staff therapists and discussed with nursing staff also.  Yevonne Pax, MD [facility] Pulmonary Critical Care Medicine Sleep Medicine

## 2020-09-21 DIAGNOSIS — I482 Chronic atrial fibrillation, unspecified: Secondary | ICD-10-CM | POA: Diagnosis not present

## 2020-09-21 DIAGNOSIS — J449 Chronic obstructive pulmonary disease, unspecified: Secondary | ICD-10-CM | POA: Diagnosis not present

## 2020-09-21 DIAGNOSIS — J9621 Acute and chronic respiratory failure with hypoxia: Secondary | ICD-10-CM | POA: Diagnosis not present

## 2020-09-21 DIAGNOSIS — I5042 Chronic combined systolic (congestive) and diastolic (congestive) heart failure: Secondary | ICD-10-CM | POA: Diagnosis not present

## 2020-09-21 NOTE — Progress Notes (Signed)
Pulmonary Critical Care Medicine [facility] [place]   PULMONARY CRITICAL CARE SERVICE  PROGRESS NOTE  Date of Service: 09/21/2020  Eric Knapp  POE:[id number]  DOB: February 13, 1941   DOA: Sep 28, 2020  Referring Physician: Carron Curie, MD  HPI: Eric Knapp is a 79 y.o. male seen for follow up of Acute on Chronic Respiratory Failure.  Patient is weaning on T collar has been on 48 hours plan is to continue to advance  Medications: Reviewed on Rounds  Physical Exam:  Vitals: Temperature is 96.2 pulse 95 respiratory 26 blood pressure is 107/49 saturations 94%  Ventilator Settings on T collar  . General: Comfortable at this time . Eyes: Grossly normal lids, irises & conjunctiva . ENT: grossly tongue is normal . Neck: no obvious mass . Cardiovascular: S1 S2 normal no gallop . Respiratory: No rhonchi no rales are noted . Abdomen: soft . Skin: no rash seen on limited exam . Musculoskeletal: not rigid . Psychiatric:unable to assess . Neurologic: no seizure no involuntary movements         Lab Data:   Basic Metabolic Panel: Recent Labs  Lab 09/16/20 1142 09/17/20 0456 09/18/20 0828 09/20/20 0438 09/20/20 2245  NA 138  --  143 140  --   K 5.9* 4.7 5.1 5.2* 4.7  CL 105  --  106 104  --   CO2 25  --  27 25  --   GLUCOSE 159*  --  128* 104*  --   BUN 101*  --  98* 95*  --   CREATININE 1.77*  --  1.79* 1.83*  --   CALCIUM 7.9*  --  8.1* 8.4*  --   PHOS 3.7  --   --   --   --     ABG: Recent Labs  Lab 09/19/20 1905  PHART 7.395  PCO2ART 46.3  PO2ART 73.2*  HCO3 27.8  O2SAT 95.0    Liver Function Tests: Recent Labs  Lab 09/16/20 1142  ALBUMIN 1.7*   No results for input(s): LIPASE, AMYLASE in the last 168 hours. No results for input(s): AMMONIA in the last 168 hours.  CBC: Recent Labs  Lab 09/18/20 1149  WBC 9.9  HGB 8.3*  HCT 28.1*  MCV 92.7  PLT 228    Cardiac Enzymes: No results for input(s): CKTOTAL, CKMB, CKMBINDEX, TROPONINI in the last  168 hours.  BNP (last 3 results) Recent Labs    09/04/20 1646 09/05/20 1007  BNP 389.9* 458.9*    ProBNP (last 3 results) No results for input(s): PROBNP in the last 8760 hours.  Radiological Exams: No results found.  Assessment/Plan Active Problems:   Acute on chronic respiratory failure with hypoxia (HCC)   Chronic combined systolic and diastolic heart failure (HCC)   COPD, severe (HCC)   Chronic atrial fibrillation (HCC)   Metabolic encephalopathy   Pleural effusion, right   1. Acute on chronic respiratory failure with hypoxia we will continue with T collar wean goal is 48 hours. 2. Chronic combined systolic diastolic heart failure compensated 3. Severe COPD at baseline 4. Chronic atrial fibrillation rate is controlled 5. Metabolic encephalopathy slow improvement 6. Pleural effusion follow radiologically   I have personally seen and evaluated the patient, evaluated laboratory and imaging results, formulated the assessment and plan and placed orders. The Patient requires high complexity decision making with multiple systems involvement.  Rounds were done with the Respiratory Therapy Director and Staff therapists and discussed with nursing staff also.  Yevonne Pax, MD [facility]  Pulmonary Critical Care Medicine Sleep Medicine

## 2020-09-22 ENCOUNTER — Other Ambulatory Visit (HOSPITAL_COMMUNITY): Payer: Medicare Other

## 2020-09-22 DIAGNOSIS — I5042 Chronic combined systolic (congestive) and diastolic (congestive) heart failure: Secondary | ICD-10-CM | POA: Diagnosis not present

## 2020-09-22 DIAGNOSIS — I482 Chronic atrial fibrillation, unspecified: Secondary | ICD-10-CM | POA: Diagnosis not present

## 2020-09-22 DIAGNOSIS — J449 Chronic obstructive pulmonary disease, unspecified: Secondary | ICD-10-CM | POA: Diagnosis not present

## 2020-09-22 DIAGNOSIS — J9621 Acute and chronic respiratory failure with hypoxia: Secondary | ICD-10-CM | POA: Diagnosis not present

## 2020-09-22 LAB — BASIC METABOLIC PANEL
Anion gap: 14 (ref 5–15)
BUN: 94 mg/dL — ABNORMAL HIGH (ref 8–23)
CO2: 23 mmol/L (ref 22–32)
Calcium: 8.1 mg/dL — ABNORMAL LOW (ref 8.9–10.3)
Chloride: 101 mmol/L (ref 98–111)
Creatinine, Ser: 1.9 mg/dL — ABNORMAL HIGH (ref 0.61–1.24)
GFR calc Af Amer: 39 mL/min — ABNORMAL LOW (ref >60)
GFR calc non Af Amer: 33 mL/min — ABNORMAL LOW (ref >60)
Glucose, Bld: 119 mg/dL — ABNORMAL HIGH (ref 70–99)
Potassium: 5.1 mmol/L (ref 3.5–5.1)
Sodium: 138 mmol/L (ref 135–145)

## 2020-09-22 LAB — CBC
HCT: 24.5 % — ABNORMAL LOW (ref 39.0–52.0)
Hemoglobin: 7.1 g/dL — ABNORMAL LOW (ref 13.0–17.0)
MCH: 27.7 pg (ref 26.0–34.0)
MCHC: 29 g/dL — ABNORMAL LOW (ref 30.0–36.0)
MCV: 95.7 fL (ref 80.0–100.0)
Platelets: 253 10*3/uL (ref 150–400)
RBC: 2.56 MIL/uL — ABNORMAL LOW (ref 4.22–5.81)
RDW: 19.4 % — ABNORMAL HIGH (ref 11.5–15.5)
WBC: 8.7 10*3/uL (ref 4.0–10.5)
nRBC: 0 % (ref 0.0–0.2)

## 2020-09-22 NOTE — Progress Notes (Signed)
Referring Physician(s): Dr. Manson Passey [facility])  Supervising Physician: Simonne Come  Patient Status:  [facility] - In-pt  Chief Complaint: Right loculated pleural effusion/empyema s/p right chest tube placement in IR 09/07/2020.  Subjective: Patient laying in bed, eyes open, tracheostomy in place. Does not follow simple commands. Right chest tube site unremarkable. No family at bedside.   Medications: Prior to Admission medications   Not on File     Vital Signs: Temp: 96.2; HR 95, BP 107/49, RR 26, 94% on trach collar   Physical Exam Constitutional:      General: He is not in acute distress. Pulmonary:     Effort: Pulmonary effort is normal.     Comments: Tracheostomy/trach collar. Right chest tube with serosanguineous fluid in Pleur evac. Dressing is loose with some drainage. Skin insertion site is without erythema or tenderness. Sutures intact.  Abdominal:     Comments: RUQ drain intact with scant amount of brown drainage in collection container.   Skin:    General: Skin is warm and dry.  Neurological:     Mental Status: He is alert.     Comments: Unable to assess.      Imaging: No results found.  Labs:  CBC: Recent Labs    09/10/20 1001 09/14/20 0936 09/18/20 1149 09/22/20 0604  WBC 6.8 8.0 9.9 8.7  HGB 8.4* 8.1* 8.3* 7.1*  HCT 28.5* 27.3* 28.1* 24.5*  PLT 183 187 228 253    COAGS: Recent Labs    09/01/20 0955  INR 1.7*    BMP: Recent Labs    09/16/20 1142 09/17/20 0456 09/18/20 0828 09/20/20 0438 09/20/20 2245 09/22/20 0604  NA 138  --  143 140  --  138  K 5.9*   < > 5.1 5.2* 4.7 5.1  CL 105  --  106 104  --  101  CO2 25  --  27 25  --  23  GLUCOSE 159*  --  128* 104*  --  119*  BUN 101*  --  98* 95*  --  94*  CALCIUM 7.9*  --  8.1* 8.4*  --  8.1*  CREATININE 1.77*  --  1.79* 1.83*  --  1.90*  GFRNONAA 36*  --  36* 35*  --  33*  GFRAA 42*  --  42* 41*  --  39*   < > = values in this interval not displayed.    LIVER  FUNCTION TESTS: Recent Labs    09/01/20 0955 09/01/20 0955 09/08/20 0914 09/09/20 1947 09/14/20 0936 09/16/20 1142  BILITOT 1.1  --   --   --   --   --   AST 45*  --   --   --   --   --   ALT 44  --   --   --   --   --   ALKPHOS 106  --   --   --   --   --   PROT 6.4*  --   --   --   --   --   ALBUMIN 2.1*   < > 1.9* 1.8* 1.7* 1.7*   < > = values in this interval not displayed.    Assessment and Plan:  Right loculated pleural effusion/empyema s/p right chest tube placement in IR 09/07/2020: Right chest tube is stable. It appears the Pleur-evac was recently changed and not connected to suction; no water in the air leak window. After speaking with Dr. Manson Passey to  see if chest tube management had changed she confirmed that the tube should still be to suction. I notified the bedside RN who said she would connect the chest tube to suction and also put water in the air leak window. Output in the past 24 hours was 100 ml per [facility] chart.   Continue current tube management- tube to remain to suction at this time. Plan for repeat CT chest when output diminishes. CXR ordered today. Further plans per [facility]- appreciate and agree with management.  Electronically Signed: Alwyn Ren, AGACNP-BC [phone] 09/22/2020, 1:43 PM   I spent a total of 15 Minutes at the the patient's bedside AND on the patient's hospital floor or unit, greater than 50% of which was counseling/coordinating care for right chest tube.

## 2020-09-22 NOTE — Progress Notes (Addendum)
Pulmonary Critical Care Medicine [facility] [place]   PULMONARY CRITICAL CARE SERVICE  PROGRESS NOTE  Date of Service: 09/22/2020  Eric Knapp  VHQ:[id number]  DOB: 20-Feb-1941   DOA: 2020/09/28  Referring Physician: Carron Curie, MD  HPI: Eric Knapp is a 79 y.o. male seen for follow up of Acute on Chronic Respiratory Failure.  Patient is on 20% aerosol trach collar satting well no fever or distress.  Medications: Reviewed on Rounds  Physical Exam:  Vitals: Pulse 86 respirations 31 BP 125/67 O2 sat 95% 97.3  Ventilator Settings 28% ATC  . General: Comfortable at this time . Eyes: Grossly normal lids, irises & conjunctiva . ENT: grossly tongue is normal . Neck: no obvious mass . Cardiovascular: S1 S2 normal no gallop . Respiratory: No rales or rhonchi noted . Abdomen: soft . Skin: no rash seen on limited exam . Musculoskeletal: not rigid . Psychiatric:unable to assess . Neurologic: no seizure no involuntary movements         Lab Data:   Basic Metabolic Panel: Recent Labs  Lab 09/16/20 1142 09/16/20 1142 09/17/20 0456 09/18/20 0828 09/20/20 0438 09/20/20 2245 09/22/20 0604  NA 138  --   --  143 140  --  138  K 5.9*   < > 4.7 5.1 5.2* 4.7 5.1  CL 105  --   --  106 104  --  101  CO2 25  --   --  27 25  --  23  GLUCOSE 159*  --   --  128* 104*  --  119*  BUN 101*  --   --  98* 95*  --  94*  CREATININE 1.77*  --   --  1.79* 1.83*  --  1.90*  CALCIUM 7.9*  --   --  8.1* 8.4*  --  8.1*  PHOS 3.7  --   --   --   --   --   --    < > = values in this interval not displayed.    ABG: Recent Labs  Lab 09/19/20 1905  PHART 7.395  PCO2ART 46.3  PO2ART 73.2*  HCO3 27.8  O2SAT 95.0    Liver Function Tests: Recent Labs  Lab 09/16/20 1142  ALBUMIN 1.7*   No results for input(s): LIPASE, AMYLASE in the last 168 hours. No results for input(s): AMMONIA in the last 168 hours.  CBC: Recent Labs  Lab 09/18/20 1149 09/22/20 0604  WBC 9.9 8.7  HGB  8.3* 7.1*  HCT 28.1* 24.5*  MCV 92.7 95.7  PLT 228 253    Cardiac Enzymes: No results for input(s): CKTOTAL, CKMB, CKMBINDEX, TROPONINI in the last 168 hours.  BNP (last 3 results) Recent Labs    09/04/20 1646 09/05/20 1007  BNP 389.9* 458.9*    ProBNP (last 3 results) No results for input(s): PROBNP in the last 8760 hours.  Radiological Exams: No results found.  Assessment/Plan Active Problems:   Acute on chronic respiratory failure with hypoxia (HCC)   Chronic combined systolic and diastolic heart failure (HCC)   COPD, severe (HCC)   Chronic atrial fibrillation (HCC)   Metabolic encephalopathy   Pleural effusion, right   1. Acute on chronic respiratory failure with hypoxia patient will continue on 20% aerosol trach collar this time continue supportive measures and aggressive pulmonary toilet. 2. Chronic combined systolic diastolic heart failure compensated 3. Severe COPD at baseline 4. Chronic atrial fibrillation rate is controlled 5. Metabolic encephalopathy slow improvement 6. Pleural effusion follow radiologically  I have personally seen and evaluated the patient, evaluated laboratory and imaging results, formulated the assessment and plan and placed orders. The Patient requires high complexity decision making with multiple systems involvement.  Rounds were done with the Respiratory Therapy Director and Staff therapists and discussed with nursing staff also.  Allyne Gee, MD [facility] Pulmonary Critical Care Medicine Sleep Medicine

## 2020-09-23 DIAGNOSIS — J9621 Acute and chronic respiratory failure with hypoxia: Secondary | ICD-10-CM | POA: Diagnosis not present

## 2020-09-23 DIAGNOSIS — I482 Chronic atrial fibrillation, unspecified: Secondary | ICD-10-CM | POA: Diagnosis not present

## 2020-09-23 DIAGNOSIS — I5042 Chronic combined systolic (congestive) and diastolic (congestive) heart failure: Secondary | ICD-10-CM | POA: Diagnosis not present

## 2020-09-23 DIAGNOSIS — J449 Chronic obstructive pulmonary disease, unspecified: Secondary | ICD-10-CM | POA: Diagnosis not present

## 2020-09-23 LAB — RENAL FUNCTION PANEL
Albumin: 1.8 g/dL — ABNORMAL LOW (ref 3.5–5.0)
Anion gap: 14 (ref 5–15)
BUN: 100 mg/dL — ABNORMAL HIGH (ref 8–23)
CO2: 23 mmol/L (ref 22–32)
Calcium: 8.1 mg/dL — ABNORMAL LOW (ref 8.9–10.3)
Chloride: 100 mmol/L (ref 98–111)
Creatinine, Ser: 2.04 mg/dL — ABNORMAL HIGH (ref 0.61–1.24)
GFR calc non Af Amer: 31 mL/min — ABNORMAL LOW (ref >60)
Glucose, Bld: 148 mg/dL — ABNORMAL HIGH (ref 70–99)
Phosphorus: 4.1 mg/dL (ref 2.5–4.6)
Potassium: 5.2 mmol/L — ABNORMAL HIGH (ref 3.5–5.1)
Sodium: 137 mmol/L (ref 135–145)

## 2020-09-23 NOTE — Progress Notes (Addendum)
Pulmonary Critical Care Medicine [facility] [place]   PULMONARY CRITICAL CARE SERVICE  PROGRESS NOTE  Date of Service: 09/23/2020  Depaul Arizpe  TKZ:[id number]  DOB: January 06, 1941   DOA: 2020-09-02  Referring Physician: Carron Curie, MD  HPI: Eric Knapp is a 79 y.o. male seen for follow up of Acute on Chronic Respiratory Failure.  Patient mains on 20% aerosol trach collar satting well no fever distress.  Medications: Reviewed on Rounds  Physical Exam:  Vitals: Pulse 64 respirations 30 BP 122/67 O2 sat 95% temp 98.3  Ventilator Settings not currently on ventilator  . General: Comfortable at this time . Eyes: Grossly normal lids, irises & conjunctiva . ENT: grossly tongue is normal . Neck: no obvious mass . Cardiovascular: S1 S2 normal no gallop . Respiratory: No rales or rhonchi noted . Abdomen: soft . Skin: no rash seen on limited exam . Musculoskeletal: not rigid . Psychiatric:unable to assess . Neurologic: no seizure no involuntary movements         Lab Data:   Basic Metabolic Panel: Recent Labs  Lab 09/16/20 1142 09/16/20 1142 09/17/20 0456 09/18/20 0828 09/20/20 0438 09/20/20 2245 09/22/20 0604  NA 138  --   --  143 140  --  138  K 5.9*   < > 4.7 5.1 5.2* 4.7 5.1  CL 105  --   --  106 104  --  101  CO2 25  --   --  27 25  --  23  GLUCOSE 159*  --   --  128* 104*  --  119*  BUN 101*  --   --  98* 95*  --  94*  CREATININE 1.77*  --   --  1.79* 1.83*  --  1.90*  CALCIUM 7.9*  --   --  8.1* 8.4*  --  8.1*  PHOS 3.7  --   --   --   --   --   --    < > = values in this interval not displayed.    ABG: Recent Labs  Lab 09/19/20 1905  PHART 7.395  PCO2ART 46.3  PO2ART 73.2*  HCO3 27.8  O2SAT 95.0    Liver Function Tests: Recent Labs  Lab 09/16/20 1142  ALBUMIN 1.7*   No results for input(s): LIPASE, AMYLASE in the last 168 hours. No results for input(s): AMMONIA in the last 168 hours.  CBC: Recent Labs  Lab 09/18/20 1149  09/22/20 0604  WBC 9.9 8.7  HGB 8.3* 7.1*  HCT 28.1* 24.5*  MCV 92.7 95.7  PLT 228 253    Cardiac Enzymes: No results for input(s): CKTOTAL, CKMB, CKMBINDEX, TROPONINI in the last 168 hours.  BNP (last 3 results) Recent Labs    09/04/20 1646 09/05/20 1007  BNP 389.9* 458.9*    ProBNP (last 3 results) No results for input(s): PROBNP in the last 8760 hours.  Radiological Exams: DG Chest Port 1 View  Result Date: 09/22/2020 CLINICAL DATA:  Respiratory failure. EXAM: PORTABLE CHEST 1 VIEW COMPARISON:  09/11/2020 FINDINGS: The tracheostomy tube is in good position and unchanged. Stable mild cardiac enlargement and prominent mediastinal and hilar contours. Right-sided basilar chest tube is stable. Small residual right pleural effusion or pleural thickening. Persistent asymmetric interstitial and airspace process in the lungs. No pneumothorax. IMPRESSION: 1. Stable support apparatus. 2. Persistent asymmetric interstitial and airspace process. Electronically Signed   By: Rudie Meyer M.D.   On: 09/22/2020 13:41    Assessment/Plan Active Problems:   Acute  on chronic respiratory failure with hypoxia (HCC)   Chronic combined systolic and diastolic heart failure (HCC)   COPD, severe (HCC)   Chronic atrial fibrillation (HCC)   Metabolic encephalopathy   Pleural effusion, right   1. Acute on chronic respiratory failure with hypoxia patient will continue on 28% aerosol trach collar this time continue supportive measures and aggressive pulmonary toilet. 2. Chronic combined systolic diastolic heart failure compensated 3. Severe COPD at baseline 4. Chronic atrial fibrillation rate is controlled 5. Metabolic encephalopathy slow improvement 6. Pleural effusion follow radiologically   I have personally seen and evaluated the patient, evaluated laboratory and imaging results, formulated the assessment and plan and placed orders. The Patient requires high complexity decision making with  multiple systems involvement.  Rounds were done with the Respiratory Therapy Director and Staff therapists and discussed with nursing staff also.  Yevonne Pax, MD [facility] Pulmonary Critical Care Medicine Sleep Medicine

## 2020-09-24 DIAGNOSIS — J9621 Acute and chronic respiratory failure with hypoxia: Secondary | ICD-10-CM | POA: Diagnosis not present

## 2020-09-24 DIAGNOSIS — I5042 Chronic combined systolic (congestive) and diastolic (congestive) heart failure: Secondary | ICD-10-CM | POA: Diagnosis not present

## 2020-09-24 DIAGNOSIS — J449 Chronic obstructive pulmonary disease, unspecified: Secondary | ICD-10-CM | POA: Diagnosis not present

## 2020-09-24 DIAGNOSIS — I482 Chronic atrial fibrillation, unspecified: Secondary | ICD-10-CM | POA: Diagnosis not present

## 2020-09-24 LAB — BASIC METABOLIC PANEL
Anion gap: 12 (ref 5–15)
BUN: 105 mg/dL — ABNORMAL HIGH (ref 8–23)
CO2: 26 mmol/L (ref 22–32)
Calcium: 8.3 mg/dL — ABNORMAL LOW (ref 8.9–10.3)
Chloride: 102 mmol/L (ref 98–111)
Creatinine, Ser: 2.22 mg/dL — ABNORMAL HIGH (ref 0.61–1.24)
GFR calc non Af Amer: 28 mL/min — ABNORMAL LOW (ref >60)
Glucose, Bld: 119 mg/dL — ABNORMAL HIGH (ref 70–99)
Potassium: 4.4 mmol/L (ref 3.5–5.1)
Sodium: 140 mmol/L (ref 135–145)

## 2020-09-24 LAB — CBC
HCT: 25.8 % — ABNORMAL LOW (ref 39.0–52.0)
Hemoglobin: 7.5 g/dL — ABNORMAL LOW (ref 13.0–17.0)
MCH: 28.4 pg (ref 26.0–34.0)
MCHC: 29.1 g/dL — ABNORMAL LOW (ref 30.0–36.0)
MCV: 97.7 fL (ref 80.0–100.0)
Platelets: 264 10*3/uL (ref 150–400)
RBC: 2.64 MIL/uL — ABNORMAL LOW (ref 4.22–5.81)
RDW: 21.3 % — ABNORMAL HIGH (ref 11.5–15.5)
WBC: 8.9 10*3/uL (ref 4.0–10.5)
nRBC: 0.2 % (ref 0.0–0.2)

## 2020-09-24 NOTE — Progress Notes (Signed)
Pulmonary Critical Care Medicine [facility] [place]   PULMONARY CRITICAL CARE SERVICE  PROGRESS NOTE  Date of Service: 09/24/2020  Eric Knapp  MEQ:[id number]  DOB: 05/24/1941   DOA: 09-16-2020  Referring Physician: Carron Curie, MD  HPI: Eric Knapp is a 79 y.o. male seen for follow up of Acute on Chronic Respiratory Failure.  Patient is on T collar currently on 28% FiO2 secretions are pretty moderate  Medications: Reviewed on Rounds  Physical Exam:  Vitals: Temperature 97.1 pulse 95 respiratory rate 16 blood pressure is 119/65 saturations 94%  Ventilator Settings on T collar with an FiO2 of 28%  . General: Comfortable at this time . Eyes: Grossly normal lids, irises & conjunctiva . ENT: grossly tongue is normal . Neck: no obvious mass . Cardiovascular: S1 S2 normal no gallop . Respiratory: No rhonchi no rales are noted at this time . Abdomen: soft . Skin: no rash seen on limited exam . Musculoskeletal: not rigid . Psychiatric:unable to assess . Neurologic: no seizure no involuntary movements         Lab Data:   Basic Metabolic Panel: Recent Labs  Lab 09/18/20 0828 09/18/20 0828 09/20/20 0438 09/20/20 2245 09/22/20 0604 09/23/20 1013 09/24/20 0546  NA 143  --  140  --  138 137 140  K 5.1   < > 5.2* 4.7 5.1 5.2* 4.4  CL 106  --  104  --  101 100 102  CO2 27  --  25  --  23 23 26   GLUCOSE 128*  --  104*  --  119* 148* 119*  BUN 98*  --  95*  --  94* 100* 105*  CREATININE 1.79*  --  1.83*  --  1.90* 2.04* 2.22*  CALCIUM 8.1*  --  8.4*  --  8.1* 8.1* 8.3*  PHOS  --   --   --   --   --  4.1  --    < > = values in this interval not displayed.    ABG: Recent Labs  Lab 09/19/20 1905  PHART 7.395  PCO2ART 46.3  PO2ART 73.2*  HCO3 27.8  O2SAT 95.0    Liver Function Tests: Recent Labs  Lab 09/23/20 1013  ALBUMIN 1.8*   No results for input(s): LIPASE, AMYLASE in the last 168 hours. No results for input(s): AMMONIA in the last 168  hours.  CBC: Recent Labs  Lab 09/18/20 1149 09/22/20 0604 09/24/20 0546  WBC 9.9 8.7 8.9  HGB 8.3* 7.1* 7.5*  HCT 28.1* 24.5* 25.8*  MCV 92.7 95.7 97.7  PLT 228 253 264    Cardiac Enzymes: No results for input(s): CKTOTAL, CKMB, CKMBINDEX, TROPONINI in the last 168 hours.  BNP (last 3 results) Recent Labs    09/04/20 1646 09/05/20 1007  BNP 389.9* 458.9*    ProBNP (last 3 results) No results for input(s): PROBNP in the last 8760 hours.  Radiological Exams: DG Chest Port 1 View  Result Date: 09/22/2020 CLINICAL DATA:  Respiratory failure. EXAM: PORTABLE CHEST 1 VIEW COMPARISON:  09/11/2020 FINDINGS: The tracheostomy tube is in good position and unchanged. Stable mild cardiac enlargement and prominent mediastinal and hilar contours. Right-sided basilar chest tube is stable. Small residual right pleural effusion or pleural thickening. Persistent asymmetric interstitial and airspace process in the lungs. No pneumothorax. IMPRESSION: 1. Stable support apparatus. 2. Persistent asymmetric interstitial and airspace process. Electronically Signed   By: 09/13/2020 M.D.   On: 09/22/2020 13:41    Assessment/Plan Active  Problems:   Acute on chronic respiratory failure with hypoxia (HCC)   Chronic combined systolic and diastolic heart failure (HCC)   COPD, severe (HCC)   Chronic atrial fibrillation (HCC)   Metabolic encephalopathy   Pleural effusion, right   1. Acute on chronic respiratory failure with hypoxia continue with T collar trials titrate oxygen continue pulmonary toilet. 2. Chronic combined systolic diastolic heart failure appears to be compensated 3. Severe COPD medical management 4. Chronic atrial fibrillation rate is controlled 5. Metabolic encephalopathy slow improvement 6. Pleural effusion chest x-ray shows interstitial airspace disease   I have personally seen and evaluated the patient, evaluated laboratory and imaging results, formulated the assessment and  plan and placed orders. The Patient requires high complexity decision making with multiple systems involvement.  Rounds were done with the Respiratory Therapy Director and Staff therapists and discussed with nursing staff also.  Yevonne Pax, MD [facility] Pulmonary Critical Care Medicine Sleep Medicine

## 2020-09-25 ENCOUNTER — Other Ambulatory Visit (HOSPITAL_COMMUNITY): Payer: Medicare Other

## 2020-09-25 DIAGNOSIS — J449 Chronic obstructive pulmonary disease, unspecified: Secondary | ICD-10-CM | POA: Diagnosis not present

## 2020-09-25 DIAGNOSIS — I5042 Chronic combined systolic (congestive) and diastolic (congestive) heart failure: Secondary | ICD-10-CM | POA: Diagnosis not present

## 2020-09-25 DIAGNOSIS — I482 Chronic atrial fibrillation, unspecified: Secondary | ICD-10-CM | POA: Diagnosis not present

## 2020-09-25 DIAGNOSIS — J9621 Acute and chronic respiratory failure with hypoxia: Secondary | ICD-10-CM | POA: Diagnosis not present

## 2020-09-25 LAB — BLOOD GAS, ARTERIAL
Acid-Base Excess: 4.5 mmol/L — ABNORMAL HIGH (ref 0.0–2.0)
Acid-Base Excess: 4.8 mmol/L — ABNORMAL HIGH (ref 0.0–2.0)
Bicarbonate: 30.7 mmol/L — ABNORMAL HIGH (ref 20.0–28.0)
Bicarbonate: 29.6 mmol/L — ABNORMAL HIGH (ref 20.0–28.0)
FIO2: 28
FIO2: 50
O2 Saturation: 62.5 %
O2 Saturation: 99.8 %
Patient temperature: 36.4
Patient temperature: 37
pCO2 arterial: 63.8 mmHg — ABNORMAL HIGH (ref 32.0–48.0)
pCO2 arterial: 51.2 mmHg — ABNORMAL HIGH (ref 32.0–48.0)
pH, Arterial: 7.299 — ABNORMAL LOW (ref 7.350–7.450)
pH, Arterial: 7.38 (ref 7.350–7.450)
pO2, Arterial: 34.6 mmHg — CL (ref 83.0–108.0)
pO2, Arterial: 148 mmHg — ABNORMAL HIGH (ref 83.0–108.0)

## 2020-09-25 NOTE — Progress Notes (Signed)
Referring Physician(s): Dr. Manson Passey [facility])  Supervising Physician: Simonne Come  Patient Status:  [facility] - In-pt  Chief Complaint: Right loculated pleural effusion/empyema s/p right chest tube placement in IR 09/07/2020.  Subjective: Patient laying in bed, eyes open, tracheostomy in place. Does not follow simple commands. Right chest tube site unremarkable. No family at bedside.     Vital Signs: Temp: 96.2; HR 95, BP 107/49, RR 26, 94% on trach collar   Physical Exam Constitutional:      General: He is not in acute distress. Pulmonary:     Effort: Pulmonary effort is normal.     Comments: Tracheostomy/trach collar. Right chest tube with serosanguineous fluid in Pleur evac About 47mL past 24 hr recorded. Dressing is loose with some drainage. Skin insertion site is without erythema or tenderness. Sutures intact.  Abdominal:     Comments: RUQ drain intact with scant amount of brown drainage in collection container.   Skin:    General: Skin is warm and dry.  Neurological:     Mental Status: He is alert.     Comments: Unable to assess.      Imaging: No results found.  Labs:  CBC: Recent Labs    09/10/20 1001 09/14/20 0936 09/18/20 1149 09/22/20 0604  WBC 6.8 8.0 9.9 8.7  HGB 8.4* 8.1* 8.3* 7.1*  HCT 28.5* 27.3* 28.1* 24.5*  PLT 183 187 228 253    COAGS: Recent Labs    09/01/20 0955  INR 1.7*    BMP: Recent Labs    09/16/20 1142 09/17/20 0456 09/18/20 0828 09/20/20 0438 09/20/20 2245 09/22/20 0604  NA 138  --  143 140  --  138  K 5.9*   < > 5.1 5.2* 4.7 5.1  CL 105  --  106 104  --  101  CO2 25  --  27 25  --  23  GLUCOSE 159*  --  128* 104*  --  119*  BUN 101*  --  98* 95*  --  94*  CALCIUM 7.9*  --  8.1* 8.4*  --  8.1*  CREATININE 1.77*  --  1.79* 1.83*  --  1.90*  GFRNONAA 36*  --  36* 35*  --  33*  GFRAA 42*  --  42* 41*  --  39*   < > = values in this interval not displayed.    LIVER FUNCTION TESTS: Recent Labs     09/01/20 0955 09/01/20 0955 09/08/20 0914 09/09/20 1947 09/14/20 0936 09/16/20 1142  BILITOT 1.1  --   --   --   --   --   AST 45*  --   --   --   --   --   ALT 44  --   --   --   --   --   ALKPHOS 106  --   --   --   --   --   PROT 6.4*  --   --   --   --   --   ALBUMIN 2.1*   < > 1.9* 1.8* 1.7* 1.7*   < > = values in this interval not displayed.    Assessment and Plan:  Right loculated pleural effusion/empyema s/p right chest tube placement in IR 09/07/2020: Right chest tube is stable.   CXR cont to show small layering effusion Continue current tube management- tube to remain to suction at this time. Further plans per [facility]- appreciate and agree with management.  Electronically Signed: Alwyn Ren, AGACNP-BC [phone] 09/25/2020, 12:15 PM   I spent a total of 15 Minutes at the the patient's bedside AND on the patient's hospital floor or unit, greater than 50% of which was counseling/coordinating care for right chest tube.

## 2020-09-25 NOTE — Progress Notes (Addendum)
Pulmonary Critical Care Medicine [facility] [place]   PULMONARY CRITICAL CARE SERVICE  PROGRESS NOTE  Date of Service: 09/25/2020  Eric Knapp  QMV:[id number]  DOB: 02/08/1941   DOA: 2020-09-10  Referring Physician: Carron Curie, MD  HPI: Eric Knapp is a 79 y.o. male seen for follow up of Acute on Chronic Respiratory Failure.  Patient continues on 28% T-bar at this time using PMV satting well no distress.  Medications: Reviewed on Rounds  Physical Exam:  Vitals: Pulse 84 respirations 24 BP 106/60 O2 sat 95% temp 97.0  Ventilator Settings not currently on ventilator   General: Comfortable at this time  Eyes: Grossly normal lids, irises & conjunctiva  ENT: grossly tongue is normal  Neck: no obvious mass  Cardiovascular: S1 S2 normal no gallop  Respiratory: No rales or rhonchi noted  Abdomen: soft  Skin: no rash seen on limited exam  Musculoskeletal: not rigid  Psychiatric:unable to assess  Neurologic: no seizure no involuntary movements         Lab Data:   Basic Metabolic Panel: Recent Labs  Lab 09/20/20 0438 09/20/20 2245 09/22/20 0604 09/23/20 1013 09/24/20 0546  NA 140  --  138 137 140  K 5.2* 4.7 5.1 5.2* 4.4  CL 104  --  101 100 102  CO2 25  --  23 23 26   GLUCOSE 104*  --  119* 148* 119*  BUN 95*  --  94* 100* 105*  CREATININE 1.83*  --  1.90* 2.04* 2.22*  CALCIUM 8.4*  --  8.1* 8.1* 8.3*  PHOS  --   --   --  4.1  --     ABG: Recent Labs  Lab 09/19/20 1905  PHART 7.395  PCO2ART 46.3  PO2ART 73.2*  HCO3 27.8  O2SAT 95.0    Liver Function Tests: Recent Labs  Lab 09/23/20 1013  ALBUMIN 1.8*   No results for input(s): LIPASE, AMYLASE in the last 168 hours. No results for input(s): AMMONIA in the last 168 hours.  CBC: Recent Labs  Lab 09/22/20 0604 09/24/20 0546  WBC 8.7 8.9  HGB 7.1* 7.5*  HCT 24.5* 25.8*  MCV 95.7 97.7  PLT 253 264    Cardiac Enzymes: No results for input(s): CKTOTAL, CKMB, CKMBINDEX,  TROPONINI in the last 168 hours.  BNP (last 3 results) Recent Labs    09/04/20 1646 09/05/20 1007  BNP 389.9* 458.9*    ProBNP (last 3 results) No results for input(s): PROBNP in the last 8760 hours.  Radiological Exams: CT ABDOMEN PELVIS WO CONTRAST  Result Date: 09/25/2020 CLINICAL DATA:  Pleural effusion suspected EXAM: CT CHEST, ABDOMEN AND PELVIS WITHOUT CONTRAST TECHNIQUE: Multidetector CT imaging of the chest, abdomen and pelvis was performed following the standard protocol without IV contrast. COMPARISON:  09/05/2020 FINDINGS: CT CHEST FINDINGS Cardiovascular: Normal heart size. Low-density blood pool. Extensive coronary and aortic atherosclerotic calcification. Mediastinum/Nodes: No pneumomediastinum or worrisome lymph nodes. Lungs/Pleura: Moderate layering pleural effusion on both sides, diminished on the right and new on the left. Dependent atelectasis. There is superimposed pleural thickening on the right. Mild inflammatory appearing patchy pulmonary opacity. There is a right-sided pleural catheter in place the lateral costophrenic sulcus. Musculoskeletal: Body wall edema. Healing bilateral rib fractures with callus. 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 Spondylosis with multi-level bridging osteophyte. CT ABDOMEN PELVIS FINDINGS Hepatobiliary: No focal liver abnormality.Percutaneous cholecystostomy tube with decompressed gallbladder which likely contains calculi. Plastic biliary stent is in expected position. Pneumobilia. Pancreas: Generalized atrophy. Spleen: Negative Adrenals/Urinary Tract:  Negative adrenals. No hydronephrosis or stone. Symmetric renal atrophy. Unremarkable bladder. Stomach/Bowel: No obstruction. No visible bowel inflammation. Percutaneous gastrostomy tube in expected position. Vascular/Lymphatic: Diffuse atherosclerotic calcification. No mass or adenopathy. Reproductive:Negative Other: Retroperitoneal and body wall edema. Trace ascites below the right lobe  liver, stable. Musculoskeletal: No acute abnormalities. Advanced disc and facet degeneration. Generalized osteopenia. IMPRESSION: 1. Moderate bilateral pleural effusion with pleural thickening on the right. The right effusion is decreased from 09/05/2020 in the left effusion is new. Right pleural catheter in place. 2. Multifocal atelectasis. 3. Stable percutaneous cholecystostomy tube positioning with decompressed gallbladder. Trace perihepatic ascites that is stable. 4. Anasarca Electronically Signed   By: Marnee Spring M.D.   On: 09/25/2020 05:34   CT CHEST WO CONTRAST  Result Date: 09/25/2020 CLINICAL DATA:  Pleural effusion suspected EXAM: CT CHEST, ABDOMEN AND PELVIS WITHOUT CONTRAST TECHNIQUE: Multidetector CT imaging of the chest, abdomen and pelvis was performed following the standard protocol without IV contrast. COMPARISON:  09/05/2020 FINDINGS: CT CHEST FINDINGS Cardiovascular: Normal heart size. Low-density blood pool. Extensive coronary and aortic atherosclerotic calcification. Mediastinum/Nodes: No pneumomediastinum or worrisome lymph nodes. Lungs/Pleura: Moderate layering pleural effusion on both sides, diminished on the right and new on the left. Dependent atelectasis. There is superimposed pleural thickening on the right. Mild inflammatory appearing patchy pulmonary opacity. There is a right-sided pleural catheter in place the lateral costophrenic sulcus. Musculoskeletal: Body wall edema. Healing bilateral rib fractures with callus. 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 Spondylosis with multi-level bridging osteophyte. CT ABDOMEN PELVIS FINDINGS Hepatobiliary: No focal liver abnormality.Percutaneous cholecystostomy tube with decompressed gallbladder which likely contains calculi. Plastic biliary stent is in expected position. Pneumobilia. Pancreas: Generalized atrophy. Spleen: Negative Adrenals/Urinary Tract: Negative adrenals. No hydronephrosis or stone. Symmetric renal atrophy.  Unremarkable bladder. Stomach/Bowel: No obstruction. No visible bowel inflammation. Percutaneous gastrostomy tube in expected position. Vascular/Lymphatic: Diffuse atherosclerotic calcification. No mass or adenopathy. Reproductive:Negative Other: Retroperitoneal and body wall edema. Trace ascites below the right lobe liver, stable. Musculoskeletal: No acute abnormalities. Advanced disc and facet degeneration. Generalized osteopenia. IMPRESSION: 1. Moderate bilateral pleural effusion with pleural thickening on the right. The right effusion is decreased from 09/05/2020 in the left effusion is new. Right pleural catheter in place. 2. Multifocal atelectasis. 3. Stable percutaneous cholecystostomy tube positioning with decompressed gallbladder. Trace perihepatic ascites that is stable. 4. Anasarca Electronically Signed   By: Marnee Spring M.D.   On: 09/25/2020 05:34   DG Chest Port 1 View  Result Date: 09/25/2020 CLINICAL DATA:  Pleural effusion EXAM: PORTABLE CHEST 1 VIEW COMPARISON:  CT earlier today FINDINGS: Layering right pleural effusion with pleural catheter in place. There is also a layering left pleural effusion, better assessed by CT. Atelectatic type opacity and vascular congestion. Cardiomegaly. Tracheostomy tube in good position. IMPRESSION: Bilateral pleural effusion and multi segment atelectasis as seen on CT performed at the same time. Electronically Signed   By: Marnee Spring M.D.   On: 09/25/2020 05:50    Assessment/Plan Active Problems:   Acute on chronic respiratory failure with hypoxia (HCC)   Chronic combined systolic and diastolic heart failure (HCC)   COPD, severe (HCC)   Chronic atrial fibrillation (HCC)   Metabolic encephalopathy   Pleural effusion, right   1. Acute on chronic respiratory failure with hypoxia patient continues on 20% T-bar at this time using PMV with no difficulty.  We will continue aggressive pulmonary toilet supportive measures. 2. Chronic combined  systolic diastolic heart failure appears to be compensated 3. Severe COPD medical  management 4. Chronic atrial fibrillation rate is controlled 5. Metabolic encephalopathy slow improvement 6. Pleural effusion chest x-ray shows interstitial airspace disease   I have personally seen and evaluated the patient, evaluated laboratory and imaging results, formulated the assessment and plan and placed orders. The Patient requires high complexity decision making with multiple systems involvement.  Rounds were done with the Respiratory Therapy Director and Staff therapists and discussed with nursing staff also.  Yevonne Pax, MD [facility] Pulmonary Critical Care Medicine Sleep Medicine

## 2020-09-26 DIAGNOSIS — I482 Chronic atrial fibrillation, unspecified: Secondary | ICD-10-CM | POA: Diagnosis not present

## 2020-09-26 DIAGNOSIS — J9621 Acute and chronic respiratory failure with hypoxia: Secondary | ICD-10-CM | POA: Diagnosis not present

## 2020-09-26 DIAGNOSIS — J449 Chronic obstructive pulmonary disease, unspecified: Secondary | ICD-10-CM | POA: Diagnosis not present

## 2020-09-26 DIAGNOSIS — I5042 Chronic combined systolic (congestive) and diastolic (congestive) heart failure: Secondary | ICD-10-CM | POA: Diagnosis not present

## 2020-09-26 LAB — CBC
HCT: 27.5 % — ABNORMAL LOW (ref 39.0–52.0)
Hemoglobin: 7.9 g/dL — ABNORMAL LOW (ref 13.0–17.0)
MCH: 28.8 pg (ref 26.0–34.0)
MCHC: 28.7 g/dL — ABNORMAL LOW (ref 30.0–36.0)
MCV: 100.4 fL — ABNORMAL HIGH (ref 80.0–100.0)
Platelets: 233 10*3/uL (ref 150–400)
RBC: 2.74 MIL/uL — ABNORMAL LOW (ref 4.22–5.81)
RDW: 23.8 % — ABNORMAL HIGH (ref 11.5–15.5)
WBC: 8.9 10*3/uL (ref 4.0–10.5)
nRBC: 0 % (ref 0.0–0.2)

## 2020-09-26 LAB — BASIC METABOLIC PANEL
Anion gap: 11 (ref 5–15)
BUN: 117 mg/dL — ABNORMAL HIGH (ref 8–23)
CO2: 30 mmol/L (ref 22–32)
Calcium: 8.6 mg/dL — ABNORMAL LOW (ref 8.9–10.3)
Chloride: 103 mmol/L (ref 98–111)
Creatinine, Ser: 2.07 mg/dL — ABNORMAL HIGH (ref 0.61–1.24)
GFR, Estimated: 30 mL/min — ABNORMAL LOW (ref >60)
Glucose, Bld: 108 mg/dL — ABNORMAL HIGH (ref 70–99)
Potassium: 4.4 mmol/L (ref 3.5–5.1)
Sodium: 144 mmol/L (ref 135–145)

## 2020-09-26 NOTE — Progress Notes (Signed)
Pulmonary Critical Care Medicine [facility] [place]   PULMONARY CRITICAL CARE SERVICE  PROGRESS NOTE  Date of Service: 09/26/2020  Bilal Knapp  GBT:[id number]  DOB: 04-22-1941   DOA: 2020/09/23  Referring Physician: Carron Curie, MD  HPI: Eric Knapp is a 79 y.o. male seen for follow up of Acute on Chronic Respiratory Failure.  Patient currently is on pressure support noted to have a right-sided pleural effusion and left-sided pleural effusion on CT  Medications: Reviewed on Rounds  Physical Exam:  Vitals: Temperature is 96.0 pulse 75 respiratory 18 blood pressure is 106/66 saturations 99%  Ventilator Settings on pressure support 12/5  . General: Comfortable at this time . Eyes: Grossly normal lids, irises & conjunctiva . ENT: grossly tongue is normal . Neck: no obvious mass . Cardiovascular: S1 S2 normal no gallop . Respiratory: No rhonchi no rales are noted at this time . Abdomen: soft . Skin: no rash seen on limited exam . Musculoskeletal: not rigid . Psychiatric:unable to assess . Neurologic: no seizure no involuntary movements         Lab Data:   Basic Metabolic Panel: Recent Labs  Lab 09/20/20 0438 09/20/20 2245 09/22/20 0604 09/23/20 1013 09/24/20 0546  NA 140  --  138 137 140  K 5.2* 4.7 5.1 5.2* 4.4  CL 104  --  101 100 102  CO2 25  --  23 23 26   GLUCOSE 104*  --  119* 148* 119*  BUN 95*  --  94* 100* 105*  CREATININE 1.83*  --  1.90* 2.04* 2.22*  CALCIUM 8.4*  --  8.1* 8.1* 8.3*  PHOS  --   --   --  4.1  --     ABG: Recent Labs  Lab 09/19/20 1905 09/25/20 1750 09/25/20 2127  PHART 7.395 7.299* 7.380  PCO2ART 46.3 63.8* 51.2*  PO2ART 73.2* 34.6* 148*  HCO3 27.8 30.7* 29.6*  O2SAT 95.0 62.5 99.8    Liver Function Tests: Recent Labs  Lab 09/23/20 1013  ALBUMIN 1.8*   No results for input(s): LIPASE, AMYLASE in the last 168 hours. No results for input(s): AMMONIA in the last 168 hours.  CBC: Recent Labs  Lab  09/22/20 0604 09/24/20 0546  WBC 8.7 8.9  HGB 7.1* 7.5*  HCT 24.5* 25.8*  MCV 95.7 97.7  PLT 253 264    Cardiac Enzymes: No results for input(s): CKTOTAL, CKMB, CKMBINDEX, TROPONINI in the last 168 hours.  BNP (last 3 results) Recent Labs    09/04/20 1646 09/05/20 1007  BNP 389.9* 458.9*    ProBNP (last 3 results) No results for input(s): PROBNP in the last 8760 hours.  Radiological Exams: CT ABDOMEN PELVIS WO CONTRAST  Result Date: 09/25/2020 CLINICAL DATA:  Pleural effusion suspected EXAM: CT CHEST, ABDOMEN AND PELVIS WITHOUT CONTRAST TECHNIQUE: Multidetector CT imaging of the chest, abdomen and pelvis was performed following the standard protocol without IV contrast. COMPARISON:  09/05/2020 FINDINGS: CT CHEST FINDINGS Cardiovascular: Normal heart size. Low-density blood pool. Extensive coronary and aortic atherosclerotic calcification. Mediastinum/Nodes: No pneumomediastinum or worrisome lymph nodes. Lungs/Pleura: Moderate layering pleural effusion on both sides, diminished on the right and new on the left. Dependent atelectasis. There is superimposed pleural thickening on the right. Mild inflammatory appearing patchy pulmonary opacity. There is a right-sided pleural catheter in place the lateral costophrenic sulcus. Musculoskeletal: Body wall edema. Healing bilateral rib fractures with callus. 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 Spondylosis with multi-level bridging osteophyte. CT ABDOMEN PELVIS FINDINGS Hepatobiliary: No focal liver  abnormality.Percutaneous cholecystostomy tube with decompressed gallbladder which likely contains calculi. Plastic biliary stent is in expected position. Pneumobilia. Pancreas: Generalized atrophy. Spleen: Negative Adrenals/Urinary Tract: Negative adrenals. No hydronephrosis or stone. Symmetric renal atrophy. Unremarkable bladder. Stomach/Bowel: No obstruction. No visible bowel inflammation. Percutaneous gastrostomy tube in expected position.  Vascular/Lymphatic: Diffuse atherosclerotic calcification. No mass or adenopathy. Reproductive:Negative Other: Retroperitoneal and body wall edema. Trace ascites below the right lobe liver, stable. Musculoskeletal: No acute abnormalities. Advanced disc and facet degeneration. Generalized osteopenia. IMPRESSION: 1. Moderate bilateral pleural effusion with pleural thickening on the right. The right effusion is decreased from 09/05/2020 in the left effusion is new. Right pleural catheter in place. 2. Multifocal atelectasis. 3. Stable percutaneous cholecystostomy tube positioning with decompressed gallbladder. Trace perihepatic ascites that is stable. 4. Anasarca Electronically Signed   By: Marnee Spring M.D.   On: 09/25/2020 05:34   CT CHEST WO CONTRAST  Result Date: 09/25/2020 CLINICAL DATA:  Pleural effusion suspected EXAM: CT CHEST, ABDOMEN AND PELVIS WITHOUT CONTRAST TECHNIQUE: Multidetector CT imaging of the chest, abdomen and pelvis was performed following the standard protocol without IV contrast. COMPARISON:  09/05/2020 FINDINGS: CT CHEST FINDINGS Cardiovascular: Normal heart size. Low-density blood pool. Extensive coronary and aortic atherosclerotic calcification. Mediastinum/Nodes: No pneumomediastinum or worrisome lymph nodes. Lungs/Pleura: Moderate layering pleural effusion on both sides, diminished on the right and new on the left. Dependent atelectasis. There is superimposed pleural thickening on the right. Mild inflammatory appearing patchy pulmonary opacity. There is a right-sided pleural catheter in place the lateral costophrenic sulcus. Musculoskeletal: Body wall edema. Healing bilateral rib fractures with callus. 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 Spondylosis with multi-level bridging osteophyte. CT ABDOMEN PELVIS FINDINGS Hepatobiliary: No focal liver abnormality.Percutaneous cholecystostomy tube with decompressed gallbladder which likely contains calculi. Plastic biliary stent  is in expected position. Pneumobilia. Pancreas: Generalized atrophy. Spleen: Negative Adrenals/Urinary Tract: Negative adrenals. No hydronephrosis or stone. Symmetric renal atrophy. Unremarkable bladder. Stomach/Bowel: No obstruction. No visible bowel inflammation. Percutaneous gastrostomy tube in expected position. Vascular/Lymphatic: Diffuse atherosclerotic calcification. No mass or adenopathy. Reproductive:Negative Other: Retroperitoneal and body wall edema. Trace ascites below the right lobe liver, stable. Musculoskeletal: No acute abnormalities. Advanced disc and facet degeneration. Generalized osteopenia. IMPRESSION: 1. Moderate bilateral pleural effusion with pleural thickening on the right. The right effusion is decreased from 09/05/2020 in the left effusion is new. Right pleural catheter in place. 2. Multifocal atelectasis. 3. Stable percutaneous cholecystostomy tube positioning with decompressed gallbladder. Trace perihepatic ascites that is stable. 4. Anasarca Electronically Signed   By: Marnee Spring M.D.   On: 09/25/2020 05:34   DG Chest Port 1 View  Result Date: 09/25/2020 CLINICAL DATA:  Pleural effusion EXAM: PORTABLE CHEST 1 VIEW COMPARISON:  CT earlier today FINDINGS: Layering right pleural effusion with pleural catheter in place. There is also a layering left pleural effusion, better assessed by CT. Atelectatic type opacity and vascular congestion. Cardiomegaly. Tracheostomy tube in good position. IMPRESSION: Bilateral pleural effusion and multi segment atelectasis as seen on CT performed at the same time. Electronically Signed   By: Marnee Spring M.D.   On: 09/25/2020 05:50    Assessment/Plan Active Problems:   Acute on chronic respiratory failure with hypoxia (HCC)   Chronic combined systolic and diastolic heart failure (HCC)   COPD, severe (HCC)   Chronic atrial fibrillation (HCC)   Metabolic encephalopathy   Pleural effusion, right   1. Acute on chronic respiratory failure  hypoxia continue with the weaning on pressure support.  May actually benefit from thoracentesis.  2. Chronic combined systolic diastolic heart failure compensated we will continue to follow 3. Severe COPD adequate management at baseline. 4. Chronic atrial fibrillation rate controlled 5. Metabolic encephalopathy no change 6. Pleural effusion at baseline we will continue with supportive care   I have personally seen and evaluated the patient, evaluated laboratory and imaging results, formulated the assessment and plan and placed orders. The Patient requires high complexity decision making with multiple systems involvement.  Rounds were done with the Respiratory Therapy Director and Staff therapists and discussed with nursing staff also.  Yevonne Pax, MD [facility] Pulmonary Critical Care Medicine Sleep Medicine

## 2020-09-27 DIAGNOSIS — J9621 Acute and chronic respiratory failure with hypoxia: Secondary | ICD-10-CM | POA: Diagnosis not present

## 2020-09-27 DIAGNOSIS — I5042 Chronic combined systolic (congestive) and diastolic (congestive) heart failure: Secondary | ICD-10-CM | POA: Diagnosis not present

## 2020-09-27 DIAGNOSIS — I482 Chronic atrial fibrillation, unspecified: Secondary | ICD-10-CM | POA: Diagnosis not present

## 2020-09-27 DIAGNOSIS — J449 Chronic obstructive pulmonary disease, unspecified: Secondary | ICD-10-CM | POA: Diagnosis not present

## 2020-09-27 NOTE — Progress Notes (Signed)
Pulmonary Critical Care Medicine [facility] [place]   PULMONARY CRITICAL CARE SERVICE  PROGRESS NOTE  Date of Service: 09/27/2020  Eric Knapp  DGU:[id number]  DOB: [birth year]   DOA: 09/14/2020  Referring Physician: Carron Curie, MD  HPI: Eric Knapp is a 79 y.o. male seen for follow up of Acute on Chronic Respiratory Failure.  Patient is on pressure support currently on 28% FiO2 pressure 12/5  Medications: Reviewed on Rounds  Physical Exam:  Vitals: Temperature is 96.2 pulse 84 respiratory rate 17 blood pressure is 122/58 saturations 100%  Ventilator Settings on pressure support FiO2 is 28% pressure 12/5  . General: Comfortable at this time . Eyes: Grossly normal lids, irises & conjunctiva . ENT: grossly tongue is normal . Neck: no obvious mass . Cardiovascular: S1 S2 normal no gallop . Respiratory: No rhonchi very coarse breath sounds . Abdomen: soft . Skin: no rash seen on limited exam . Musculoskeletal: not rigid . Psychiatric:unable to assess . Neurologic: no seizure no involuntary movements         Lab Data:   Basic Metabolic Panel: Recent Labs  Lab 09/20/20 2245 09/22/20 0604 09/23/20 1013 09/24/20 0546 09/26/20 1211  NA  --  138 137 140 144  K 4.7 5.1 5.2* 4.4 4.4  CL  --  101 100 102 103  CO2  --  23 23 26 30   GLUCOSE  --  119* 148* 119* 108*  BUN  --  94* 100* 105* 117*  CREATININE  --  1.90* 2.04* 2.22* 2.07*  CALCIUM  --  8.1* 8.1* 8.3* 8.6*  PHOS  --   --  4.1  --   --     ABG: Recent Labs  Lab 09/25/20 1750 09/25/20 2127  PHART 7.299* 7.380  PCO2ART 63.8* 51.2*  PO2ART 34.6* 148*  HCO3 30.7* 29.6*  O2SAT 62.5 99.8    Liver Function Tests: Recent Labs  Lab 09/23/20 1013  ALBUMIN 1.8*   No results for input(s): LIPASE, AMYLASE in the last 168 hours. No results for input(s): AMMONIA in the last 168 hours.  CBC: Recent Labs  Lab 09/22/20 0604 09/24/20 0546 09/26/20 1211  WBC 8.7 8.9 8.9  HGB 7.1* 7.5* 7.9*  HCT  24.5* 25.8* 27.5*  MCV 95.7 97.7 100.4*  PLT 253 264 233    Cardiac Enzymes: No results for input(s): CKTOTAL, CKMB, CKMBINDEX, TROPONINI in the last 168 hours.  BNP (last 3 results) Recent Labs    09/04/20 1646 09/05/20 1007  BNP 389.9* 458.9*    ProBNP (last 3 results) No results for input(s): PROBNP in the last 8760 hours.  Radiological Exams: No results found.  Assessment/Plan Active Problems:   Acute on chronic respiratory failure with hypoxia (HCC)   Chronic combined systolic and diastolic heart failure (HCC)   COPD, severe (HCC)   Chronic atrial fibrillation (HCC)   Metabolic encephalopathy   Pleural effusion, right   1. Acute on chronic respiratory failure hypoxia we will continue with pressure support titrate oxygen continue pulmonary toilet. 2. Chronic combined systolic and diastolic heart failure we will continue to follow 3. Severe COPD at baseline 4. Chronic atrial fibrillation rate is controlled 5. Metabolic encephalopathy no change we will follow 6. Pleural effusion at baseline   I have personally seen and evaluated the patient, evaluated laboratory and imaging results, formulated the assessment and plan and placed orders. The Patient requires high complexity decision making with multiple systems involvement.  Rounds were done with the Respiratory Therapy Director and  Staff therapists and discussed with nursing staff also.  Allyne Gee, MD [facility] Pulmonary Critical Care Medicine Sleep Medicine

## 2020-09-28 DIAGNOSIS — J9621 Acute and chronic respiratory failure with hypoxia: Secondary | ICD-10-CM | POA: Diagnosis not present

## 2020-09-28 DIAGNOSIS — J449 Chronic obstructive pulmonary disease, unspecified: Secondary | ICD-10-CM | POA: Diagnosis not present

## 2020-09-28 DIAGNOSIS — I482 Chronic atrial fibrillation, unspecified: Secondary | ICD-10-CM | POA: Diagnosis not present

## 2020-09-28 DIAGNOSIS — I5042 Chronic combined systolic (congestive) and diastolic (congestive) heart failure: Secondary | ICD-10-CM | POA: Diagnosis not present

## 2020-09-28 LAB — BASIC METABOLIC PANEL
Anion gap: 9 (ref 5–15)
BUN: 115 mg/dL — ABNORMAL HIGH (ref 8–23)
CO2: 30 mmol/L (ref 22–32)
Calcium: 8.6 mg/dL — ABNORMAL LOW (ref 8.9–10.3)
Chloride: 104 mmol/L (ref 98–111)
Creatinine, Ser: 1.82 mg/dL — ABNORMAL HIGH (ref 0.61–1.24)
GFR, Estimated: 35 mL/min — ABNORMAL LOW (ref >60)
Glucose, Bld: 62 mg/dL — ABNORMAL LOW (ref 70–99)
Potassium: 4.9 mmol/L (ref 3.5–5.1)
Sodium: 143 mmol/L (ref 135–145)

## 2020-09-28 NOTE — Progress Notes (Signed)
Pulmonary Critical Care Medicine [name]   PULMONARY CRITICAL CARE SERVICE  PROGRESS NOTE  Date of Service: 09/28/2020  Eric Knapp  DQQ:[id number]  DOB: 12/19/1941   DOA: Sep 12, 2020  Referring Physician: Carron Curie, MD  HPI: Eric Knapp is a 79 y.o. male seen for follow up of Acute on Chronic Respiratory Failure.  Patient remains on pressure support currently on 28% FiO2 has been on a pressure of 12/5  Medications: Reviewed on Rounds  Physical Exam:  Vitals: Temperature 96.2 pulse 88 respiratory rate 16 blood pressure is 120/48 saturations 99%  Ventilator Settings on pressure support FiO2 28% pressure 12/5  . General: Comfortable at this time . Eyes: Grossly normal lids, irises & conjunctiva . ENT: grossly tongue is normal . Neck: no obvious mass . Cardiovascular: S1 S2 normal no gallop . Respiratory: No rhonchi very coarse breath sounds . Abdomen: soft . Skin: no rash seen on limited exam . Musculoskeletal: not rigid . Psychiatric:unable to assess . Neurologic: no seizure no involuntary movements         Lab Data:   Basic Metabolic Panel: Recent Labs  Lab 09/22/20 0604 09/23/20 1013 09/24/20 0546 09/26/20 1211  NA 138 137 140 144  K 5.1 5.2* 4.4 4.4  CL 101 100 102 103  CO2 23 23 26 30   GLUCOSE 119* 148* 119* 108*  BUN 94* 100* 105* 117*  CREATININE 1.90* 2.04* 2.22* 2.07*  CALCIUM 8.1* 8.1* 8.3* 8.6*  PHOS  --  4.1  --   --     ABG: Recent Labs  Lab 09/25/20 1750 09/25/20 2127  PHART 7.299* 7.380  PCO2ART 63.8* 51.2*  PO2ART 34.6* 148*  HCO3 30.7* 29.6*  O2SAT 62.5 99.8    Liver Function Tests: Recent Labs  Lab 09/23/20 1013  ALBUMIN 1.8*   No results for input(s): LIPASE, AMYLASE in the last 168 hours. No results for input(s): AMMONIA in the last 168 hours.  CBC: Recent Labs  Lab 09/22/20 0604 09/24/20 0546 09/26/20 1211  WBC 8.7 8.9 8.9  HGB 7.1* 7.5* 7.9*  HCT 24.5* 25.8* 27.5*  MCV 95.7 97.7 100.4*   PLT 253 264 233    Cardiac Enzymes: No results for input(s): CKTOTAL, CKMB, CKMBINDEX, TROPONINI in the last 168 hours.  BNP (last 3 results) Recent Labs    09/04/20 1646 09/05/20 1007  BNP 389.9* 458.9*    ProBNP (last 3 results) No results for input(s): PROBNP in the last 8760 hours.  Radiological Exams: No results found.  Assessment/Plan Active Problems:   Acute on chronic respiratory failure with hypoxia (HCC)   Chronic combined systolic and diastolic heart failure (HCC)   COPD, severe (HCC)   Chronic atrial fibrillation (HCC)   Metabolic encephalopathy   Pleural effusion, right   1. Acute on chronic respiratory failure with hypoxia we will continue with the pressure support wean as ordered. 2. Chronic combined systolic diastolic heart failure monitor fluid status closely patient is being seen by nephrology. 3. Severe COPD medical management 4. Chronic atrial fibrillation rate controlled 5. Metabolic encephalopathy supportive care at baseline 6. Pleural effusion follow-up x-rays as needed   I have personally seen and evaluated the patient, evaluated laboratory and imaging results, formulated the assessment and plan and placed orders. The Patient requires high complexity decision making with multiple systems involvement.  Rounds were done with the Respiratory Therapy Director and Staff therapists and discussed with nursing staff also.  09/07/20, MD [facility] Pulmonary Critical Care Medicine Sleep Medicine

## 2020-09-28 NOTE — Progress Notes (Signed)
[facility]  ROUNDING NOTE   Subjective:  Called to see patient back in follow-up. Appears to have worsening azotemia again with BUN of 117 and creatinine of 2.07. Appears to be back on Nepro. The increased protein load appears to cause rises in BUN.  Objective:  Vital signs in last 24 hours:  Temperature 96.2 pulse 88 respiration 16 blood pressure 126/48  Physical Exam: General:  Critically ill-appearing  Head:  Normocephalic, atraumatic. Dry oral mucosal membranes  Eyes:  Anicteric  Neck:  Tracheostomy in place  Lungs:   Bilateral rales and rhonchi, vent assisted  Heart:  S1S2 no rubs  Abdomen:   Soft, nontender, bowel sounds present, PEG tube in place  Extremities:  No peripheral edema.  Neurologic:  Awake, alert, will follow simple commands  Skin:  No lesions  GU:  Foley catheter in place    Basic Metabolic Panel: Recent Labs  Lab 09/22/20 0604 09/22/20 0604 09/23/20 1013 09/24/20 0546 09/26/20 1211  NA 138  --  137 140 144  K 5.1  --  5.2* 4.4 4.4  CL 101  --  100 102 103  CO2 23  --  _0 GLUCOSE 119*  --  148* 119* 108*  BUN 94*  --  100* 105* 117*  CREATININE 1.90*  --  2.04* 2.22* 2.07*  CALCIUM 8.1*   < > 8.1* 8.3* 8.6*  PHOS  --   --  4.1  --   --    < > = values in this interval not displayed.    Liver Function Tests: Recent Labs  Lab 09/23/20 1013  ALBUMIN 1.8*   No results for input(s): LIPASE, AMYLASE in the last 168 hours. No results for input(s): AMMONIA in the last 168 hours.  CBC: Recent Labs  Lab 09/22/20 0604 09/24/20 0546 09/26/20 1211  WBC 8.7 8.9 8.9  HGB 7.1* 7.5* 7.9*  HCT 24.5* 25.8* 27.5*  MCV 95.7 97.7 100.4*  PLT 253 264 233    Cardiac Enzymes: No results for input(s): CKTOTAL, CKMB, CKMBINDEX, TROPONINI in the last 168 hours.  BNP: Invalid input(s): POCBNP  CBG: No results for input(s): GLUCAP in the last 168 hours.  Microbiology: Results for orders placed or performed during the hospital  encounter of 09/16/2020  SARS CORONAVIRUS 2 (TAT 6-24 HRS) Nasopharyngeal Nasopharyngeal Swab     Status: None   Collection Time: 09/01/20  1:19 PM   Specimen: Nasopharyngeal Swab  Result Value Ref Range Status   SARS Coronavirus 2 NEGATIVE NEGATIVE Final    Comment: (NOTE) SARS-CoV-2 target nucleic acids are NOT DETECTED.  The SARS-CoV-2 RNA is generally detectable in upper and lower respiratory specimens during the acute phase of infection. Negative results do not preclude SARS-CoV-2 infection, do not rule out co-infections with other pathogens, and should not be used as the sole basis for treatment or other patient management decisions. Negative results must be combined with clinical observations, patient history, and epidemiological information. The expected result is Negative.  Fact Sheet for Patients: SugarRoll.be  Fact Sheet for Healthcare Providers: https://www.woods-mathews.com/  This test is not yet approved or cleared by the [place] FDA and  has been authorized for detection and/or diagnosis of SARS-CoV-2 by FDA under an Emergency Use Authorization (EUA). This EUA will remain  in effect (meaning this test can be used) for the duration of the COVID-19 declaration under Se ction 564(b)(1) of the Act, 21 U.S.C. section 360bbb-3(b)(1), unless the authorization is terminated or revoked sooner.  Performed at [facility], [place] [address] [place], [place] [address]   Culture, Urine     Status: Abnormal   Collection Time: 09/05/20 12:41 PM   Specimen: Urine, Random  Result Value Ref Range Status   Specimen Description URINE, RANDOM  Final   Special Requests NONE  Final   Culture (A)  Final    <10,000 COLONIES/mL INSIGNIFICANT GROWTH Performed at [facility], [place] [address], [place], [place] [address]    Report Status 09/06/2020 FINAL  Final  Culture, blood (routine x 2)     Status: None   Collection Time:  09/05/20  4:16 PM   Specimen: BLOOD  Result Value Ref Range Status   Specimen Description BLOOD RIGHT ANTECUBITAL  Final   Special Requests   Final    BOTTLES DRAWN AEROBIC AND ANAEROBIC Blood Culture adequate volume   Culture   Final    NO GROWTH 5 DAYS Performed at [facility], [place] [address] [place], [place] [address]    Report Status 09/10/2020 FINAL  Final  Culture, blood (routine x 2)     Status: None   Collection Time: 09/05/20  4:16 PM   Specimen: BLOOD LEFT HAND  Result Value Ref Range Status   Specimen Description BLOOD LEFT HAND  Final   Special Requests   Final    BOTTLES DRAWN AEROBIC AND ANAEROBIC Blood Culture adequate volume   Culture   Final    NO GROWTH 5 DAYS Performed at [facility], [place] [address] [place], [place] [address]    Report Status 09/10/2020 FINAL  Final  Aerobic/Anaerobic Culture (surgical/deep wound)     Status: None   Collection Time: 09/07/20  4:09 PM   Specimen: Pleural Fluid  Result Value Ref Range Status   Specimen Description FLUID RIGHT PLEURAL  Final   Special Requests Normal  Final   Gram Stain   Final    MODERATE WBC PRESENT,BOTH PMN AND MONONUCLEAR NO ORGANISMS SEEN    Culture   Final    No growth aerobically or anaerobically. Performed at [facility], [place] [address] [place], [place] [address]    Report Status 09/14/2020 FINAL  Final    Coagulation Studies: No results for input(s): LABPROT, INR in the last 72 hours.  Urinalysis: No results for input(s): COLORURINE, LABSPEC, PHURINE, GLUCOSEU, HGBUR, BILIRUBINUR, KETONESUR, PROTEINUR, UROBILINOGEN, NITRITE, LEUKOCYTESUR in the last 72 hours.  Invalid input(s): APPERANCEUR    Imaging: No results found.   Medications:       Assessment/ Plan:  79 y.o. male with a PMHx of hyperlipidemia, hypertension, coronary artery disease, chronic diastolic heart failure, COPD, diabetes mellitus type 2, who was admitted to [facility] on 08/27/2020 for  ongoing treatment of acute respiratory failure.  1.  Acute kidney injury/chronic kidney disease stage IIIB baseline EGFR 38.  Suspect acute kidney injury now related to concurrent illness and dehydration.   -BUN rising again.  Has significant azotemia out of proportion to rise in Cr.  We will switch the patient off of Nepro and onto Osmolite.  2.  Anemia of chronic kidney disease.  Hemoglobin currently 7.9.  Consider transfusion for hemoglobin of 7 or less.  3.  Secondary hyperparathyroidism.  Most recent serum phosphorus was 4.1.  Continue to monitor.  LOS: 0 Sebastiana Wuest 10/11/20218:10 AM

## 2020-09-29 ENCOUNTER — Other Ambulatory Visit (HOSPITAL_COMMUNITY): Payer: Medicare Other

## 2020-09-29 DIAGNOSIS — I5042 Chronic combined systolic (congestive) and diastolic (congestive) heart failure: Secondary | ICD-10-CM | POA: Diagnosis not present

## 2020-09-29 DIAGNOSIS — J9621 Acute and chronic respiratory failure with hypoxia: Secondary | ICD-10-CM | POA: Diagnosis not present

## 2020-09-29 DIAGNOSIS — I482 Chronic atrial fibrillation, unspecified: Secondary | ICD-10-CM | POA: Diagnosis not present

## 2020-09-29 DIAGNOSIS — J449 Chronic obstructive pulmonary disease, unspecified: Secondary | ICD-10-CM | POA: Diagnosis not present

## 2020-09-29 NOTE — Progress Notes (Addendum)
Pulmonary Critical Care Medicine [facility] [place]   PULMONARY CRITICAL CARE SERVICE  PROGRESS NOTE  Date of Service: 09/29/2020  Eric Knapp  IRC:[id number]  DOB: 1941/02/24   DOA: 08/22/2020  Referring Physician: Carron Curie, MD  HPI: Eric Knapp is a 79 y.o. male seen for follow up of Acute on Chronic Respiratory Failure.  Patient remains on aerosol trach collar at this time satting well no fever or distress.  Medications: Reviewed on Rounds  Physical Exam:  Vitals: Pulse 89 respirations 26 BP 113/56 O2 sat 99% temp 97.5  Ventilator Settings not currently on ventilator  . General: Comfortable at this time . Eyes: Grossly normal lids, irises & conjunctiva . ENT: grossly tongue is normal . Neck: no obvious mass . Cardiovascular: S1 S2 normal no gallop . Respiratory: Coarse breath sounds . Abdomen: soft . Skin: no rash seen on limited exam . Musculoskeletal: not rigid . Psychiatric:unable to assess . Neurologic: no seizure no involuntary movements         Lab Data:   Basic Metabolic Panel: Recent Labs  Lab 09/23/20 1013 09/24/20 0546 09/26/20 1211 09/28/20 1031  NA 137 140 144 143  K 5.2* 4.4 4.4 4.9  CL 100 102 103 104  CO2 23 26 30 30   GLUCOSE 148* 119* 108* 62*  BUN 100* 105* 117* 115*  CREATININE 2.04* 2.22* 2.07* 1.82*  CALCIUM 8.1* 8.3* 8.6* 8.6*  PHOS 4.1  --   --   --     ABG: Recent Labs  Lab 09/25/20 1750 09/25/20 2127  PHART 7.299* 7.380  PCO2ART 63.8* 51.2*  PO2ART 34.6* 148*  HCO3 30.7* 29.6*  O2SAT 62.5 99.8    Liver Function Tests: Recent Labs  Lab 09/23/20 1013  ALBUMIN 1.8*   No results for input(s): LIPASE, AMYLASE in the last 168 hours. No results for input(s): AMMONIA in the last 168 hours.  CBC: Recent Labs  Lab 09/24/20 0546 09/26/20 1211  WBC 8.9 8.9  HGB 7.5* 7.9*  HCT 25.8* 27.5*  MCV 97.7 100.4*  PLT 264 233    Cardiac Enzymes: No results for input(s): CKTOTAL, CKMB, CKMBINDEX, TROPONINI in  the last 168 hours.  BNP (last 3 results) Recent Labs    09/04/20 1646 09/05/20 1007  BNP 389.9* 458.9*    ProBNP (last 3 results) No results for input(s): PROBNP in the last 8760 hours.  Radiological Exams: DG Chest Port 1 View  Result Date: 09/29/2020 CLINICAL DATA:  Follow-up chest tube. EXAM: PORTABLE CHEST 1 VIEW COMPARISON:  09/25/2020 FINDINGS: Stable position of right chest tube. Layering right pleural effusion is again noted and appears unchanged. Layering left pleural effusion is also noted with veil like opacification of the left mid and left lower lobe. Mild pulmonary edema, unchanged. No airspace consolidation. IMPRESSION: 1. No change in bilateral pleural effusions and mild pulmonary edema. 2. Stable position of right chest tube. Electronically Signed   By: 11/25/2020 M.D.   On: 09/29/2020 10:46    Assessment/Plan Active Problems:   Acute on chronic respiratory failure with hypoxia (HCC)   Chronic combined systolic and diastolic heart failure (HCC)   COPD, severe (HCC)   Chronic atrial fibrillation (HCC)   Metabolic encephalopathy   Pleural effusion, right   1. Acute on chronic respiratory failure with hypoxia patient will continue on 20% aerosol trach collar satting well at this time has a goal of 8 hours.  Continue supportive measures and aggressive pulmonary toilet. 2. Chronic combined systolic diastolic heart failure  monitor fluid status closely patient is being seen by nephrology. 3. Severe COPD medical management 4. Chronic atrial fibrillation rate controlled 5. Metabolic encephalopathy supportive care at baseline 6. Pleural effusion follow-up x-rays as needed   I have personally seen and evaluated the patient, evaluated laboratory and imaging results, formulated the assessment and plan and placed orders. The Patient requires high complexity decision making with multiple systems involvement.  Rounds were done with the Respiratory Therapy Director and  Staff therapists and discussed with nursing staff also.  Yevonne Pax, MD [facility] Pulmonary Critical Care Medicine Sleep Medicine

## 2020-09-29 NOTE — Progress Notes (Signed)
Referring Physician(s): Dr. Manson Passey   Supervising Physician: Dr. Elby Showers  Patient Status:  [facility]   Chief Complaint: Right loculated pleural effusion/empyema s/p right chest tube placement in IR 09/07/2020.  Subjective: Patient in bed, awake and alert. He is able to follow simple commands. Right chest tube noted to have massive air leak; crack in catheter tubing identified.   Allergies: Patient has no allergy information on record.  Medications: Prior to Admission medications   Not on File     Vital Signs: Temp: 96.3; BP 100/58  Pulse 68   Resp 23   SpO2 99 % trach collar  Physical Exam Constitutional:      General: He is not in acute distress. Cardiovascular:     Rate and Rhythm: Normal rate and regular rhythm.  Pulmonary:     Effort: Pulmonary effort is normal.     Comments: Trach collar. Right chest tube in place. Air leak/crack in tubing observed.  Skin:    General: Skin is warm and dry.  Neurological:     Mental Status: He is alert.     Comments: Able to follow simple commands     Imaging: DG Chest Port 1 View  Result Date: 09/29/2020 CLINICAL DATA:  Follow-up chest tube. EXAM: PORTABLE CHEST 1 VIEW COMPARISON:  09/25/2020 FINDINGS: Stable position of right chest tube. Layering right pleural effusion is again noted and appears unchanged. Layering left pleural effusion is also noted with veil like opacification of the left mid and left lower lobe. Mild pulmonary edema, unchanged. No airspace consolidation. IMPRESSION: 1. No change in bilateral pleural effusions and mild pulmonary edema. 2. Stable position of right chest tube. Electronically Signed   By: Signa Kell M.D.   On: 09/29/2020 10:46    Labs:  CBC: Recent Labs    09/18/20 1149 09/22/20 0604 09/24/20 0546 09/26/20 1211  WBC 9.9 8.7 8.9 8.9  HGB 8.3* 7.1* 7.5* 7.9*  HCT 28.1* 24.5* 25.8* 27.5*  PLT 228 253 264 233    COAGS: Recent Labs    09/01/20 0955  INR 1.7*     BMP: Recent Labs    09/16/20 1142 09/17/20 0456 09/18/20 0828 09/18/20 0828 09/20/20 0438 09/20/20 2245 09/22/20 0604 09/22/20 0604 09/23/20 1013 09/24/20 0546 09/26/20 1211 09/28/20 1031  NA 138  --  143   < > 140  --  138   < > 137 140 144 143  K 5.9*   < > 5.1   < > 5.2*   < > 5.1   < > 5.2* 4.4 4.4 4.9  CL 105  --  106   < > 104  --  101   < > 100 102 103 104  CO2 25  --  27   < > 25  --  23   < > 23 26 30 30   GLUCOSE 159*  --  128*   < > 104*  --  119*   < > 148* 119* 108* 62*  BUN 101*  --  98*   < > 95*  --  94*   < > 100* 105* 117* 115*  CALCIUM 7.9*  --  8.1*   < > 8.4*  --  8.1*   < > 8.1* 8.3* 8.6* 8.6*  CREATININE 1.77*  --  1.79*   < > 1.83*  --  1.90*   < > 2.04* 2.22* 2.07* 1.82*  GFRNONAA 36*  --  36*   < > 35*  --  33*   < > 31* 28* 30* 35*  GFRAA 42*  --  42*  --  41*  --  39*  --   --   --   --   --    < > = values in this interval not displayed.    LIVER FUNCTION TESTS: Recent Labs    09/01/20 0955 09/08/20 0914 09/09/20 1947 09/14/20 0936 09/16/20 1142 09/23/20 1013  BILITOT 1.1  --   --   --   --   --   AST 45*  --   --   --   --   --   ALT 44  --   --   --   --   --   ALKPHOS 106  --   --   --   --   --   PROT 6.4*  --   --   --   --   --   ALBUMIN 2.1*   < > 1.8* 1.7* 1.7* 1.8*   < > = values in this interval not displayed.    Assessment and Plan:  Right loculated pleural effusion/empyema s/p right chest tube placement in IR 09/07/2020: Air leak and crack in catheter identified. CXR shows tube to be partially retracted; no change in bilateral pulmonary effusions. Per bedside RN output has been about 30 cc/shift. Chest tube pulled without difficulty. [facility] MD and bedside RN notified.   Will re-check a CXR in one hour. If no complications present IR will sign off.   Electronically Signed: Alwyn Ren, AGACNP-BC [phone] 09/29/2020, 11:52 AM   I spent a total of 35 Minutes at the the patient's bedside AND on the patient's  hospital floor or unit, greater than 50% of which was counseling/coordinating care for right chest tube care.

## 2020-09-30 DIAGNOSIS — J449 Chronic obstructive pulmonary disease, unspecified: Secondary | ICD-10-CM | POA: Diagnosis not present

## 2020-09-30 DIAGNOSIS — I5042 Chronic combined systolic (congestive) and diastolic (congestive) heart failure: Secondary | ICD-10-CM | POA: Diagnosis not present

## 2020-09-30 DIAGNOSIS — I482 Chronic atrial fibrillation, unspecified: Secondary | ICD-10-CM | POA: Diagnosis not present

## 2020-09-30 DIAGNOSIS — J9621 Acute and chronic respiratory failure with hypoxia: Secondary | ICD-10-CM | POA: Diagnosis not present

## 2020-09-30 LAB — BASIC METABOLIC PANEL
Anion gap: 9 (ref 5–15)
BUN: 112 mg/dL — ABNORMAL HIGH (ref 8–23)
CO2: 33 mmol/L — ABNORMAL HIGH (ref 22–32)
Calcium: 8.6 mg/dL — ABNORMAL LOW (ref 8.9–10.3)
Chloride: 98 mmol/L (ref 98–111)
Creatinine, Ser: 1.94 mg/dL — ABNORMAL HIGH (ref 0.61–1.24)
GFR, Estimated: 33 mL/min — ABNORMAL LOW (ref >60)
Glucose, Bld: 218 mg/dL — ABNORMAL HIGH (ref 70–99)
Potassium: 4.4 mmol/L (ref 3.5–5.1)
Sodium: 140 mmol/L (ref 135–145)

## 2020-09-30 LAB — CBC
HCT: 26.8 % — ABNORMAL LOW (ref 39.0–52.0)
Hemoglobin: 7.6 g/dL — ABNORMAL LOW (ref 13.0–17.0)
MCH: 28.9 pg (ref 26.0–34.0)
MCHC: 28.4 g/dL — ABNORMAL LOW (ref 30.0–36.0)
MCV: 101.9 fL — ABNORMAL HIGH (ref 80.0–100.0)
Platelets: 187 10*3/uL (ref 150–400)
RBC: 2.63 MIL/uL — ABNORMAL LOW (ref 4.22–5.81)
RDW: 24.4 % — ABNORMAL HIGH (ref 11.5–15.5)
WBC: 7.8 10*3/uL (ref 4.0–10.5)
nRBC: 0 % (ref 0.0–0.2)

## 2020-09-30 NOTE — Progress Notes (Addendum)
Pulmonary Critical Care Medicine [facility] [place]   PULMONARY CRITICAL CARE SERVICE  PROGRESS NOTE  Date of Service: 09/30/2020  Kenyatta Keidel  KXF:[id number]  DOB: 1941-10-30   DOA: 09/15/2020  Referring Physician: Carron Curie, MD  HPI: Tyvon Eggenberger is a 79 y.o. male seen for follow up of Acute on Chronic Respiratory Failure.  Patient continues on 20% aerosol trach collar for 16-hour goal today satting well no distress.    Medications: Reviewed on Rounds  Physical Exam:  Vitals: Pulse 98 respirations 22 BP 118/71 O2 sat 97% temp 98.9  Ventilator Settings not currently on ventilator  . General: Comfortable at this time . Eyes: Grossly normal lids, irises & conjunctiva . ENT: grossly tongue is normal . Neck: no obvious mass . Cardiovascular: S1 S2 normal no gallop . Respiratory: No rales or rhonchi noted . Abdomen: soft . Skin: no rash seen on limited exam . Musculoskeletal: not rigid . Psychiatric:unable to assess . Neurologic: no seizure no involuntary movements         Lab Data:   Basic Metabolic Panel: Recent Labs  Lab 09/24/20 0546 09/26/20 1211 09/28/20 1031 09/30/20 0552  NA 140 144 143 140  K 4.4 4.4 4.9 4.4  CL 102 103 104 98  CO2 26 30 30  33*  GLUCOSE 119* 108* 62* 218*  BUN 105* 117* 115* 112*  CREATININE 2.22* 2.07* 1.82* 1.94*  CALCIUM 8.3* 8.6* 8.6* 8.6*    ABG: Recent Labs  Lab 09/25/20 1750 09/25/20 2127  PHART 7.299* 7.380  PCO2ART 63.8* 51.2*  PO2ART 34.6* 148*  HCO3 30.7* 29.6*  O2SAT 62.5 99.8    Liver Function Tests: No results for input(s): AST, ALT, ALKPHOS, BILITOT, PROT, ALBUMIN in the last 168 hours. No results for input(s): LIPASE, AMYLASE in the last 168 hours. No results for input(s): AMMONIA in the last 168 hours.  CBC: Recent Labs  Lab 09/24/20 0546 09/26/20 1211 09/30/20 0552  WBC 8.9 8.9 7.8  HGB 7.5* 7.9* 7.6*  HCT 25.8* 27.5* 26.8*  MCV 97.7 100.4* 101.9*  PLT 264 233 187    Cardiac  Enzymes: No results for input(s): CKTOTAL, CKMB, CKMBINDEX, TROPONINI in the last 168 hours.  BNP (last 3 results) Recent Labs    09/04/20 1646 09/05/20 1007  BNP 389.9* 458.9*    ProBNP (last 3 results) No results for input(s): PROBNP in the last 8760 hours.  Radiological Exams: DG Chest Port 1 View  Result Date: 09/29/2020 CLINICAL DATA:  Status post right chest tube removal. EXAM: PORTABLE CHEST 1 VIEW COMPARISON:  Single-view of the chest earlier today. FINDINGS: One of two pigtail catheters in the right lower chest has been removed. No pneumothorax. Tracheostomy tube remains in place. Right worse than left airspace disease and effusions are unchanged. IMPRESSION: Negative for right pneumothorax after chest tube removal. No other change. Electronically Signed   By: 11/29/2020 M.D.   On: 09/29/2020 14:02   DG Chest Port 1 View  Result Date: 09/29/2020 CLINICAL DATA:  Follow-up chest tube. EXAM: PORTABLE CHEST 1 VIEW COMPARISON:  09/25/2020 FINDINGS: Stable position of right chest tube. Layering right pleural effusion is again noted and appears unchanged. Layering left pleural effusion is also noted with veil like opacification of the left mid and left lower lobe. Mild pulmonary edema, unchanged. No airspace consolidation. IMPRESSION: 1. No change in bilateral pleural effusions and mild pulmonary edema. 2. Stable position of right chest tube. Electronically Signed   By: 11/25/2020.D.  On: 09/29/2020 10:46    Assessment/Plan Active Problems:   Acute on chronic respiratory failure with hypoxia (HCC)   Chronic combined systolic and diastolic heart failure (HCC)   COPD, severe (HCC)   Chronic atrial fibrillation (HCC)   Metabolic encephalopathy   Pleural effusion, right   1. Acute on chronic respiratory failure with hypoxia  patient will continue on 28% T-bar with a 16-hour goal we will continue aggressive pulmonary toilet supportive measures. 2. Chronic combined  systolic diastolic heart failure monitor fluid status closely patient is being seen by nephrology. 3. Severe COPD medical management 4. Chronic atrial fibrillation rate controlled 5. Metabolic encephalopathy supportive care at baseline 6. Pleural effusion follow-up x-rays as needed   I have personally seen and evaluated the patient, evaluated laboratory and imaging results, formulated the assessment and plan and placed orders. The Patient requires high complexity decision making with multiple systems involvement.  Rounds were done with the Respiratory Therapy Director and Staff therapists and discussed with nursing staff also.  Yevonne Pax, MD [facility] Pulmonary Critical Care Medicine Sleep Medicine

## 2020-10-01 DIAGNOSIS — I482 Chronic atrial fibrillation, unspecified: Secondary | ICD-10-CM | POA: Diagnosis not present

## 2020-10-01 DIAGNOSIS — I5042 Chronic combined systolic (congestive) and diastolic (congestive) heart failure: Secondary | ICD-10-CM | POA: Diagnosis not present

## 2020-10-01 DIAGNOSIS — J449 Chronic obstructive pulmonary disease, unspecified: Secondary | ICD-10-CM | POA: Diagnosis not present

## 2020-10-01 DIAGNOSIS — J9621 Acute and chronic respiratory failure with hypoxia: Secondary | ICD-10-CM | POA: Diagnosis not present

## 2020-10-01 NOTE — Progress Notes (Addendum)
Pulmonary Critical Care Medicine [facility] [place]   PULMONARY CRITICAL CARE SERVICE  PROGRESS NOTE  Date of Service: 10/01/2020  Eric Knapp  OVZ:[id number]  DOB: 09-30-1941   DOA: 09/16/2020  Referring Physician: Carron Curie, MD  HPI: Eric Knapp is a 79 y.o. male seen for follow up of Acute on Chronic Respiratory Failure.  Patient remains weaning on T-bar at this time 28% FiO2 satting well no distress.  Medications: Reviewed on Rounds  Physical Exam:  Vitals: Pulse 88 respirations 24 BP 125/65 O2 sat 100% temp 96.3  Ventilator Settings not currently on ventilator  . General: Comfortable at this time . Eyes: Grossly normal lids, irises & conjunctiva . ENT: grossly tongue is normal . Neck: no obvious mass . Cardiovascular: S1 S2 normal no gallop . Respiratory: No rales or rhonchi noted . Abdomen: soft . Skin: no rash seen on limited exam . Musculoskeletal: not rigid . Psychiatric:unable to assess . Neurologic: no seizure no involuntary movements         Lab Data:   Basic Metabolic Panel: Recent Labs  Lab 09/26/20 1211 09/28/20 1031 09/30/20 0552  NA 144 143 140  K 4.4 4.9 4.4  CL 103 104 98  CO2 30 30 33*  GLUCOSE 108* 62* 218*  BUN 117* 115* 112*  CREATININE 2.07* 1.82* 1.94*  CALCIUM 8.6* 8.6* 8.6*    ABG: Recent Labs  Lab 09/25/20 1750 09/25/20 2127  PHART 7.299* 7.380  PCO2ART 63.8* 51.2*  PO2ART 34.6* 148*  HCO3 30.7* 29.6*  O2SAT 62.5 99.8    Liver Function Tests: No results for input(s): AST, ALT, ALKPHOS, BILITOT, PROT, ALBUMIN in the last 168 hours. No results for input(s): LIPASE, AMYLASE in the last 168 hours. No results for input(s): AMMONIA in the last 168 hours.  CBC: Recent Labs  Lab 09/26/20 1211 09/30/20 0552  WBC 8.9 7.8  HGB 7.9* 7.6*  HCT 27.5* 26.8*  MCV 100.4* 101.9*  PLT 233 187    Cardiac Enzymes: No results for input(s): CKTOTAL, CKMB, CKMBINDEX, TROPONINI in the last 168 hours.  BNP (last 3  results) Recent Labs    09/04/20 1646 09/05/20 1007  BNP 389.9* 458.9*    ProBNP (last 3 results) No results for input(s): PROBNP in the last 8760 hours.  Radiological Exams: DG Chest Port 1 View  Result Date: 09/29/2020 CLINICAL DATA:  Status post right chest tube removal. EXAM: PORTABLE CHEST 1 VIEW COMPARISON:  Single-view of the chest earlier today. FINDINGS: One of two pigtail catheters in the right lower chest has been removed. No pneumothorax. Tracheostomy tube remains in place. Right worse than left airspace disease and effusions are unchanged. IMPRESSION: Negative for right pneumothorax after chest tube removal. No other change. Electronically Signed   By: Drusilla Kanner M.D.   On: 09/29/2020 14:02    Assessment/Plan Active Problems:   Acute on chronic respiratory failure with hypoxia (HCC)   Chronic combined systolic and diastolic heart failure (HCC)   COPD, severe (HCC)   Chronic atrial fibrillation (HCC)   Metabolic encephalopathy   Pleural effusion, right   1. Acute on chronic respiratory failure with hypoxiapatient will continue to wean for goal of 20 hours on 28% T-bar.  We will continue aggressive pulmonary toilet supportive measures. 2. Chronic combined systolic diastolic heart failure monitor fluid status closely patient is being seen by nephrology. 3. Severe COPD medical management 4. Chronic atrial fibrillation rate controlled 5. Metabolic encephalopathy supportive care at baseline 6. Pleural effusion follow-up x-rays as  needed   I have personally seen and evaluated the patient, evaluated laboratory and imaging results, formulated the assessment and plan and placed orders. The Patient requires high complexity decision making with multiple systems involvement.  Rounds were done with the Respiratory Therapy Director and Staff therapists and discussed with nursing staff also.  Yevonne Pax, MD [facility] Pulmonary Critical Care Medicine Sleep Medicine

## 2020-10-02 DIAGNOSIS — I482 Chronic atrial fibrillation, unspecified: Secondary | ICD-10-CM | POA: Diagnosis not present

## 2020-10-02 DIAGNOSIS — J9621 Acute and chronic respiratory failure with hypoxia: Secondary | ICD-10-CM | POA: Diagnosis not present

## 2020-10-02 DIAGNOSIS — J449 Chronic obstructive pulmonary disease, unspecified: Secondary | ICD-10-CM | POA: Diagnosis not present

## 2020-10-02 DIAGNOSIS — I5042 Chronic combined systolic (congestive) and diastolic (congestive) heart failure: Secondary | ICD-10-CM | POA: Diagnosis not present

## 2020-10-02 LAB — CBC
HCT: 28.2 % — ABNORMAL LOW (ref 39.0–52.0)
Hemoglobin: 8.2 g/dL — ABNORMAL LOW (ref 13.0–17.0)
MCH: 29.8 pg (ref 26.0–34.0)
MCHC: 29.1 g/dL — ABNORMAL LOW (ref 30.0–36.0)
MCV: 102.5 fL — ABNORMAL HIGH (ref 80.0–100.0)
Platelets: 168 10*3/uL (ref 150–400)
RBC: 2.75 MIL/uL — ABNORMAL LOW (ref 4.22–5.81)
RDW: 23.9 % — ABNORMAL HIGH (ref 11.5–15.5)
WBC: 8.1 10*3/uL (ref 4.0–10.5)
nRBC: 0 % (ref 0.0–0.2)

## 2020-10-02 LAB — BASIC METABOLIC PANEL
Anion gap: 11 (ref 5–15)
BUN: 126 mg/dL — ABNORMAL HIGH (ref 8–23)
CO2: 33 mmol/L — ABNORMAL HIGH (ref 22–32)
Calcium: 9 mg/dL (ref 8.9–10.3)
Chloride: 102 mmol/L (ref 98–111)
Creatinine, Ser: 2.05 mg/dL — ABNORMAL HIGH (ref 0.61–1.24)
GFR, Estimated: 31 mL/min — ABNORMAL LOW (ref >60)
Glucose, Bld: 118 mg/dL — ABNORMAL HIGH (ref 70–99)
Potassium: 5 mmol/L (ref 3.5–5.1)
Sodium: 146 mmol/L — ABNORMAL HIGH (ref 135–145)

## 2020-10-02 NOTE — Progress Notes (Addendum)
Pulmonary Critical Care Medicine [facility] [place]   PULMONARY CRITICAL CARE SERVICE  PROGRESS NOTE  Date of Service: 10/02/2020  Phoenyx Paulsen  YQI:[id number]  DOB: 09-01-1941   DOA: 09/10/2020  Referring Physician: Carron Curie, MD  HPI: Jaymarion Trombly is a 79 y.o. male seen for follow up of Acute on Chronic Respiratory Failure.  Patient remains on pressure support at this time 28% FiO2 satting well no fever or distress.  Medications: Reviewed on Rounds  Physical Exam:  Vitals: Pulse 98 respirations 28 BP 117/70 O2 sat 91% temp 96.5  Ventilator Settings pressure support 12/5 FiO2 28%  . General: Comfortable at this time . Eyes: Grossly normal lids, irises & conjunctiva . ENT: grossly tongue is normal . Neck: no obvious mass . Cardiovascular: S1 S2 normal no gallop . Respiratory: Coarse breath sounds . Abdomen: soft . Skin: no rash seen on limited exam . Musculoskeletal: not rigid . Psychiatric:unable to assess . Neurologic: no seizure no involuntary movements         Lab Data:   Basic Metabolic Panel: Recent Labs  Lab 09/26/20 1211 09/28/20 1031 09/30/20 0552 10/02/20 0429  NA 144 143 140 146*  K 4.4 4.9 4.4 5.0  CL 103 104 98 102  CO2 30 30 33* 33*  GLUCOSE 108* 62* 218* 118*  BUN 117* 115* 112* 126*  CREATININE 2.07* 1.82* 1.94* 2.05*  CALCIUM 8.6* 8.6* 8.6* 9.0    ABG: Recent Labs  Lab 09/25/20 1750 09/25/20 2127  PHART 7.299* 7.380  PCO2ART 63.8* 51.2*  PO2ART 34.6* 148*  HCO3 30.7* 29.6*  O2SAT 62.5 99.8    Liver Function Tests: No results for input(s): AST, ALT, ALKPHOS, BILITOT, PROT, ALBUMIN in the last 168 hours. No results for input(s): LIPASE, AMYLASE in the last 168 hours. No results for input(s): AMMONIA in the last 168 hours.  CBC: Recent Labs  Lab 09/26/20 1211 09/30/20 0552 10/02/20 0429  WBC 8.9 7.8 8.1  HGB 7.9* 7.6* 8.2*  HCT 27.5* 26.8* 28.2*  MCV 100.4* 101.9* 102.5*  PLT 233 187 168    Cardiac  Enzymes: No results for input(s): CKTOTAL, CKMB, CKMBINDEX, TROPONINI in the last 168 hours.  BNP (last 3 results) Recent Labs    09/04/20 1646 09/05/20 1007  BNP 389.9* 458.9*    ProBNP (last 3 results) No results for input(s): PROBNP in the last 8760 hours.  Radiological Exams: No results found.  Assessment/Plan Active Problems:   Acute on chronic respiratory failure with hypoxia (HCC)   Chronic combined systolic and diastolic heart failure (HCC)   COPD, severe (HCC)   Chronic atrial fibrillation (HCC)   Metabolic encephalopathy   Pleural effusion, right   1. Acute on chronic respiratory failure with hypoxiapatient will continue on pressure support at this time we will continue to wean to T-bar as tolerated.  We will continue aggressive pulmonary toilet supportive measures. 2. Chronic combined systolic diastolic heart failure monitor fluid status closely patient is being seen by nephrology. 3. Severe COPD medical management 4. Chronic atrial fibrillation rate controlled 5. Metabolic encephalopathy supportive care at baseline 6. Pleural effusion follow-up x-rays as needed   I have personally seen and evaluated the patient, evaluated laboratory and imaging results, formulated the assessment and plan and placed orders. The Patient requires high complexity decision making with multiple systems involvement.  Rounds were done with the Respiratory Therapy Director and Staff therapists and discussed with nursing staff also.  Yevonne Pax, MD [facility] Pulmonary Critical Care Medicine Sleep  Medicine

## 2020-10-03 DIAGNOSIS — J9621 Acute and chronic respiratory failure with hypoxia: Secondary | ICD-10-CM | POA: Diagnosis not present

## 2020-10-03 DIAGNOSIS — J449 Chronic obstructive pulmonary disease, unspecified: Secondary | ICD-10-CM | POA: Diagnosis not present

## 2020-10-03 DIAGNOSIS — I5042 Chronic combined systolic (congestive) and diastolic (congestive) heart failure: Secondary | ICD-10-CM | POA: Diagnosis not present

## 2020-10-03 DIAGNOSIS — I482 Chronic atrial fibrillation, unspecified: Secondary | ICD-10-CM | POA: Diagnosis not present

## 2020-10-03 LAB — MAGNESIUM: Magnesium: 2.3 mg/dL (ref 1.7–2.4)

## 2020-10-03 LAB — BASIC METABOLIC PANEL
Anion gap: 9 (ref 5–15)
BUN: 124 mg/dL — ABNORMAL HIGH (ref 8–23)
CO2: 35 mmol/L — ABNORMAL HIGH (ref 22–32)
Calcium: 8.6 mg/dL — ABNORMAL LOW (ref 8.9–10.3)
Chloride: 101 mmol/L (ref 98–111)
Creatinine, Ser: 2.07 mg/dL — ABNORMAL HIGH (ref 0.61–1.24)
GFR, Estimated: 30 mL/min — ABNORMAL LOW (ref >60)
Glucose, Bld: 174 mg/dL — ABNORMAL HIGH (ref 70–99)
Potassium: 5.2 mmol/L — ABNORMAL HIGH (ref 3.5–5.1)
Sodium: 145 mmol/L (ref 135–145)

## 2020-10-03 NOTE — Progress Notes (Signed)
Pulmonary Critical Care Medicine [facility] [place]   PULMONARY CRITICAL CARE SERVICE  PROGRESS NOTE  Date of Service: 10/03/2020  Eric Knapp  MGQ:[id number]  DOB: [birth year]   DOA: 2020/09/26  Referring Physician: Carron Curie, MD  HPI: Eric Knapp is a 79 y.o. male seen for follow up of Acute on Chronic Respiratory Failure.  Patient is on pressure support mode right now on 28% FiO2 has been on a pressure of 12/5  Medications: Reviewed on Rounds  Physical Exam:  Vitals: Temperature is 97.4 pulse 92 respiratory 22 blood pressure is 120/66 saturations 97%  Ventilator Settings on pressure support FiO2 28% pressure 12/5  . General: Comfortable at this time . Eyes: Grossly normal lids, irises & conjunctiva . ENT: grossly tongue is normal . Neck: no obvious mass . Cardiovascular: S1 S2 normal no gallop . Respiratory: No rhonchi very coarse breath sounds . Abdomen: soft . Skin: no rash seen on limited exam . Musculoskeletal: not rigid . Psychiatric:unable to assess . Neurologic: no seizure no involuntary movements         Lab Data:   Basic Metabolic Panel: Recent Labs  Lab 09/26/20 1211 09/28/20 1031 09/30/20 0552 10/02/20 0429  NA 144 143 140 146*  K 4.4 4.9 4.4 5.0  CL 103 104 98 102  CO2 30 30 33* 33*  GLUCOSE 108* 62* 218* 118*  BUN 117* 115* 112* 126*  CREATININE 2.07* 1.82* 1.94* 2.05*  CALCIUM 8.6* 8.6* 8.6* 9.0    ABG: No results for input(s): PHART, PCO2ART, PO2ART, HCO3, O2SAT in the last 168 hours.  Liver Function Tests: No results for input(s): AST, ALT, ALKPHOS, BILITOT, PROT, ALBUMIN in the last 168 hours. No results for input(s): LIPASE, AMYLASE in the last 168 hours. No results for input(s): AMMONIA in the last 168 hours.  CBC: Recent Labs  Lab 09/26/20 1211 09/30/20 0552 10/02/20 0429  WBC 8.9 7.8 8.1  HGB 7.9* 7.6* 8.2*  HCT 27.5* 26.8* 28.2*  MCV 100.4* 101.9* 102.5*  PLT 233 187 168    Cardiac Enzymes: No results  for input(s): CKTOTAL, CKMB, CKMBINDEX, TROPONINI in the last 168 hours.  BNP (last 3 results) Recent Labs    09/04/20 1646 09/05/20 1007  BNP 389.9* 458.9*    ProBNP (last 3 results) No results for input(s): PROBNP in the last 8760 hours.  Radiological Exams: No results found.  Assessment/Plan Active Problems:   Acute on chronic respiratory failure with hypoxia (HCC)   Chronic combined systolic and diastolic heart failure (HCC)   COPD, severe (HCC)   Chronic atrial fibrillation (HCC)   Metabolic encephalopathy   Pleural effusion, right   1. Acute on chronic respiratory failure with hypoxia we will continue with pressure support mode titrate oxygen continue pulmonary toilet. 2. Chronic combined systolic diastolic heart failure appears to be compensated 3. Severe COPD medical management 4. Chronic atrial fibrillation rate controlled 5. Metabolic encephalopathy no change 6. Pleural effusion follow x-ray as necessary   I have personally seen and evaluated the patient, evaluated laboratory and imaging results, formulated the assessment and plan and placed orders. The Patient requires high complexity decision making with multiple systems involvement.  Rounds were done with the Respiratory Therapy Director and Staff therapists and discussed with nursing staff also.  Yevonne Pax, MD [facility] Pulmonary Critical Care Medicine Sleep Medicine

## 2020-10-03 NOTE — Consult Note (Addendum)
Referring Physician: Dr. Darlyn Read is an 79 y.o. male.                       Chief Complaint: Recurrent non-sustained VT  HPI: 79 years old white male with PMH of acute on chronic respiratory failure, HTN, Hyperlipidemia, CAD, chronic systolic and diastolic heart failure, COPD, type 2 DM, CKD stage III had sepsis, encephalopathy and respiratory failure with cardiac arrest requiring intubation followed by tracheostomy has recurrent non-sustained VT. He is ventilator dependent and has saturation of 99 % on 28 % FiO2. EKG: atrial fibrillation, low voltage and non-specific T wave changes.   Past Medical History:  Diagnosis Date  . Acute on chronic respiratory failure with hypoxia (HCC)   . Chronic atrial fibrillation (HCC)   . Chronic combined systolic and diastolic heart failure (HCC)   . COPD, severe (HCC)   . Metabolic encephalopathy   . Pleural effusion, right     PSH: Tracheostomy PEG ERCP Bile duct stent placement  The histories are not reviewed yet. Please review them in the "History" navigator section and refresh this SmartLink.  Family history: Non-contributory.  Social History:  has no history on file for tobacco use, alcohol use, and drug use.  Allergies: Not on File  No medications prior to admission.  See medication list  Results for orders placed or performed during the hospital encounter of 09-23-2020 (from the past 48 hour(s))  Basic metabolic panel     Status: Abnormal   Collection Time: 10/02/20  4:29 AM  Result Value Ref Range   Sodium 146 (H) 135 - 145 mmol/L   Potassium 5.0 3.5 - 5.1 mmol/L   Chloride 102 98 - 111 mmol/L   CO2 33 (H) 22 - 32 mmol/L   Glucose, Bld 118 (H) 70 - 99 mg/dL    Comment: Glucose reference range applies only to samples taken after fasting for at least 8 hours.   BUN 126 (H) 8 - 23 mg/dL   Creatinine, Ser 1.15 (H) 0.61 - 1.24 mg/dL   Calcium 9.0 8.9 - 72.6 mg/dL   GFR, Estimated 31 (L) >60 mL/min   Anion gap 11 5 - 15     Comment: Performed at [facility], [address] [place], [place] [address]  CBC     Status: Abnormal   Collection Time: 10/02/20  4:29 AM  Result Value Ref Range   WBC 8.1 4.0 - 10.5 K/uL   RBC 2.75 (L) 4.22 - 5.81 MIL/uL   Hemoglobin 8.2 (L) 13.0 - 17.0 g/dL   HCT 97.4 (L) 39 - 52 %   MCV 102.5 (H) 80.0 - 100.0 fL   MCH 29.8 26.0 - 34.0 pg   MCHC 29.1 (L) 30.0 - 36.0 g/dL   RDW 16.3 (H) 84.5 - 36.4 %   Platelets 168 150 - 400 K/uL   nRBC 0.0 0.0 - 0.2 %    Comment: Performed at [facility], [address] [place], [place] [address]   No results found.  Review Of Systems As per HPI  Vitals today: P: 92, BP: 109/69, R: 26, O2 sat 99 % on 28 % FiO2  Blood pressure (!) 92/39, pulse 82, resp. rate 12, SpO2 100 %. There is no height or weight on file to calculate BMI. General appearance: Ventilator dependent and resting, cooperative, appears stated age and moderate respiratory distress Head: Normocephalic, atraumatic. Eyes: Blue eyes, Pale conjunctiva, corneas clear.  Neck:  No adenopathy, no carotid bruit, no JVD, supple, symmetrical, tracheostomy  Present and thyroid not enlarged. Resp: Clearing to auscultation bilaterally. Cardio: Irregular rate and rhythm, S1, S2 normal, II/VI systolic murmur, no click, rub or gallop GI: Soft, non-tender; bowel sounds normal; no organomegaly. PEG present. Extremities: 2 + edema, no cyanosis or clubbing. Skin: Warm and dry.  Neurologic: Alert and oriented X 0.  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 Continue metoprolol. Check magnesium level, result pending today.  Time spent: Review of old records, Lab, x-rays, EKG, other cardiac tests, examination, discussion with patient, tech, PA over 70  minutes.  Ricki Rodriguez, MD  10/03/2020, 12:24 PM

## 2020-10-04 ENCOUNTER — Other Ambulatory Visit (HOSPITAL_COMMUNITY): Payer: Medicare Other

## 2020-10-04 DIAGNOSIS — I5042 Chronic combined systolic (congestive) and diastolic (congestive) heart failure: Secondary | ICD-10-CM | POA: Diagnosis not present

## 2020-10-04 DIAGNOSIS — J449 Chronic obstructive pulmonary disease, unspecified: Secondary | ICD-10-CM | POA: Diagnosis not present

## 2020-10-04 DIAGNOSIS — J9621 Acute and chronic respiratory failure with hypoxia: Secondary | ICD-10-CM | POA: Diagnosis not present

## 2020-10-04 DIAGNOSIS — I482 Chronic atrial fibrillation, unspecified: Secondary | ICD-10-CM | POA: Diagnosis not present

## 2020-10-04 LAB — BASIC METABOLIC PANEL
Anion gap: 10 (ref 5–15)
BUN: 127 mg/dL — ABNORMAL HIGH (ref 8–23)
CO2: 35 mmol/L — ABNORMAL HIGH (ref 22–32)
Calcium: 8.7 mg/dL — ABNORMAL LOW (ref 8.9–10.3)
Chloride: 102 mmol/L (ref 98–111)
Creatinine, Ser: 2.15 mg/dL — ABNORMAL HIGH (ref 0.61–1.24)
GFR, Estimated: 29 mL/min — ABNORMAL LOW (ref >60)
Glucose, Bld: 148 mg/dL — ABNORMAL HIGH (ref 70–99)
Potassium: 4.8 mmol/L (ref 3.5–5.1)
Sodium: 147 mmol/L — ABNORMAL HIGH (ref 135–145)

## 2020-10-04 NOTE — Progress Notes (Addendum)
Pulmonary Critical Care Medicine [facility] [place]   PULMONARY CRITICAL CARE SERVICE  PROGRESS NOTE  Date of Service: 10/04/2020  Eric Knapp  OEV:[id number]  DOB: [birth year]   DOA: 08/25/2020  Referring Physician: Carron Curie, MD  HPI: Eric Knapp is a 79 y.o. male seen for follow up of Acute on Chronic Respiratory Failure.  Patient remains on aerosol trach collar 28% FiO2 for 24-hour goal satting well no distress.  Medications: Reviewed on Rounds  Physical Exam:  Vitals: Pulse 86 respirations 20 BP 120/69 O2 sat 98% temp 96.8  Ventilator Settings not currently on ventilator  . General: Comfortable at this time . Eyes: Grossly normal lids, irises & conjunctiva . ENT: grossly tongue is normal . Neck: no obvious mass . Cardiovascular: S1 S2 normal no gallop . Respiratory: No rales or rhonchi noted . Abdomen: soft . Skin: no rash seen on limited exam . Musculoskeletal: not rigid . Psychiatric:unable to assess . Neurologic: no seizure no involuntary movements         Lab Data:   Basic Metabolic Panel: Recent Labs  Lab 09/28/20 1031 09/30/20 0552 10/02/20 0429 10/03/20 1154 10/04/20 0422  NA 143 140 146* 145 147*  K 4.9 4.4 5.0 5.2* 4.8  CL 104 98 102 101 102  CO2 30 33* 33* 35* 35*  GLUCOSE 62* 218* 118* 174* 148*  BUN 115* 112* 126* 124* 127*  CREATININE 1.82* 1.94* 2.05* 2.07* 2.15*  CALCIUM 8.6* 8.6* 9.0 8.6* 8.7*  MG  --   --   --  2.3  --     ABG: No results for input(s): PHART, PCO2ART, PO2ART, HCO3, O2SAT in the last 168 hours.  Liver Function Tests: No results for input(s): AST, ALT, ALKPHOS, BILITOT, PROT, ALBUMIN in the last 168 hours. No results for input(s): LIPASE, AMYLASE in the last 168 hours. No results for input(s): AMMONIA in the last 168 hours.  CBC: Recent Labs  Lab 09/30/20 0552 10/02/20 0429  WBC 7.8 8.1  HGB 7.6* 8.2*  HCT 26.8* 28.2*  MCV 101.9* 102.5*  PLT 187 168    Cardiac Enzymes: No results for  input(s): CKTOTAL, CKMB, CKMBINDEX, TROPONINI in the last 168 hours.  BNP (last 3 results) Recent Labs    09/04/20 1646 09/05/20 1007  BNP 389.9* 458.9*    ProBNP (last 3 results) No results for input(s): PROBNP in the last 8760 hours.  Radiological Exams: DG CHEST PORT 1 VIEW  Result Date: 10/04/2020 CLINICAL DATA:  Pleural effusion EXAM: PORTABLE CHEST 1 VIEW COMPARISON:  09/29/2020 FINDINGS: Layering moderate right and small left pleural effusions. Mild right basilar opacities, likely atelectasis. When compared to the prior, the left pleural effusion is improved. No pneumothorax. Tracheostomy in satisfactory position. Mild cardiomegaly. IMPRESSION: Layering moderate right and small left pleural effusions, decreased on the left. Mild right basilar opacities, likely atelectasis. Electronically Signed   By: Charline Bills M.D.   On: 10/04/2020 06:20    Assessment/Plan Active Problems:   Acute on chronic respiratory failure with hypoxia (HCC)   Chronic combined systolic and diastolic heart failure (HCC)   COPD, severe (HCC)   Chronic atrial fibrillation (HCC)   Metabolic encephalopathy   Pleural effusion, right   1. Acute on chronic respiratory failure with hypoxia patient will remain on aerosol trach collar for 24-hour goal at this time 20% FiO2 continue supportive measures pulmonary toilet. 2. Chronic combined systolic diastolic heart failure appears to be compensated 3. Severe COPD medical management 4. Chronic atrial fibrillation rate  controlled 5. Metabolic encephalopathy no change 6. Pleural effusion follow x-ray as necessary   I have personally seen and evaluated the patient, evaluated laboratory and imaging results, formulated the assessment and plan and placed orders. The Patient requires high complexity decision making with multiple systems involvement.  Rounds were done with the Respiratory Therapy Director and Staff therapists and discussed with nursing staff  also.  Yevonne Pax, MD [facility] Pulmonary Critical Care Medicine Sleep Medicine

## 2020-10-04 NOTE — Consult Note (Signed)
Ref: Carron Curie, MD   Subjective:  Awake. Monitor: Atrial fibrillation with occasional VPCs.  Normal magnesium level.  Objective:  Vital Signs in the last 24 hours:  P: 92 R: 20 BP : 120/60 O2 saturation: 97 %.  Physical Exam: BP Readings from Last 1 Encounters:  09/07/20 (!) 92/39     Wt Readings from Last 1 Encounters:  No data found for Wt    Weight change:  There is no height or weight on file to calculate BMI. HEENT: [place]/AT, Eyes-Blue, Conjunctiva-Pale, Sclera-Non-icteric Neck: No JVD, No bruit, Trachea midline. Lungs:  Clearing, Bilateral. Cardiac:  Irregular rhythm, normal S1 and S2, no S3. II/VI systolic murmur. Abdomen:  Soft, non-tender. BS present. Extremities: 1- 2 + edema present. No cyanosis. No clubbing. CNS: AxOx1, Cranial nerves grossly intact.  Skin: Warm and dry.   Intake/Output from previous day: No intake/output data recorded.    Lab Results: BMET    Component Value Date/Time   NA 147 (H) 10/04/2020 0422   NA 145 10/03/2020 1154   NA 146 (H) 10/02/2020 0429   K 4.8 10/04/2020 0422   K 5.2 (H) 10/03/2020 1154   K 5.0 10/02/2020 0429   CL 102 10/04/2020 0422   CL 101 10/03/2020 1154   CL 102 10/02/2020 0429   CO2 35 (H) 10/04/2020 0422   CO2 35 (H) 10/03/2020 1154   CO2 33 (H) 10/02/2020 0429   GLUCOSE 148 (H) 10/04/2020 0422   GLUCOSE 174 (H) 10/03/2020 1154   GLUCOSE 118 (H) 10/02/2020 0429   BUN 127 (H) 10/04/2020 0422   BUN 124 (H) 10/03/2020 1154   BUN 126 (H) 10/02/2020 0429   CREATININE 2.15 (H) 10/04/2020 0422   CREATININE 2.07 (H) 10/03/2020 1154   CREATININE 2.05 (H) 10/02/2020 0429   CALCIUM 8.7 (L) 10/04/2020 0422   CALCIUM 8.6 (L) 10/03/2020 1154   CALCIUM 9.0 10/02/2020 0429   GFRNONAA 29 (L) 10/04/2020 0422   GFRNONAA 30 (L) 10/03/2020 1154   GFRNONAA 31 (L) 10/02/2020 0429   GFRAA 39 (L) 09/22/2020 0604   GFRAA 41 (L) 09/20/2020 0438   GFRAA 42 (L) 09/18/2020 0828   CBC    Component Value Date/Time   WBC 8.1  10/02/2020 0429   RBC 2.75 (L) 10/02/2020 0429   HGB 8.2 (L) 10/02/2020 0429   HCT 28.2 (L) 10/02/2020 0429   PLT 168 10/02/2020 0429   MCV 102.5 (H) 10/02/2020 0429   MCH 29.8 10/02/2020 0429   MCHC 29.1 (L) 10/02/2020 0429   RDW 23.9 (H) 10/02/2020 0429   HEPATIC Function Panel Recent Labs    09/01/20 0955  PROT 6.4*   HEMOGLOBIN A1C No components found for: HGA1C,  MPG CARDIAC ENZYMES No results found for: CKTOTAL, CKMB, CKMBINDEX, TROPONINI BNP No results for input(s): PROBNP in the last 8760 hours. TSH No results for input(s): TSH in the last 8760 hours. CHOLESTEROL No results for input(s): CHOL in the last 8760 hours.  Scheduled Meds: Continuous Infusions: PRN Meds:.  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   LOS: 0 days   Time spent including chart review, lab review, examination, discussion with patient and nurse : 30 min   Orpah Cobb  MD  10/04/2020, 12:24 PM

## 2020-10-05 ENCOUNTER — Other Ambulatory Visit (HOSPITAL_COMMUNITY): Payer: Medicare Other

## 2020-10-05 DIAGNOSIS — I5042 Chronic combined systolic (congestive) and diastolic (congestive) heart failure: Secondary | ICD-10-CM | POA: Diagnosis not present

## 2020-10-05 DIAGNOSIS — J9621 Acute and chronic respiratory failure with hypoxia: Secondary | ICD-10-CM | POA: Diagnosis not present

## 2020-10-05 DIAGNOSIS — I482 Chronic atrial fibrillation, unspecified: Secondary | ICD-10-CM | POA: Diagnosis not present

## 2020-10-05 DIAGNOSIS — J449 Chronic obstructive pulmonary disease, unspecified: Secondary | ICD-10-CM | POA: Diagnosis not present

## 2020-10-05 LAB — BASIC METABOLIC PANEL
Anion gap: 14 (ref 5–15)
Anion gap: 11 (ref 5–15)
BUN: 135 mg/dL — ABNORMAL HIGH (ref 8–23)
BUN: 148 mg/dL — ABNORMAL HIGH (ref 8–23)
CO2: 28 mmol/L (ref 22–32)
CO2: 31 mmol/L (ref 22–32)
Calcium: 8.6 mg/dL — ABNORMAL LOW (ref 8.9–10.3)
Calcium: 8.4 mg/dL — ABNORMAL LOW (ref 8.9–10.3)
Chloride: 101 mmol/L (ref 98–111)
Chloride: 98 mmol/L (ref 98–111)
Creatinine, Ser: 2.14 mg/dL — ABNORMAL HIGH (ref 0.61–1.24)
Creatinine, Ser: 2.45 mg/dL — ABNORMAL HIGH (ref 0.61–1.24)
GFR, Estimated: 29 mL/min — ABNORMAL LOW (ref >60)
GFR, Estimated: 25 mL/min — ABNORMAL LOW (ref >60)
Glucose, Bld: 227 mg/dL — ABNORMAL HIGH (ref 70–99)
Glucose, Bld: 233 mg/dL — ABNORMAL HIGH (ref 70–99)
Potassium: 5.8 mmol/L — ABNORMAL HIGH (ref 3.5–5.1)
Potassium: 4.5 mmol/L (ref 3.5–5.1)
Sodium: 143 mmol/L (ref 135–145)
Sodium: 140 mmol/L (ref 135–145)

## 2020-10-05 LAB — BLOOD GAS, ARTERIAL
Acid-Base Excess: 6.6 mmol/L — ABNORMAL HIGH (ref 0.0–2.0)
Bicarbonate: 32.2 mmol/L — ABNORMAL HIGH (ref 20.0–28.0)
FIO2: 60
O2 Saturation: 95 %
Patient temperature: 35.9
pCO2 arterial: 57.8 mmHg — ABNORMAL HIGH (ref 32.0–48.0)
pH, Arterial: 7.358 (ref 7.350–7.450)
pO2, Arterial: 70 mmHg — ABNORMAL LOW (ref 83.0–108.0)

## 2020-10-05 LAB — MAGNESIUM: Magnesium: 2.5 mg/dL — ABNORMAL HIGH (ref 1.7–2.4)

## 2020-10-05 LAB — CBC
HCT: 30 % — ABNORMAL LOW (ref 39.0–52.0)
Hemoglobin: 8.7 g/dL — ABNORMAL LOW (ref 13.0–17.0)
MCH: 30.1 pg (ref 26.0–34.0)
MCHC: 29 g/dL — ABNORMAL LOW (ref 30.0–36.0)
MCV: 103.8 fL — ABNORMAL HIGH (ref 80.0–100.0)
Platelets: 181 10*3/uL (ref 150–400)
RBC: 2.89 MIL/uL — ABNORMAL LOW (ref 4.22–5.81)
RDW: 23 % — ABNORMAL HIGH (ref 11.5–15.5)
WBC: 18.2 10*3/uL — ABNORMAL HIGH (ref 4.0–10.5)
nRBC: 0.1 % (ref 0.0–0.2)

## 2020-10-05 LAB — TROPONIN I (HIGH SENSITIVITY): Troponin I (High Sensitivity): 40 ng/L — ABNORMAL HIGH (ref <18)

## 2020-10-05 LAB — BRAIN NATRIURETIC PEPTIDE: B Natriuretic Peptide: 637.5 pg/mL — ABNORMAL HIGH (ref 0.0–100.0)

## 2020-10-05 NOTE — Progress Notes (Signed)
Pulmonary Critical Care Medicine [facility] [place]   PULMONARY CRITICAL CARE SERVICE  PROGRESS NOTE  Date of Service: 10/05/2020  Eric Knapp  DQQ:[id number]  DOB: 10/21/1941   DOA: 08/28/2020  Referring Physician: Carron Curie, MD  HPI: Eric Knapp is a 79 y.o. male seen for follow up of Acute on Chronic Respiratory Failure. Apparently the patient had a CODE BLUE or near code event.  Right now is on pressure support on 60% FiO2.  Medications: Reviewed on Rounds  Physical Exam:  Vitals: Temperature is 98.8 pulse 107 respiratory 18 blood pressure is 138/69 saturations 93%  Ventilator Settings currently on pressure support FiO2 60% pressure 12/5  . General: Comfortable at this time . Eyes: Grossly normal lids, irises & conjunctiva . ENT: grossly tongue is normal . Neck: no obvious mass . Cardiovascular: S1 S2 normal no gallop . Respiratory: Scattered rhonchi expansion is equal at this time . Abdomen: soft . Skin: no rash seen on limited exam . Musculoskeletal: not rigid . Psychiatric:unable to assess . Neurologic: no seizure no involuntary movements         Lab Data:   Basic Metabolic Panel: Recent Labs  Lab 09/30/20 0552 10/02/20 0429 10/03/20 1154 10/04/20 0422 10/05/20 0056  NA 140 146* 145 147* 143  K 4.4 5.0 5.2* 4.8 5.8*  CL 98 102 101 102 101  CO2 33* 33* 35* 35* 28  GLUCOSE 218* 118* 174* 148* 227*  BUN 112* 126* 124* 127* 135*  CREATININE 1.94* 2.05* 2.07* 2.15* 2.14*  CALCIUM 8.6* 9.0 8.6* 8.7* 8.6*  MG  --   --  2.3  --  2.5*    ABG: Recent Labs  Lab 10/05/20 0130  PHART 7.358  PCO2ART 57.8*  PO2ART 70.0*  HCO3 32.2*  O2SAT 95.0    Liver Function Tests: No results for input(s): AST, ALT, ALKPHOS, BILITOT, PROT, ALBUMIN in the last 168 hours. No results for input(s): LIPASE, AMYLASE in the last 168 hours. No results for input(s): AMMONIA in the last 168 hours.  CBC: Recent Labs  Lab 09/30/20 0552 10/02/20 0429  10/05/20 0056  WBC 7.8 8.1 18.2*  HGB 7.6* 8.2* 8.7*  HCT 26.8* 28.2* 30.0*  MCV 101.9* 102.5* 103.8*  PLT 187 168 181    Cardiac Enzymes: No results for input(s): CKTOTAL, CKMB, CKMBINDEX, TROPONINI in the last 168 hours.  BNP (last 3 results) Recent Labs    09/04/20 1646 09/05/20 1007  BNP 389.9* 458.9*    ProBNP (last 3 results) No results for input(s): PROBNP in the last 8760 hours.  Radiological Exams: DG Chest Port 1 View  Result Date: 10/05/2020 CLINICAL DATA:  79 year old male with respiratory failure. EXAM: PORTABLE CHEST 1 VIEW COMPARISON:  Chest radiograph dated 10/04/2020. FINDINGS: Tracheostomy above the carina in similar position. There is mild cardiomegaly with vascular congestion and edema. Small right pleural effusion similar to prior radiograph. No pneumothorax. No acute osseous pathology. IMPRESSION: Cardiomegaly with findings of CHF and small right pleural effusion. No significant interval change. Electronically Signed   By: Elgie Collard M.D.   On: 10/05/2020 00:54   DG CHEST PORT 1 VIEW  Result Date: 10/04/2020 CLINICAL DATA:  Pleural effusion EXAM: PORTABLE CHEST 1 VIEW COMPARISON:  09/29/2020 FINDINGS: Layering moderate right and small left pleural effusions. Mild right basilar opacities, likely atelectasis. When compared to the prior, the left pleural effusion is improved. No pneumothorax. Tracheostomy in satisfactory position. Mild cardiomegaly. IMPRESSION: Layering moderate right and small left pleural effusions, decreased on the  left. Mild right basilar opacities, likely atelectasis. Electronically Signed   By: Charline Bills M.D.   On: 10/04/2020 06:20    Assessment/Plan Active Problems:   Acute on chronic respiratory failure with hypoxia (HCC)   Chronic combined systolic and diastolic heart failure (HCC)   COPD, severe (HCC)   Chronic atrial fibrillation (HCC)   Metabolic encephalopathy   Pleural effusion, right   1. Acute on chronic  respiratory failure with hypoxia we'll continue with pressure support 12/5 liters tolerated. 2. Chronic combined systolic diastolic heart failure right now appears compensated.  Patient did have a moderate degree of right-sided pleural effusion may need assessment for possibility thoracentesis. 3. Chronic atrial fibrillation rate is controlled 4. Metabolic encephalopathy no change we'll continue to follow 5. Pleural effusion as mentioned may need thoracentesis assessment but the latest chest x-ray shows improvement   I have personally seen and evaluated the patient, evaluated laboratory and imaging results, formulated the assessment and plan and placed orders. The Patient requires high complexity decision making with multiple systems involvement.  Rounds were done with the Respiratory Therapy Director and Staff therapists and discussed with nursing staff also.  Yevonne Pax, MD [facility] Pulmonary Critical Care Medicine Sleep Medicine

## 2020-10-05 NOTE — Consult Note (Signed)
Ref: Carron Curie, MD  Subjective:  Episode of bradycardia and hypotension. Needed dopamine drip discontinued after brief duration in the night.    Monitor shows atrial fibrillation with controlled ventricular response. BP in 80's/50's. Beta blocker and  Furosemide on hold.  Objective:  Vital Signs in the last 24 hours:  P: 80, R: 24, BP: 84/56. O2 sat 94 %.  Physical Exam: BP Readings from Last 1 Encounters:  09/07/20 (!) 92/39     Wt Readings from Last 1 Encounters:  No data found for Wt    Weight change:  There is no height or weight on file to calculate BMI. HEENT: [place]/AT, Eyes-Blue, Conjunctiva-Pale, Sclera-Non-icteric Neck: No JVD, No bruit, Tracheostomy present. Lungs:  Clearing, Bilateral. Cardiac:  Regular rhythm, normal S1 and S2, no S3. II/VI systolic murmur. Abdomen:  Soft. BS present. Extremities:  1 + edema present. No cyanosis. No clubbing. CNS: AxOx0, Cranial nerves grossly intact.  Skin: Warm and dry.   Intake/Output from previous day: No intake/output data recorded.    Lab Results: BMET    Component Value Date/Time   NA 140 10/05/2020 1824   NA 143 10/05/2020 0056   NA 147 (H) 10/04/2020 0422   K 4.5 10/05/2020 1824   K 5.8 (H) 10/05/2020 0056   K 4.8 10/04/2020 0422   CL 98 10/05/2020 1824   CL 101 10/05/2020 0056   CL 102 10/04/2020 0422   CO2 31 10/05/2020 1824   CO2 28 10/05/2020 0056   CO2 35 (H) 10/04/2020 0422   GLUCOSE 233 (H) 10/05/2020 1824   GLUCOSE 227 (H) 10/05/2020 0056   GLUCOSE 148 (H) 10/04/2020 0422   BUN 148 (H) 10/05/2020 1824   BUN 135 (H) 10/05/2020 0056   BUN 127 (H) 10/04/2020 0422   CREATININE 2.45 (H) 10/05/2020 1824   CREATININE 2.14 (H) 10/05/2020 0056   CREATININE 2.15 (H) 10/04/2020 0422   CALCIUM 8.4 (L) 10/05/2020 1824   CALCIUM 8.6 (L) 10/05/2020 0056   CALCIUM 8.7 (L) 10/04/2020 0422   GFRNONAA 25 (L) 10/05/2020 1824   GFRNONAA 29 (L) 10/05/2020 0056   GFRNONAA 29 (L) 10/04/2020 0422   GFRAA 39 (L)  09/22/2020 0604   GFRAA 41 (L) 09/20/2020 0438   GFRAA 42 (L) 09/18/2020 0828   CBC    Component Value Date/Time   WBC 18.2 (H) 10/05/2020 0056   RBC 2.89 (L) 10/05/2020 0056   HGB 8.7 (L) 10/05/2020 0056   HCT 30.0 (L) 10/05/2020 0056   PLT 181 10/05/2020 0056   MCV 103.8 (H) 10/05/2020 0056   MCH 30.1 10/05/2020 0056   MCHC 29.0 (L) 10/05/2020 0056   RDW 23.0 (H) 10/05/2020 0056   HEPATIC Function Panel Recent Labs    09/01/20 0955  PROT 6.4*   HEMOGLOBIN A1C No components found for: HGA1C,  MPG CARDIAC ENZYMES No results found for: CKTOTAL, CKMB, CKMBINDEX, TROPONINI BNP No results for input(s): PROBNP in the last 8760 hours. TSH No results for input(s): TSH in the last 8760 hours. CHOLESTEROL No results for input(s): CHOL in the last 8760 hours.  Scheduled Meds: Continuous Infusions: PRN Meds:.  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 Non-sustained VT COPD Chronic atrial fibrillation Type 2 DM Metabolic encephalopathy S/P right pleural effusion  Agree with holding furosemide and beta blocker if HR less than 60 or BP less than 100/60.   LOS: 0 days   Time spent including chart review, lab review, examination,  discussion with patient's nurse and physician : 30 min   Orpah Cobb  MD  10/05/2020, 9:10 PM

## 2020-10-06 DIAGNOSIS — I482 Chronic atrial fibrillation, unspecified: Secondary | ICD-10-CM | POA: Diagnosis not present

## 2020-10-06 DIAGNOSIS — J449 Chronic obstructive pulmonary disease, unspecified: Secondary | ICD-10-CM | POA: Diagnosis not present

## 2020-10-06 DIAGNOSIS — I5042 Chronic combined systolic (congestive) and diastolic (congestive) heart failure: Secondary | ICD-10-CM | POA: Diagnosis not present

## 2020-10-06 DIAGNOSIS — J9621 Acute and chronic respiratory failure with hypoxia: Secondary | ICD-10-CM | POA: Diagnosis not present

## 2020-10-06 LAB — PREPARE RBC (CROSSMATCH)

## 2020-10-06 LAB — MAGNESIUM: Magnesium: 2.2 mg/dL (ref 1.7–2.4)

## 2020-10-06 LAB — CBC
HCT: 24.5 % — ABNORMAL LOW (ref 39.0–52.0)
HCT: 23.1 % — ABNORMAL LOW (ref 39.0–52.0)
Hemoglobin: 6.8 g/dL — CL (ref 13.0–17.0)
Hemoglobin: 6.7 g/dL — CL (ref 13.0–17.0)
MCH: 28.5 pg (ref 26.0–34.0)
MCH: 29.4 pg (ref 26.0–34.0)
MCHC: 27.8 g/dL — ABNORMAL LOW (ref 30.0–36.0)
MCHC: 29 g/dL — ABNORMAL LOW (ref 30.0–36.0)
MCV: 102.5 fL — ABNORMAL HIGH (ref 80.0–100.0)
MCV: 101.3 fL — ABNORMAL HIGH (ref 80.0–100.0)
Platelets: UNDETERMINED 10*3/uL (ref 150–400)
Platelets: 112 10*3/uL — ABNORMAL LOW (ref 150–400)
RBC: 2.39 MIL/uL — ABNORMAL LOW (ref 4.22–5.81)
RBC: 2.28 MIL/uL — ABNORMAL LOW (ref 4.22–5.81)
RDW: 22.3 % — ABNORMAL HIGH (ref 11.5–15.5)
RDW: 22 % — ABNORMAL HIGH (ref 11.5–15.5)
WBC: 5.9 10*3/uL (ref 4.0–10.5)
WBC: 6.6 10*3/uL (ref 4.0–10.5)
nRBC: 0 % (ref 0.0–0.2)
nRBC: 0 % (ref 0.0–0.2)

## 2020-10-06 LAB — BASIC METABOLIC PANEL
Anion gap: 10 (ref 5–15)
BUN: 146 mg/dL — ABNORMAL HIGH (ref 8–23)
CO2: 33 mmol/L — ABNORMAL HIGH (ref 22–32)
Calcium: 8.3 mg/dL — ABNORMAL LOW (ref 8.9–10.3)
Chloride: 98 mmol/L (ref 98–111)
Creatinine, Ser: 2.35 mg/dL — ABNORMAL HIGH (ref 0.61–1.24)
GFR, Estimated: 26 mL/min — ABNORMAL LOW (ref >60)
Glucose, Bld: 263 mg/dL — ABNORMAL HIGH (ref 70–99)
Potassium: 4.2 mmol/L (ref 3.5–5.1)
Sodium: 141 mmol/L (ref 135–145)

## 2020-10-06 NOTE — Progress Notes (Addendum)
Pulmonary Critical Care Medicine [facility] [place]   PULMONARY CRITICAL CARE SERVICE  PROGRESS NOTE  Date of Service: 10/06/2020  Eric Knapp  SLH:[id number]  DOB: Jun 20, 1941   DOA: 08/22/2020  Referring Physician: Carron Curie, MD  HPI: Eric Knapp is a 79 y.o. male seen for follow up of Acute on Chronic Respiratory Failure.  Patient continues on pressure support at this time currently requiring 50% FiO2 satting well.  Medications: Reviewed on Rounds  Physical Exam:  Vitals: Pulse 89 respirations 24 BP 113/61 O2 sat 100% temp 95.7  Ventilator Settings pressure support 12/5 FiO2 50%  . General: Comfortable at this time . Eyes: Grossly normal lids, irises & conjunctiva . ENT: grossly tongue is normal . Neck: no obvious mass . Cardiovascular: S1 S2 normal no gallop . Respiratory: Coarse breath sounds . Abdomen: soft . Skin: no rash seen on limited exam . Musculoskeletal: not rigid . Psychiatric:unable to assess . Neurologic: no seizure no involuntary movements         Lab Data:   Basic Metabolic Panel: Recent Labs  Lab 10/03/20 1154 10/04/20 0422 10/05/20 0056 10/05/20 1824 10/06/20 0502  NA 145 147* 143 140 141  K 5.2* 4.8 5.8* 4.5 4.2  CL 101 102 101 98 98  CO2 35* 35* 28 31 33*  GLUCOSE 174* 148* 227* 233* 263*  BUN 124* 127* 135* 148* 146*  CREATININE 2.07* 2.15* 2.14* 2.45* 2.35*  CALCIUM 8.6* 8.7* 8.6* 8.4* 8.3*  MG 2.3  --  2.5*  --  2.2    ABG: Recent Labs  Lab 10/05/20 0130  PHART 7.358  PCO2ART 57.8*  PO2ART 70.0*  HCO3 32.2*  O2SAT 95.0    Liver Function Tests: No results for input(s): AST, ALT, ALKPHOS, BILITOT, PROT, ALBUMIN in the last 168 hours. No results for input(s): LIPASE, AMYLASE in the last 168 hours. No results for input(s): AMMONIA in the last 168 hours.  CBC: Recent Labs  Lab 09/30/20 0552 10/02/20 0429 10/05/20 0056 10/06/20 0502  WBC 7.8 8.1 18.2* 5.9  HGB 7.6* 8.2* 8.7* 6.8*  HCT 26.8* 28.2* 30.0*  24.5*  MCV 101.9* 102.5* 103.8* 102.5*  PLT 187 168 181 PLATELET CLUMPS NOTED ON SMEAR, UNABLE TO ESTIMATE    Cardiac Enzymes: No results for input(s): CKTOTAL, CKMB, CKMBINDEX, TROPONINI in the last 168 hours.  BNP (last 3 results) Recent Labs    09/04/20 1646 09/05/20 1007 10/05/20 1046  BNP 389.9* 458.9* 637.5*    ProBNP (last 3 results) No results for input(s): PROBNP in the last 8760 hours.  Radiological Exams: US RENAL  Result Date: 10/05/2020 CLINICAL DATA:  Acute renal injury EXAM: RENAL / URINARY TRACT ULTRASOUND COMPLETE COMPARISON:  CT from 09/25/2020 FINDINGS: Right Kidney: Renal measurements: 9.3 x 3.8 x 4.6 cm. = volume: 85 mL. Echogenicity within normal limits. No mass or hydronephrosis visualized. Left Kidney: Renal measurements: 9.4 x 4.7 x 4.3 cm. = volume: 95 mL. Echogenicity within normal limits. No mass or hydronephrosis visualized. Bladder: Decompressed by Foley catheter. Other: None. IMPRESSION: Small kidneys bilaterally. No obstructive changes are seen. No other focal abnormality is noted. Electronically Signed   By: Alcide Clever M.D.   On: 10/05/2020 18:09   DG Chest Port 1 View  Result Date: 10/05/2020 CLINICAL DATA:  79 year old male with respiratory failure. EXAM: PORTABLE CHEST 1 VIEW COMPARISON:  Chest radiograph dated 10/04/2020. FINDINGS: Tracheostomy above the carina in similar position. There is mild cardiomegaly with vascular congestion and edema. Small right pleural effusion similar  to prior radiograph. No pneumothorax. No acute osseous pathology. IMPRESSION: Cardiomegaly with findings of CHF and small right pleural effusion. No significant interval change. Electronically Signed   By: Elgie Collard M.D.   On: 10/05/2020 00:54    Assessment/Plan Active Problems:   Acute on chronic respiratory failure with hypoxia (HCC)   Chronic combined systolic and diastolic heart failure (HCC)   COPD, severe (HCC)   Chronic atrial fibrillation (HCC)    Metabolic encephalopathy   Pleural effusion, right   1. Acute on chronic respiratory failure with hypoxia we will continue on pressure support at this time currently 12/5 and FiO2 50% we will continue aggressive pulmonary toilet supportive measures.. 2. Chronic combined systolic diastolic heart failure right now appears compensated.  Patient did have a moderate degree of right-sided pleural effusion may need assessment for possibility thoracentesis. 3. Chronic atrial fibrillation rate is controlled 4. Metabolic encephalopathy no change we'll continue to follow 5. Pleural effusion as mentioned continue to monitor.   I have personally seen and evaluated the patient, evaluated laboratory and imaging results, formulated the assessment and plan and placed orders. The Patient requires high complexity decision making with multiple systems involvement.  Rounds were done with the Respiratory Therapy Director and Staff therapists and discussed with nursing staff also.  Yevonne Pax, MD [facility] Pulmonary Critical Care Medicine Sleep Medicine

## 2020-10-07 DIAGNOSIS — J449 Chronic obstructive pulmonary disease, unspecified: Secondary | ICD-10-CM | POA: Diagnosis not present

## 2020-10-07 DIAGNOSIS — I5042 Chronic combined systolic (congestive) and diastolic (congestive) heart failure: Secondary | ICD-10-CM | POA: Diagnosis not present

## 2020-10-07 DIAGNOSIS — I482 Chronic atrial fibrillation, unspecified: Secondary | ICD-10-CM | POA: Diagnosis not present

## 2020-10-07 DIAGNOSIS — J9621 Acute and chronic respiratory failure with hypoxia: Secondary | ICD-10-CM | POA: Diagnosis not present

## 2020-10-07 LAB — BASIC METABOLIC PANEL
Anion gap: 12 (ref 5–15)
BUN: 137 mg/dL — ABNORMAL HIGH (ref 8–23)
CO2: 30 mmol/L (ref 22–32)
Calcium: 8.3 mg/dL — ABNORMAL LOW (ref 8.9–10.3)
Chloride: 97 mmol/L — ABNORMAL LOW (ref 98–111)
Creatinine, Ser: 2.13 mg/dL — ABNORMAL HIGH (ref 0.61–1.24)
GFR, Estimated: 29 mL/min — ABNORMAL LOW (ref >60)
Glucose, Bld: 139 mg/dL — ABNORMAL HIGH (ref 70–99)
Potassium: 4.5 mmol/L (ref 3.5–5.1)
Sodium: 139 mmol/L (ref 135–145)

## 2020-10-07 LAB — CBC
HCT: 27.4 % — ABNORMAL LOW (ref 39.0–52.0)
Hemoglobin: 8.1 g/dL — ABNORMAL LOW (ref 13.0–17.0)
MCH: 29 pg (ref 26.0–34.0)
MCHC: 29.6 g/dL — ABNORMAL LOW (ref 30.0–36.0)
MCV: 98.2 fL (ref 80.0–100.0)
Platelets: 115 10*3/uL — ABNORMAL LOW (ref 150–400)
RBC: 2.79 MIL/uL — ABNORMAL LOW (ref 4.22–5.81)
RDW: 21.2 % — ABNORMAL HIGH (ref 11.5–15.5)
WBC: 6.3 10*3/uL (ref 4.0–10.5)
nRBC: 0 % (ref 0.0–0.2)

## 2020-10-07 LAB — TYPE AND SCREEN
ABO/RH(D): O POS
Antibody Screen: NEGATIVE
Unit division: 0

## 2020-10-07 LAB — BPAM RBC
Blood Product Expiration Date: 202111212359
ISSUE DATE / TIME: 202110191630
Unit Type and Rh: 5100

## 2020-10-07 NOTE — Progress Notes (Signed)
[facility] [place] [facility]  ROUNDING NOTE   Subjective:  Asked to see the patient back in follow-up. BUN currently 137 with a creatinine of 2.13 which is better as compared to several days ago. Still on the ventilator. Also anemic with most recent hemoglobin of 8.1 posttransfusion.  Objective:  Vital signs in last 24 hours:  Temperature 96.6 pulse 86 respirations 26 blood pressure 129/58  Physical Exam: General:  Critically ill-appearing  Head:  Normocephalic, atraumatic.   Eyes:  Anicteric  Neck:  Tracheostomy in place  Lungs:   Bilateral rales and rhonchi, vent assisted  Heart:  S1S2 no rubs  Abdomen:   Soft, nontender, bowel sounds present, PEG tube in place  Extremities:  2+ peripheral edema.  Neurologic:  Arousable, not consistently following commands  Skin:  No lesions  GU:  Foley catheter in place    Basic Metabolic Panel: Recent Labs  Lab 10/03/20 1154 10/03/20 1154 10/04/20 0422 10/04/20 0422 10/05/20 0056 10/05/20 0056 10/05/20 1824 10/06/20 0502 10/07/20 0348  NA 145   < > 147*  --  143  --  140 141 139  K 5.2*   < > 4.8  --  5.8*  --  4.5 4.2 4.5  CL 101   < > 102  --  101  --  98 98 97*  CO2 35*   < > 35*  --  28  --  31 33* 30  GLUCOSE 174*   < > 148*  --  227*  --  233* 263* 139*  BUN 124*   < > 127*  --  135*  --  148* 146* 137*  CREATININE 2.07*   < > 2.15*  --  2.14*  --  2.45* 2.35* 2.13*  CALCIUM 8.6*   < > 8.7*   < > 8.6*   < > 8.4* 8.3* 8.3*  MG 2.3  --   --   --  2.5*  --   --  2.2  --    < > = values in this interval not displayed.    Liver Function Tests: No results for input(s): AST, ALT, ALKPHOS, BILITOT, PROT, ALBUMIN in the last 168 hours. No results for input(s): LIPASE, AMYLASE in the last 168 hours. No results for input(s): AMMONIA in the last 168 hours.  CBC: Recent Labs  Lab 10/02/20 0429 10/05/20 0056 10/06/20 0502 10/06/20 1511 10/07/20 0348  WBC 8.1 18.2* 5.9 6.6 6.3  HGB 8.2* 8.7* 6.8* 6.7* 8.1*  HCT 28.2* 30.0*  24.5* 23.1* 27.4*  MCV 102.5* 103.8* 102.5* 101.3* 98.2  PLT 168 181 PLATELET CLUMPS NOTED ON SMEAR, UNABLE TO ESTIMATE 112* 115*    Cardiac Enzymes: No results for input(s): CKTOTAL, CKMB, CKMBINDEX, TROPONINI in the last 168 hours.  BNP: Invalid input(s): POCBNP  CBG: No results for input(s): GLUCAP in the last 168 hours.  Microbiology: Results for orders placed or performed during the hospital encounter of 09/06/2020  SARS CORONAVIRUS 2 (TAT 6-24 HRS) Nasopharyngeal Nasopharyngeal Swab     Status: None   Collection Time: 09/01/20  1:19 PM   Specimen: Nasopharyngeal Swab  Result Value Ref Range Status   SARS Coronavirus 2 NEGATIVE NEGATIVE Final    Comment: (NOTE) SARS-CoV-2 target nucleic acids are NOT DETECTED.  The SARS-CoV-2 RNA is generally detectable in upper and lower respiratory specimens during the acute phase of infection. Negative results do not preclude SARS-CoV-2 infection, do not rule out co-infections with other pathogens, and should not be used as the sole  basis for treatment or other patient management decisions. Negative results must be combined with clinical observations, patient history, and epidemiological information. The expected result is Negative.  Fact Sheet for Patients: HairSlick.no  Fact Sheet for Healthcare Providers: quierodirigir.com  This test is not yet approved or cleared by the [place] FDA and  has been authorized for detection and/or diagnosis of SARS-CoV-2 by FDA under an Emergency Use Authorization (EUA). This EUA will remain  in effect (meaning this test can be used) for the duration of the COVID-19 declaration under Se ction 564(b)(1) of the Act, 21 U.S.C. section 360bbb-3(b)(1), unless the authorization is terminated or revoked sooner.  Performed at [facility], [address] [place], [place] [address]   Culture, Urine     Status: Abnormal   Collection Time:  09/05/20 12:41 PM   Specimen: Urine, Random  Result Value Ref Range Status   Specimen Description URINE, RANDOM  Final   Special Requests NONE  Final   Culture (A)  Final    <10,000 COLONIES/mL INSIGNIFICANT GROWTH Performed at [facility], [address], [place], [place] [address]    Report Status 09/06/2020 FINAL  Final  Culture, blood (routine x 2)     Status: None   Collection Time: 09/05/20  4:16 PM   Specimen: BLOOD  Result Value Ref Range Status   Specimen Description BLOOD RIGHT ANTECUBITAL  Final   Special Requests   Final    BOTTLES DRAWN AEROBIC AND ANAEROBIC Blood Culture adequate volume   Culture   Final    NO GROWTH 5 DAYS Performed at [facility], [address], [place], [place] [address]    Report Status 09/10/2020 FINAL  Final  Culture, blood (routine x 2)     Status: None   Collection Time: 09/05/20  4:16 PM   Specimen: BLOOD LEFT HAND  Result Value Ref Range Status   Specimen Description BLOOD LEFT HAND  Final   Special Requests   Final    BOTTLES DRAWN AEROBIC AND ANAEROBIC Blood Culture adequate volume   Culture   Final    NO GROWTH 5 DAYS Performed at [facility], [address] [place], [place] [address]    Report Status 09/10/2020 FINAL  Final  Aerobic/Anaerobic Culture (surgical/deep wound)     Status: None   Collection Time: 09/07/20  4:09 PM   Specimen: Pleural Fluid  Result Value Ref Range Status   Specimen Description FLUID RIGHT PLEURAL  Final   Special Requests Normal  Final   Gram Stain   Final    MODERATE WBC PRESENT,BOTH PMN AND MONONUCLEAR NO ORGANISMS SEEN    Culture   Final    No growth aerobically or anaerobically. Performed at [facility], [address], [place], [place] [address]    Report Status 09/14/2020 FINAL  Final    Coagulation Studies: No results for input(s): LABPROT, INR in the last 72 hours.  Urinalysis: No results for input(s): COLORURINE, LABSPEC, PHURINE, GLUCOSEU, HGBUR,  BILIRUBINUR, KETONESUR, PROTEINUR, UROBILINOGEN, NITRITE, LEUKOCYTESUR in the last 72 hours.  Invalid input(s): APPERANCEUR    Imaging: US RENAL  Result Date: 10/05/2020 CLINICAL DATA:  Acute renal injury EXAM: RENAL / URINARY TRACT ULTRASOUND COMPLETE COMPARISON:  CT from 09/25/2020 FINDINGS: Right Kidney: Renal measurements: 9.3 x 3.8 x 4.6 cm. = volume: 85 mL. Echogenicity within normal limits. No mass or hydronephrosis visualized. Left Kidney: Renal measurements: 9.4 x 4.7 x 4.3 cm. =  volume: 95 mL. Echogenicity within normal limits. No mass or hydronephrosis visualized. Bladder: Decompressed by Foley catheter. Other: None. IMPRESSION: Small kidneys bilaterally. No obstructive changes are seen. No other focal abnormality is noted. Electronically Signed   By: Inez Catalina M.D.   On: 10/05/2020 18:09     Medications:       Assessment/ Plan:  79 y.o. male with a PMHx of hyperlipidemia, hypertension, coronary artery disease, chronic diastolic heart failure, COPD, diabetes mellitus type 2, who was admitted to [facility] on 08/23/2020 for ongoing treatment of acute respiratory failure.  1.  Acute kidney injury/chronic kidney disease stage IIIB baseline EGFR 38.  Suspect acute kidney injury now related to concurrent illness and dehydration.   -The patient has had varying levels of azotemia recently. BUN down to 137 with a creatinine of 2.13. There may be some intravascular volume depletion. Agree with IV fluid hydration at 50 cc/h. Maintain the patient on low concentrated tube feeds in the form of Osmolite. Previously on Nepro his BUN went up considerably. Patient making good urine at 1.5 L over the preceding 24 hours. No immediate need for dialysis.  2.  Anemia of chronic kidney disease. Hemoglobin up to 8.1 posttransfusion. Continue to monitor CBC.  3.  Hypernatremia. Was an issue earlier in the hospitalization but now resolved..   LOS: 0 Eric Knapp 10/20/202110:53 AM

## 2020-10-07 NOTE — Progress Notes (Addendum)
Pulmonary Critical Care Medicine [facility] [place]   PULMONARY CRITICAL CARE SERVICE  PROGRESS NOTE  Date of Service: 10/07/2020  Eric Knapp  XBJ:[id number]  DOB: [birth year]   DOA: 09/07/2020  Referring Physician: Carron Curie, MD  HPI: Eric Knapp is a 79 y.o. male seen for follow up of Acute on Chronic Respiratory Failure.  Patient remains on pressure support at this time with an FiO2 of 40% satting well.  Medications: Reviewed on Rounds  Physical Exam:  Vitals: Pulse 86 respirations 26 BP 129/58 O2 sat 100% temp 96.6  Ventilator Settings pressure support 12/5 FiO2 40%  . General: Comfortable at this time . Eyes: Grossly normal lids, irises & conjunctiva . ENT: grossly tongue is normal . Neck: no obvious mass . Cardiovascular: S1 S2 normal no gallop . Respiratory: Coarse breath sounds . Abdomen: soft . Skin: no rash seen on limited exam . Musculoskeletal: not rigid . Psychiatric:unable to assess . Neurologic: no seizure no involuntary movements         Lab Data:   Basic Metabolic Panel: Recent Labs  Lab 10/03/20 1154 10/03/20 1154 10/04/20 0422 10/05/20 0056 10/05/20 1824 10/06/20 0502 10/07/20 0348  NA 145   < > 147* 143 140 141 139  K 5.2*   < > 4.8 5.8* 4.5 4.2 4.5  CL 101   < > 102 101 98 98 97*  CO2 35*   < > 35* 28 31 33* 30  GLUCOSE 174*   < > 148* 227* 233* 263* 139*  BUN 124*   < > 127* 135* 148* 146* 137*  CREATININE 2.07*   < > 2.15* 2.14* 2.45* 2.35* 2.13*  CALCIUM 8.6*   < > 8.7* 8.6* 8.4* 8.3* 8.3*  MG 2.3  --   --  2.5*  --  2.2  --    < > = values in this interval not displayed.    ABG: Recent Labs  Lab 10/05/20 0130  PHART 7.358  PCO2ART 57.8*  PO2ART 70.0*  HCO3 32.2*  O2SAT 95.0    Liver Function Tests: No results for input(s): AST, ALT, ALKPHOS, BILITOT, PROT, ALBUMIN in the last 168 hours. No results for input(s): LIPASE, AMYLASE in the last 168 hours. No results for input(s): AMMONIA in the last 168  hours.  CBC: Recent Labs  Lab 10/02/20 0429 10/05/20 0056 10/06/20 0502 10/06/20 1511 10/07/20 0348  WBC 8.1 18.2* 5.9 6.6 6.3  HGB 8.2* 8.7* 6.8* 6.7* 8.1*  HCT 28.2* 30.0* 24.5* 23.1* 27.4*  MCV 102.5* 103.8* 102.5* 101.3* 98.2  PLT 168 181 PLATELET CLUMPS NOTED ON SMEAR, UNABLE TO ESTIMATE 112* 115*    Cardiac Enzymes: No results for input(s): CKTOTAL, CKMB, CKMBINDEX, TROPONINI in the last 168 hours.  BNP (last 3 results) Recent Labs    09/04/20 1646 09/05/20 1007 10/05/20 1046  BNP 389.9* 458.9* 637.5*    ProBNP (last 3 results) No results for input(s): PROBNP in the last 8760 hours.  Radiological Exams: US RENAL  Result Date: 10/05/2020 CLINICAL DATA:  Acute renal injury EXAM: RENAL / URINARY TRACT ULTRASOUND COMPLETE COMPARISON:  CT from 09/25/2020 FINDINGS: Right Kidney: Renal measurements: 9.3 x 3.8 x 4.6 cm. = volume: 85 mL. Echogenicity within normal limits. No mass or hydronephrosis visualized. Left Kidney: Renal measurements: 9.4 x 4.7 x 4.3 cm. = volume: 95 mL. Echogenicity within normal limits. No mass or hydronephrosis visualized. Bladder: Decompressed by Foley catheter. Other: None. IMPRESSION: Small kidneys bilaterally. No obstructive changes are seen. No other focal  abnormality is noted. Electronically Signed   By: Alcide Clever M.D.   On: 10/05/2020 18:09    Assessment/Plan Active Problems:   Acute on chronic respiratory failure with hypoxia (HCC)   Chronic combined systolic and diastolic heart failure (HCC)   COPD, severe (HCC)   Chronic atrial fibrillation (HCC)   Metabolic encephalopathy   Pleural effusion, right   1. Acute on chronic respiratory failure with hypoxia patient will continue on pressure support at this time 40% FiO2.  We will continue supportive measures and aggressive pulmonary toilet continue to wean as tolerated per protocol. 2. Chronic combined systolic diastolic heart failure right now appears compensated. Patient did have a  moderate degree of right-sided pleural effusion may need assessment for possibility thoracentesis. 3. Chronic atrial fibrillation rate is controlled 4. Metabolic encephalopathy no change we'll continue to follow 5. Pleural effusion as mentioned continue to monitor.   I have personally seen and evaluated the patient, evaluated laboratory and imaging results, formulated the assessment and plan and placed orders. The Patient requires high complexity decision making with multiple systems involvement.  Rounds were done with the Respiratory Therapy Director and Staff therapists and discussed with nursing staff also.  Yevonne Pax, MD [facility] Pulmonary Critical Care Medicine Sleep Medicine

## 2020-10-07 NOTE — Consult Note (Signed)
Ref: Carron Curie, MD   Subjective:  Ventilator dependent. Anemia Got 1 unit of pRBC transfusion.  Objective:  Vital Signs in the last 24 hours:  P: 93, R: 20, BP: 120/80, O2 sat 98 %.  Physical Exam: BP Readings from Last 1 Encounters:  09/07/20 (!) 92/39     Wt Readings from Last 1 Encounters:  No data found for Wt    Weight change:  There is no height or weight on file to calculate BMI. HEENT: [place]/AT, Eyes-Blue, Conjunctiva-Pale, Sclera-Non-icteric Neck: No JVD, No bruit, Trachea midline. Lungs:  Clearing, Bilateral. Cardiac:  Irregular rhythm, normal S1 and S2, no S3. II/VI systolic murmur. Abdomen:  Soft, BS present. Extremities:  1 + edema present. No cyanosis. No clubbing. CNS: AxOx0, Cranial nerves grossly intact.  Skin: Warm and dry.   Intake/Output from previous day: No intake/output data recorded.    Lab Results: BMET    Component Value Date/Time   NA 139 10/07/2020 0348   NA 141 10/06/2020 0502   NA 140 10/05/2020 1824   K 4.5 10/07/2020 0348   K 4.2 10/06/2020 0502   K 4.5 10/05/2020 1824   CL 97 (L) 10/07/2020 0348   CL 98 10/06/2020 0502   CL 98 10/05/2020 1824   CO2 30 10/07/2020 0348   CO2 33 (H) 10/06/2020 0502   CO2 31 10/05/2020 1824   GLUCOSE 139 (H) 10/07/2020 0348   GLUCOSE 263 (H) 10/06/2020 0502   GLUCOSE 233 (H) 10/05/2020 1824   BUN 137 (H) 10/07/2020 0348   BUN 146 (H) 10/06/2020 0502   BUN 148 (H) 10/05/2020 1824   CREATININE 2.13 (H) 10/07/2020 0348   CREATININE 2.35 (H) 10/06/2020 0502   CREATININE 2.45 (H) 10/05/2020 1824   CALCIUM 8.3 (L) 10/07/2020 0348   CALCIUM 8.3 (L) 10/06/2020 0502   CALCIUM 8.4 (L) 10/05/2020 1824   GFRNONAA 29 (L) 10/07/2020 0348   GFRNONAA 26 (L) 10/06/2020 0502   GFRNONAA 25 (L) 10/05/2020 1824   GFRAA 39 (L) 09/22/2020 0604   GFRAA 41 (L) 09/20/2020 0438   GFRAA 42 (L) 09/18/2020 0828   CBC    Component Value Date/Time   WBC 6.3 10/07/2020 0348   RBC 2.79 (L) 10/07/2020 0348   HGB 8.1  (L) 10/07/2020 0348   HCT 27.4 (L) 10/07/2020 0348   PLT 115 (L) 10/07/2020 0348   MCV 98.2 10/07/2020 0348   MCH 29.0 10/07/2020 0348   MCHC 29.6 (L) 10/07/2020 0348   RDW 21.2 (H) 10/07/2020 0348   HEPATIC Function Panel Recent Labs    09/01/20 0955  PROT 6.4*   HEMOGLOBIN A1C No components found for: HGA1C,  MPG CARDIAC ENZYMES No results found for: CKTOTAL, CKMB, CKMBINDEX, TROPONINI BNP No results for input(s): PROBNP in the last 8760 hours. TSH No results for input(s): TSH in the last 8760 hours. CHOLESTEROL No results for input(s): CHOL in the last 8760 hours.  Scheduled Meds: Continuous Infusions: PRN Meds:.  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  Continue medical treatment.   LOS: 0 days   Time spent including chart review, lab review, examination, discussion with patient : 30  min   Orpah Cobb  MD  10/07/2020, 8:06 PM

## 2020-10-08 DIAGNOSIS — J9621 Acute and chronic respiratory failure with hypoxia: Secondary | ICD-10-CM | POA: Diagnosis not present

## 2020-10-08 DIAGNOSIS — J449 Chronic obstructive pulmonary disease, unspecified: Secondary | ICD-10-CM | POA: Diagnosis not present

## 2020-10-08 DIAGNOSIS — I482 Chronic atrial fibrillation, unspecified: Secondary | ICD-10-CM | POA: Diagnosis not present

## 2020-10-08 DIAGNOSIS — I5042 Chronic combined systolic (congestive) and diastolic (congestive) heart failure: Secondary | ICD-10-CM | POA: Diagnosis not present

## 2020-10-08 LAB — RENAL FUNCTION PANEL
Albumin: 1.7 g/dL — ABNORMAL LOW (ref 3.5–5.0)
Anion gap: 9 (ref 5–15)
BUN: 111 mg/dL — ABNORMAL HIGH (ref 8–23)
CO2: 33 mmol/L — ABNORMAL HIGH (ref 22–32)
Calcium: 8.5 mg/dL — ABNORMAL LOW (ref 8.9–10.3)
Chloride: 99 mmol/L (ref 98–111)
Creatinine, Ser: 1.91 mg/dL — ABNORMAL HIGH (ref 0.61–1.24)
GFR, Estimated: 33 mL/min — ABNORMAL LOW (ref >60)
Glucose, Bld: 156 mg/dL — ABNORMAL HIGH (ref 70–99)
Phosphorus: 3.4 mg/dL (ref 2.5–4.6)
Potassium: 4.2 mmol/L (ref 3.5–5.1)
Sodium: 141 mmol/L (ref 135–145)

## 2020-10-08 LAB — OCCULT BLOOD X 1 CARD TO LAB, STOOL: Fecal Occult Bld: NEGATIVE

## 2020-10-08 NOTE — Progress Notes (Signed)
Pulmonary Critical Care Medicine [facility] [place]   PULMONARY CRITICAL CARE SERVICE  PROGRESS NOTE  Date of Service: 10/08/2020  Eric Knapp  BTD:[id number]  DOB: [birth year]   DOA: 09-10-20  Referring Physician: Carron Curie, MD  HPI: Eric Knapp is a 79 y.o. male seen for follow up of Acute on Chronic Respiratory Failure.  Patient currently is on T collar has been on 35% FiO2 good saturations are noted at this time.  Medications: Reviewed on Rounds  Physical Exam:  Vitals: Temperature 96.9 pulse 85 respiratory rate is 18 blood pressure is 102/62 saturations 98%  Ventilator Settings on T collar with an FiO2 35%  . General: Comfortable at this time . Eyes: Grossly normal lids, irises & conjunctiva . ENT: grossly tongue is normal . Neck: no obvious mass . Cardiovascular: S1 S2 normal no gallop . Respiratory: Scattered rhonchi expansion is equal. . Abdomen: soft . Skin: no rash seen on limited exam . Musculoskeletal: not rigid . Psychiatric:unable to assess . Neurologic: no seizure no involuntary movements         Lab Data:   Basic Metabolic Panel: Recent Labs  Lab 10/03/20 1154 10/04/20 0422 10/05/20 0056 10/05/20 1824 10/06/20 0502 10/07/20 0348 10/08/20 0402  NA 145   < > 143 140 141 139 141  K 5.2*   < > 5.8* 4.5 4.2 4.5 4.2  CL 101   < > 101 98 98 97* 99  CO2 35*   < > 28 31 33* 30 33*  GLUCOSE 174*   < > 227* 233* 263* 139* 156*  BUN 124*   < > 135* 148* 146* 137* 111*  CREATININE 2.07*   < > 2.14* 2.45* 2.35* 2.13* 1.91*  CALCIUM 8.6*   < > 8.6* 8.4* 8.3* 8.3* 8.5*  MG 2.3  --  2.5*  --  2.2  --   --   PHOS  --   --   --   --   --   --  3.4   < > = values in this interval not displayed.    ABG: Recent Labs  Lab 10/05/20 0130  PHART 7.358  PCO2ART 57.8*  PO2ART 70.0*  HCO3 32.2*  O2SAT 95.0    Liver Function Tests: Recent Labs  Lab 10/08/20 0402  ALBUMIN 1.7*   No results for input(s): LIPASE, AMYLASE in the last 168  hours. No results for input(s): AMMONIA in the last 168 hours.  CBC: Recent Labs  Lab 10/02/20 0429 10/05/20 0056 10/06/20 0502 10/06/20 1511 10/07/20 0348  WBC 8.1 18.2* 5.9 6.6 6.3  HGB 8.2* 8.7* 6.8* 6.7* 8.1*  HCT 28.2* 30.0* 24.5* 23.1* 27.4*  MCV 102.5* 103.8* 102.5* 101.3* 98.2  PLT 168 181 PLATELET CLUMPS NOTED ON SMEAR, UNABLE TO ESTIMATE 112* 115*    Cardiac Enzymes: No results for input(s): CKTOTAL, CKMB, CKMBINDEX, TROPONINI in the last 168 hours.  BNP (last 3 results) Recent Labs    09/04/20 1646 09/05/20 1007 10/05/20 1046  BNP 389.9* 458.9* 637.5*    ProBNP (last 3 results) No results for input(s): PROBNP in the last 8760 hours.  Radiological Exams: No results found.  Assessment/Plan Active Problems:   Acute on chronic respiratory failure with hypoxia (HCC)   Chronic combined systolic and diastolic heart failure (HCC)   COPD, severe (HCC)   Chronic atrial fibrillation (HCC)   Metabolic encephalopathy   Pleural effusion, right   1. Acute on chronic respiratory failure with hypoxia the patient is continuing on T  collar trials currently on 35% FiO2. 2. Chronic combined systolic diastolic heart failure being seen by cardiology we will continue present management. 3. Severe COPD at baseline continue supportive care 4. Chronic atrial fibrillation rate is controlled 5. Right-sided pleural effusion no change continue present therapy   I have personally seen and evaluated the patient, evaluated laboratory and imaging results, formulated the assessment and plan and placed orders. The Patient requires high complexity decision making with multiple systems involvement.  Rounds were done with the Respiratory Therapy Director and Staff therapists and discussed with nursing staff also.  Yevonne Pax, MD [facility] Pulmonary Critical Care Medicine Sleep Medicine

## 2020-10-09 DIAGNOSIS — I482 Chronic atrial fibrillation, unspecified: Secondary | ICD-10-CM | POA: Diagnosis not present

## 2020-10-09 DIAGNOSIS — J9621 Acute and chronic respiratory failure with hypoxia: Secondary | ICD-10-CM | POA: Diagnosis not present

## 2020-10-09 DIAGNOSIS — I5042 Chronic combined systolic (congestive) and diastolic (congestive) heart failure: Secondary | ICD-10-CM | POA: Diagnosis not present

## 2020-10-09 DIAGNOSIS — J449 Chronic obstructive pulmonary disease, unspecified: Secondary | ICD-10-CM | POA: Diagnosis not present

## 2020-10-09 LAB — BLOOD GAS, ARTERIAL
Acid-Base Excess: 11.3 mmol/L — ABNORMAL HIGH (ref 0.0–2.0)
Bicarbonate: 36.4 mmol/L — ABNORMAL HIGH (ref 20.0–28.0)
FIO2: 35
O2 Saturation: 90.9 %
Patient temperature: 36.2
pCO2 arterial: 56.7 mmHg — ABNORMAL HIGH (ref 32.0–48.0)
pH, Arterial: 7.42 (ref 7.350–7.450)
pO2, Arterial: 55.9 mmHg — ABNORMAL LOW (ref 83.0–108.0)

## 2020-10-09 NOTE — Progress Notes (Addendum)
Pulmonary Critical Care Medicine [facility] [place]   PULMONARY CRITICAL CARE SERVICE  PROGRESS NOTE  Date of Service: 10/09/2020  Eric Knapp  BWI:[id number]  DOB: 1941/11/05   DOA: 08/29/2020  Referring Physician: Carron Curie, MD  HPI: Eric Knapp is a 79 y.o. male seen for follow up of Acute on Chronic Respiratory Failure.  Patient remains on 40% aerosol trach collar has completed 24 hours going for 48 this time satting well no distress.  Medications: Reviewed on Rounds  Physical Exam:  Vitals: Pulse 103 respirations 24 BP 199/77 O2 sat 93% temp 97.2  Ventilator Settings not currently on ventilator  . General: Comfortable at this time . Eyes: Grossly normal lids, irises & conjunctiva . ENT: grossly tongue is normal . Neck: no obvious mass . Cardiovascular: S1 S2 normal no gallop . Respiratory: No rales or rhonchi noted . Abdomen: soft . Skin: no rash seen on limited exam . Musculoskeletal: not rigid . Psychiatric:unable to assess . Neurologic: no seizure no involuntary movements         Lab Data:   Basic Metabolic Panel: Recent Labs  Lab 10/03/20 1154 10/04/20 0422 10/05/20 0056 10/05/20 1824 10/06/20 0502 10/07/20 0348 10/08/20 0402  NA 145   < > 143 140 141 139 141  K 5.2*   < > 5.8* 4.5 4.2 4.5 4.2  CL 101   < > 101 98 98 97* 99  CO2 35*   < > 28 31 33* 30 33*  GLUCOSE 174*   < > 227* 233* 263* 139* 156*  BUN 124*   < > 135* 148* 146* 137* 111*  CREATININE 2.07*   < > 2.14* 2.45* 2.35* 2.13* 1.91*  CALCIUM 8.6*   < > 8.6* 8.4* 8.3* 8.3* 8.5*  MG 2.3  --  2.5*  --  2.2  --   --   PHOS  --   --   --   --   --   --  3.4   < > = values in this interval not displayed.    ABG: Recent Labs  Lab 10/05/20 0130 10/09/20 0850  PHART 7.358 7.420  PCO2ART 57.8* 56.7*  PO2ART 70.0* 55.9*  HCO3 32.2* 36.4*  O2SAT 95.0 90.9    Liver Function Tests: Recent Labs  Lab 10/08/20 0402  ALBUMIN 1.7*   No results for input(s): LIPASE, AMYLASE in  the last 168 hours. No results for input(s): AMMONIA in the last 168 hours.  CBC: Recent Labs  Lab 10/05/20 0056 10/06/20 0502 10/06/20 1511 10/07/20 0348  WBC 18.2* 5.9 6.6 6.3  HGB 8.7* 6.8* 6.7* 8.1*  HCT 30.0* 24.5* 23.1* 27.4*  MCV 103.8* 102.5* 101.3* 98.2  PLT 181 PLATELET CLUMPS NOTED ON SMEAR, UNABLE TO ESTIMATE 112* 115*    Cardiac Enzymes: No results for input(s): CKTOTAL, CKMB, CKMBINDEX, TROPONINI in the last 168 hours.  BNP (last 3 results) Recent Labs    09/04/20 1646 09/05/20 1007 10/05/20 1046  BNP 389.9* 458.9* 637.5*    ProBNP (last 3 results) No results for input(s): PROBNP in the last 8760 hours.  Radiological Exams: No results found.  Assessment/Plan Active Problems:   Acute on chronic respiratory failure with hypoxia (HCC)   Chronic combined systolic and diastolic heart failure (HCC)   COPD, severe (HCC)   Chronic atrial fibrillation (HCC)   Metabolic encephalopathy   Pleural effusion, right   1. Acute on chronic respiratory failure with hypoxia patient continue for 48-hour goal on aerosol trach collar currently  40% FiO2 satting well this time no distress continue supportive measures and pulmonary toilet. 2. Chronic combined systolic diastolic heart failure being seen by cardiology we will continue present management. 3. Severe COPD at baseline continue supportive care 4. Chronic atrial fibrillation rate is controlled 5. Right-sided pleural effusion no change continue present therapy   I have personally seen and evaluated the patient, evaluated laboratory and imaging results, formulated the assessment and plan and placed orders. The Patient requires high complexity decision making with multiple systems involvement.  Rounds were done with the Respiratory Therapy Director and Staff therapists and discussed with nursing staff also.  Yevonne Pax, MD [facility] Pulmonary Critical Care Medicine Sleep Medicine

## 2020-10-10 DIAGNOSIS — I482 Chronic atrial fibrillation, unspecified: Secondary | ICD-10-CM | POA: Diagnosis not present

## 2020-10-10 DIAGNOSIS — J449 Chronic obstructive pulmonary disease, unspecified: Secondary | ICD-10-CM | POA: Diagnosis not present

## 2020-10-10 DIAGNOSIS — I5042 Chronic combined systolic (congestive) and diastolic (congestive) heart failure: Secondary | ICD-10-CM | POA: Diagnosis not present

## 2020-10-10 DIAGNOSIS — J9621 Acute and chronic respiratory failure with hypoxia: Secondary | ICD-10-CM | POA: Diagnosis not present

## 2020-10-10 NOTE — Progress Notes (Signed)
Pulmonary Critical Care Medicine [facility] [place]   PULMONARY CRITICAL CARE SERVICE  PROGRESS NOTE  Date of Service: 10/10/2020  Eric Knapp  XBD:[id number]  DOB: 22-Jul-1941   DOA: 09-30-20  Referring Physician: Carron Curie, MD  HPI: Eric Knapp is a 79 y.o. male seen for follow up of Acute on Chronic Respiratory Failure.  Apparently patient had run of ventricular tachycardia and so therefore remains on T collar right now holding off to wean for today cardiology has been following the patient  Medications: Reviewed on Rounds  Physical Exam:  Vitals: Temperature is 96.3 pulse 104 respiratory rate 26 blood pressure is 132/49 saturations 97%  Ventilator Settings on T collar with an FiO2 40%  . General: Comfortable at this time . Eyes: Grossly normal lids, irises & conjunctiva . ENT: grossly tongue is normal . Neck: no obvious mass . Cardiovascular: S1 S2 normal no gallop . Respiratory: No rhonchi no rales noted at this time . Abdomen: soft . Skin: no rash seen on limited exam . Musculoskeletal: not rigid . Psychiatric:unable to assess . Neurologic: no seizure no involuntary movements         Lab Data:   Basic Metabolic Panel: Recent Labs  Lab 10/03/20 1154 10/04/20 0422 10/05/20 0056 10/05/20 1824 10/06/20 0502 10/07/20 0348 10/08/20 0402  NA 145   < > 143 140 141 139 141  K 5.2*   < > 5.8* 4.5 4.2 4.5 4.2  CL 101   < > 101 98 98 97* 99  CO2 35*   < > 28 31 33* 30 33*  GLUCOSE 174*   < > 227* 233* 263* 139* 156*  BUN 124*   < > 135* 148* 146* 137* 111*  CREATININE 2.07*   < > 2.14* 2.45* 2.35* 2.13* 1.91*  CALCIUM 8.6*   < > 8.6* 8.4* 8.3* 8.3* 8.5*  MG 2.3  --  2.5*  --  2.2  --   --   PHOS  --   --   --   --   --   --  3.4   < > = values in this interval not displayed.    ABG: Recent Labs  Lab 10/05/20 0130 10/09/20 0850  PHART 7.358 7.420  PCO2ART 57.8* 56.7*  PO2ART 70.0* 55.9*  HCO3 32.2* 36.4*  O2SAT 95.0 90.9    Liver  Function Tests: Recent Labs  Lab 10/08/20 0402  ALBUMIN 1.7*   No results for input(s): LIPASE, AMYLASE in the last 168 hours. No results for input(s): AMMONIA in the last 168 hours.  CBC: Recent Labs  Lab 10/05/20 0056 10/06/20 0502 10/06/20 1511 10/07/20 0348  WBC 18.2* 5.9 6.6 6.3  HGB 8.7* 6.8* 6.7* 8.1*  HCT 30.0* 24.5* 23.1* 27.4*  MCV 103.8* 102.5* 101.3* 98.2  PLT 181 PLATELET CLUMPS NOTED ON SMEAR, UNABLE TO ESTIMATE 112* 115*    Cardiac Enzymes: No results for input(s): CKTOTAL, CKMB, CKMBINDEX, TROPONINI in the last 168 hours.  BNP (last 3 results) Recent Labs    09/04/20 1646 09/05/20 1007 10/05/20 1046  BNP 389.9* 458.9* 637.5*    ProBNP (last 3 results) No results for input(s): PROBNP in the last 8760 hours.  Radiological Exams: No results found.  Assessment/Plan Active Problems:   Acute on chronic respiratory failure with hypoxia (HCC)   Chronic combined systolic and diastolic heart failure (HCC)   COPD, severe (HCC)   Chronic atrial fibrillation (HCC)   Metabolic encephalopathy   Pleural effusion, right   1.  Acute on chronic respiratory failure hypoxia we will continue with the T collar on 40% FiO2.  Continue secretion management supportive care. 2. Chronic combined systolic diastolic heart failure no change we will continue to follow along. 3. Severe COPD at baseline 4. Chronic atrial fibrillation rate is controlled 5. Metabolic encephalopathy no change 6. Pleural effusion follow x-rays   I have personally seen and evaluated the patient, evaluated laboratory and imaging results, formulated the assessment and plan and placed orders. The Patient requires high complexity decision making with multiple systems involvement.  Rounds were done with the Respiratory Therapy Director and Staff therapists and discussed with nursing staff also.  Yevonne Pax, MD [facility] Pulmonary Critical Care Medicine Sleep Medicine

## 2020-10-11 DIAGNOSIS — J449 Chronic obstructive pulmonary disease, unspecified: Secondary | ICD-10-CM | POA: Diagnosis not present

## 2020-10-11 DIAGNOSIS — J9621 Acute and chronic respiratory failure with hypoxia: Secondary | ICD-10-CM | POA: Diagnosis not present

## 2020-10-11 DIAGNOSIS — I5042 Chronic combined systolic (congestive) and diastolic (congestive) heart failure: Secondary | ICD-10-CM | POA: Diagnosis not present

## 2020-10-11 DIAGNOSIS — I482 Chronic atrial fibrillation, unspecified: Secondary | ICD-10-CM | POA: Diagnosis not present

## 2020-10-11 NOTE — Progress Notes (Signed)
Pulmonary Critical Care Medicine [facility] [place]   PULMONARY CRITICAL CARE SERVICE  PROGRESS NOTE  Date of Service: 10/11/2020  Labrandon Knoch  EHU:[id number]  DOB: 11/02/1941   DOA: 09/01/20  Referring Physician: Carron Curie, MD  HPI: Eric Knapp is a 79 y.o. male seen for follow up of Acute on Chronic Respiratory Failure.  Patient currently is on T collar has been on 20% FiO2 with the tracheostomy in place  Medications: Reviewed on Rounds  Physical Exam:  Vitals: Temperature is 96.4 pulse 89 respiratory 25 blood pressure is 113/81 saturations 98%  Ventilator Settings on T collar FiO2 28%  . General: Comfortable at this time . Eyes: Grossly normal lids, irises & conjunctiva . ENT: grossly tongue is normal . Neck: no obvious mass . Cardiovascular: S1 S2 normal no gallop . Respiratory: Rhonchi no rales noted at this time . Abdomen: soft . Skin: no rash seen on limited exam . Musculoskeletal: not rigid . Psychiatric:unable to assess . Neurologic: no seizure no involuntary movements         Lab Data:   Basic Metabolic Panel: Recent Labs  Lab 10/05/20 0056 10/05/20 1824 10/06/20 0502 10/07/20 0348 10/08/20 0402  NA 143 140 141 139 141  K 5.8* 4.5 4.2 4.5 4.2  CL 101 98 98 97* 99  CO2 28 31 33* 30 33*  GLUCOSE 227* 233* 263* 139* 156*  BUN 135* 148* 146* 137* 111*  CREATININE 2.14* 2.45* 2.35* 2.13* 1.91*  CALCIUM 8.6* 8.4* 8.3* 8.3* 8.5*  MG 2.5*  --  2.2  --   --   PHOS  --   --   --   --  3.4    ABG: Recent Labs  Lab 10/05/20 0130 10/09/20 0850  PHART 7.358 7.420  PCO2ART 57.8* 56.7*  PO2ART 70.0* 55.9*  HCO3 32.2* 36.4*  O2SAT 95.0 90.9    Liver Function Tests: Recent Labs  Lab 10/08/20 0402  ALBUMIN 1.7*   No results for input(s): LIPASE, AMYLASE in the last 168 hours. No results for input(s): AMMONIA in the last 168 hours.  CBC: Recent Labs  Lab 10/05/20 0056 10/06/20 0502 10/06/20 1511 10/07/20 0348  WBC 18.2* 5.9  6.6 6.3  HGB 8.7* 6.8* 6.7* 8.1*  HCT 30.0* 24.5* 23.1* 27.4*  MCV 103.8* 102.5* 101.3* 98.2  PLT 181 PLATELET CLUMPS NOTED ON SMEAR, UNABLE TO ESTIMATE 112* 115*    Cardiac Enzymes: No results for input(s): CKTOTAL, CKMB, CKMBINDEX, TROPONINI in the last 168 hours.  BNP (last 3 results) Recent Labs    09/04/20 1646 09/05/20 1007 10/05/20 1046  BNP 389.9* 458.9* 637.5*    ProBNP (last 3 results) No results for input(s): PROBNP in the last 8760 hours.  Radiological Exams: No results found.  Assessment/Plan Active Problems:   Acute on chronic respiratory failure with hypoxia (HCC)   Chronic combined systolic and diastolic heart failure (HCC)   COPD, severe (HCC)   Chronic atrial fibrillation (HCC)   Metabolic encephalopathy   Pleural effusion, right   1. Acute on chronic respiratory failure with hypoxia we will continue with the weaning process downsize the trach to a #6 cuffless trach 2. Chronic combined systolic diastolic heart failure appears to be compensated 3. Severe COPD at baseline we will continue to follow 4. Chronic atrial fibrillation rate is controlled at this time 5. Metabolic encephalopathy no change 6. Pleural effusion following x-rays   I have personally seen and evaluated the patient, evaluated laboratory and imaging results, formulated the assessment  and plan and placed orders. The Patient requires high complexity decision making with multiple systems involvement.  Rounds were done with the Respiratory Therapy Director and Staff therapists and discussed with nursing staff also.  Allyne Gee, MD [facility] Pulmonary Critical Care Medicine Sleep Medicine

## 2020-10-12 DIAGNOSIS — I482 Chronic atrial fibrillation, unspecified: Secondary | ICD-10-CM | POA: Diagnosis not present

## 2020-10-12 DIAGNOSIS — I5042 Chronic combined systolic (congestive) and diastolic (congestive) heart failure: Secondary | ICD-10-CM | POA: Diagnosis not present

## 2020-10-12 DIAGNOSIS — J449 Chronic obstructive pulmonary disease, unspecified: Secondary | ICD-10-CM | POA: Diagnosis not present

## 2020-10-12 DIAGNOSIS — J9621 Acute and chronic respiratory failure with hypoxia: Secondary | ICD-10-CM | POA: Diagnosis not present

## 2020-10-12 NOTE — Progress Notes (Signed)
Pulmonary Critical Care Medicine [facility] [place]   PULMONARY CRITICAL CARE SERVICE  PROGRESS NOTE  Date of Service: 10/12/2020  Eric Knapp  TKW:[id number]  DOB: Feb 03, 1941   DOA: 2020-09-11  Referring Physician: Carron Curie, MD  HPI: Eric Knapp is a 79 y.o. male seen for follow up of Acute on Chronic Respiratory Failure.  Patient currently is on T collar has been on 35% FiO2 apparently had some issues with ventricular tachycardia  Medications: Reviewed on Rounds  Physical Exam:  Vitals: Temperature is 97.0 pulse 95 respiratory 24 blood pressure is 123/88 saturations 93%  Ventilator Settings on T collar with an FiO2 35%  . General: Comfortable at this time . Eyes: Grossly normal lids, irises & conjunctiva . ENT: grossly tongue is normal . Neck: no obvious mass . Cardiovascular: S1 S2 normal no gallop . Respiratory: No rhonchi very coarse breath sounds . Abdomen: soft . Skin: no rash seen on limited exam . Musculoskeletal: not rigid . Psychiatric:unable to assess . Neurologic: no seizure no involuntary movements         Lab Data:   Basic Metabolic Panel: Recent Labs  Lab 10/05/20 1824 10/06/20 0502 10/07/20 0348 10/08/20 0402  NA 140 141 139 141  K 4.5 4.2 4.5 4.2  CL 98 98 97* 99  CO2 31 33* 30 33*  GLUCOSE 233* 263* 139* 156*  BUN 148* 146* 137* 111*  CREATININE 2.45* 2.35* 2.13* 1.91*  CALCIUM 8.4* 8.3* 8.3* 8.5*  MG  --  2.2  --   --   PHOS  --   --   --  3.4    ABG: Recent Labs  Lab 10/09/20 0850  PHART 7.420  PCO2ART 56.7*  PO2ART 55.9*  HCO3 36.4*  O2SAT 90.9    Liver Function Tests: Recent Labs  Lab 10/08/20 0402  ALBUMIN 1.7*   No results for input(s): LIPASE, AMYLASE in the last 168 hours. No results for input(s): AMMONIA in the last 168 hours.  CBC: Recent Labs  Lab 10/06/20 0502 10/06/20 1511 10/07/20 0348  WBC 5.9 6.6 6.3  HGB 6.8* 6.7* 8.1*  HCT 24.5* 23.1* 27.4*  MCV 102.5* 101.3* 98.2  PLT PLATELET  CLUMPS NOTED ON SMEAR, UNABLE TO ESTIMATE 112* 115*    Cardiac Enzymes: No results for input(s): CKTOTAL, CKMB, CKMBINDEX, TROPONINI in the last 168 hours.  BNP (last 3 results) Recent Labs    09/04/20 1646 09/05/20 1007 10/05/20 1046  BNP 389.9* 458.9* 637.5*    ProBNP (last 3 results) No results for input(s): PROBNP in the last 8760 hours.  Radiological Exams: No results found.  Assessment/Plan Active Problems:   Acute on chronic respiratory failure with hypoxia (HCC)   Chronic combined systolic and diastolic heart failure (HCC)   COPD, severe (HCC)   Chronic atrial fibrillation (HCC)   Metabolic encephalopathy   Pleural effusion, right   1. Acute on chronic respiratory failure with hypoxia we will continue with T collar watch for any further arrhythmias cardiology is following the patient. 2. Severe COPD medical management 3. Chronic combined systolic diastolic heart failure right now is compensated prognosis guarded 4. Chronic atrial fibrillation with breakthrough tachycardia noted 5. Metabolic encephalopathy no change 6. Pleural effusion follow radiologically   I have personally seen and evaluated the patient, evaluated laboratory and imaging results, formulated the assessment and plan and placed orders. The Patient requires high complexity decision making with multiple systems involvement.  Rounds were done with the Respiratory Therapy Director and Staff therapists and  discussed with nursing staff also.  Allyne Gee, MD [facility] Pulmonary Critical Care Medicine Sleep Medicine

## 2020-10-13 ENCOUNTER — Other Ambulatory Visit (HOSPITAL_COMMUNITY): Payer: Medicare Other

## 2020-10-13 DIAGNOSIS — I482 Chronic atrial fibrillation, unspecified: Secondary | ICD-10-CM | POA: Diagnosis not present

## 2020-10-13 DIAGNOSIS — I5042 Chronic combined systolic (congestive) and diastolic (congestive) heart failure: Secondary | ICD-10-CM | POA: Diagnosis not present

## 2020-10-13 DIAGNOSIS — J449 Chronic obstructive pulmonary disease, unspecified: Secondary | ICD-10-CM | POA: Diagnosis not present

## 2020-10-13 DIAGNOSIS — J9621 Acute and chronic respiratory failure with hypoxia: Secondary | ICD-10-CM | POA: Diagnosis not present

## 2020-10-13 LAB — CBC
HCT: 30.4 % — ABNORMAL LOW (ref 39.0–52.0)
Hemoglobin: 8.8 g/dL — ABNORMAL LOW (ref 13.0–17.0)
MCH: 29.2 pg (ref 26.0–34.0)
MCHC: 28.9 g/dL — ABNORMAL LOW (ref 30.0–36.0)
MCV: 101 fL — ABNORMAL HIGH (ref 80.0–100.0)
Platelets: 212 10*3/uL (ref 150–400)
RBC: 3.01 MIL/uL — ABNORMAL LOW (ref 4.22–5.81)
RDW: 20 % — ABNORMAL HIGH (ref 11.5–15.5)
WBC: 7.2 10*3/uL (ref 4.0–10.5)
nRBC: 0 % (ref 0.0–0.2)

## 2020-10-13 LAB — BASIC METABOLIC PANEL
Anion gap: 10 (ref 5–15)
BUN: 118 mg/dL — ABNORMAL HIGH (ref 8–23)
CO2: 31 mmol/L (ref 22–32)
Calcium: 8.7 mg/dL — ABNORMAL LOW (ref 8.9–10.3)
Chloride: 97 mmol/L — ABNORMAL LOW (ref 98–111)
Creatinine, Ser: 1.82 mg/dL — ABNORMAL HIGH (ref 0.61–1.24)
GFR, Estimated: 38 mL/min — ABNORMAL LOW (ref >60)
Glucose, Bld: 221 mg/dL — ABNORMAL HIGH (ref 70–99)
Potassium: 5.6 mmol/L — ABNORMAL HIGH (ref 3.5–5.1)
Sodium: 138 mmol/L (ref 135–145)

## 2020-10-13 LAB — MAGNESIUM: Magnesium: 2.1 mg/dL (ref 1.7–2.4)

## 2020-10-13 NOTE — Progress Notes (Signed)
Pulmonary Critical Care Medicine [facility] [place]   PULMONARY CRITICAL CARE SERVICE  PROGRESS NOTE  Date of Service: 10/13/2020  Eric Knapp  [id number]:[id number]  DOB: 03-Mar-1941   DOA: 09/10/2020  Referring Physician: Carron Curie, MD  HPI: Eric Knapp is a 79 y.o. male seen for follow up of Acute on Chronic Respiratory Failure.  Patient currently is on T collar apparently has still is having some issues with the ventricular tachycardia so was placed back on the ventilator.  Patient is okay on pressure support right now -112/5  Medications: Reviewed on Rounds  Physical Exam:  Vitals: Temperature 96.7 pulse 88 respiratory 24 blood pressure is 134/78 saturations 92%  Ventilator Settings on ventilator pressure support FiO2 35% pressure 12/5  . General: Comfortable at this time . Eyes: Grossly normal lids, irises & conjunctiva . ENT: grossly tongue is normal . Neck: no obvious mass . Cardiovascular: S1 S2 normal no gallop . Respiratory: Very coarse breath sounds . Abdomen: soft . Skin: no rash seen on limited exam . Musculoskeletal: not rigid . Psychiatric:unable to assess . Neurologic: no seizure no involuntary movements         Lab Data:   Basic Metabolic Panel: Recent Labs  Lab 10/07/20 0348 10/08/20 0402 10/13/20 0904  NA 139 141 138  K 4.5 4.2 5.6*  CL 97* 99 97*  CO2 30 33* 31  GLUCOSE 139* 156* 221*  BUN 137* 111* 118*  CREATININE 2.13* 1.91* 1.82*  CALCIUM 8.3* 8.5* 8.7*  MG  --   --  2.1  PHOS  --  3.4  --     ABG: Recent Labs  Lab 10/09/20 0850  PHART 7.420  PCO2ART 56.7*  PO2ART 55.9*  HCO3 36.4*  O2SAT 90.9    Liver Function Tests: Recent Labs  Lab 10/08/20 0402  ALBUMIN 1.7*   No results for input(s): LIPASE, AMYLASE in the last 168 hours. No results for input(s): AMMONIA in the last 168 hours.  CBC: Recent Labs  Lab 10/06/20 1511 10/07/20 0348 10/13/20 0904  WBC 6.6 6.3 7.2  HGB 6.7* 8.1* 8.8*  HCT 23.1* 27.4* 30.4*   MCV 101.3* 98.2 101.0*  PLT 112* 115* 212    Cardiac Enzymes: No results for input(s): CKTOTAL, CKMB, CKMBINDEX, TROPONINI in the last 168 hours.  BNP (last 3 results) Recent Labs    09/04/20 1646 09/05/20 1007 10/05/20 1046  BNP 389.9* 458.9* 637.5*    ProBNP (last 3 results) No results for input(s): PROBNP in the last 8760 hours.  Radiological Exams: DG CHEST PORT 1 VIEW  Result Date: 10/13/2020 CLINICAL DATA:  Shortness of breath EXAM: PORTABLE CHEST 1 VIEW COMPARISON:  October 05, 2020. FINDINGS: Tracheostomy catheter tip is 4.1 cm above the carina. No pneumothorax. There is mild cardiomegaly with pulmonary venous hypertension. There are small pleural effusions bilaterally with diffuse interstitial edema. Ill-defined patchy airspace opacity is also noted bilaterally. There is aortic atherosclerosis. No adenopathy. There is degenerative change in each shoulder. IMPRESSION: Tracheostomy as described without pneumothorax. Mild cardiomegaly with pulmonary vascular congestion. Small pleural effusions with evidence of pulmonary edema. Overall appearance is consistent with congestive heart failure. A degree of superimposed pneumonia bilaterally cannot be entirely excluded, although all of the findings may be accounted for by pulmonary edema. Aortic Atherosclerosis (ICD10-I70.0). Electronically Signed   By: Bretta Bang III M.D.   On: 10/13/2020 13:04    Assessment/Plan Active Problems:   Acute on chronic respiratory failure with hypoxia (HCC)   Chronic combined systolic  and diastolic heart failure (HCC)   COPD, severe (HCC)   Chronic atrial fibrillation (HCC)   Metabolic encephalopathy   Pleural effusion, right   1. Acute on chronic respiratory failure with hypoxia we will hold T collar weaning right now continue with pressure support reassess 2. Ventricular tachycardia cardiology consultation needs follow-up 3. Combined systolic diastolic heart failure supportive  care 4. Severe COPD medical management 5. Chronic atrial fibrillation rate controlled medical management 6. Metabolic encephalopathy at baseline we will continue supportive care   I have personally seen and evaluated the patient, evaluated laboratory and imaging results, formulated the assessment and plan and placed orders. The Patient requires high complexity decision making with multiple systems involvement.  Rounds were done with the Respiratory Therapy Director and Staff therapists and discussed with nursing staff also.  Yevonne Pax, MD [facility] Pulmonary Critical Care Medicine Sleep Medicine

## 2020-10-14 DIAGNOSIS — J9621 Acute and chronic respiratory failure with hypoxia: Secondary | ICD-10-CM | POA: Diagnosis not present

## 2020-10-14 DIAGNOSIS — I5042 Chronic combined systolic (congestive) and diastolic (congestive) heart failure: Secondary | ICD-10-CM | POA: Diagnosis not present

## 2020-10-14 DIAGNOSIS — I482 Chronic atrial fibrillation, unspecified: Secondary | ICD-10-CM | POA: Diagnosis not present

## 2020-10-14 DIAGNOSIS — J449 Chronic obstructive pulmonary disease, unspecified: Secondary | ICD-10-CM | POA: Diagnosis not present

## 2020-10-14 LAB — BASIC METABOLIC PANEL
Anion gap: 10 (ref 5–15)
BUN: 127 mg/dL — ABNORMAL HIGH (ref 8–23)
CO2: 29 mmol/L (ref 22–32)
Calcium: 8.4 mg/dL — ABNORMAL LOW (ref 8.9–10.3)
Chloride: 93 mmol/L — ABNORMAL LOW (ref 98–111)
Creatinine, Ser: 1.99 mg/dL — ABNORMAL HIGH (ref 0.61–1.24)
GFR, Estimated: 34 mL/min — ABNORMAL LOW (ref >60)
Glucose, Bld: 203 mg/dL — ABNORMAL HIGH (ref 70–99)
Potassium: 5.6 mmol/L — ABNORMAL HIGH (ref 3.5–5.1)
Sodium: 132 mmol/L — ABNORMAL LOW (ref 135–145)

## 2020-10-14 NOTE — Progress Notes (Signed)
Pulmonary Critical Care Medicine [facility] [place]   PULMONARY CRITICAL CARE SERVICE  PROGRESS NOTE  Date of Service: 10/14/2020  Eric Knapp  WLN:[id number]  DOB: 1941-01-19   DOA: 09/14/2020  Referring Physician: Carron Curie, MD  HPI: Eric Knapp is a 79 y.o. male seen for follow up of Acute on Chronic Respiratory Failure.  Patient currently is on T collar good saturations are noted.  Secretions have been fairly moderate  Medications: Reviewed on Rounds  Physical Exam:  Vitals: Temperature is 98.0 pulse 70 respiratory 24 blood pressure is 104/80 saturations 100%  Ventilator Settings on T collar  . General: Comfortable at this time . Eyes: Grossly normal lids, irises & conjunctiva . ENT: grossly tongue is normal . Neck: no obvious mass . Cardiovascular: S1 S2 normal no gallop . Respiratory: No rhonchi no rales are noted at this time . Abdomen: soft . Skin: no rash seen on limited exam . Musculoskeletal: not rigid . Psychiatric:unable to assess . Neurologic: no seizure no involuntary movements         Lab Data:   Basic Metabolic Panel: Recent Labs  Lab 10/08/20 0402 10/13/20 0904  NA 141 138  K 4.2 5.6*  CL 99 97*  CO2 33* 31  GLUCOSE 156* 221*  BUN 111* 118*  CREATININE 1.91* 1.82*  CALCIUM 8.5* 8.7*  MG  --  2.1  PHOS 3.4  --     ABG: Recent Labs  Lab 10/09/20 0850  PHART 7.420  PCO2ART 56.7*  PO2ART 55.9*  HCO3 36.4*  O2SAT 90.9    Liver Function Tests: Recent Labs  Lab 10/08/20 0402  ALBUMIN 1.7*   No results for input(s): LIPASE, AMYLASE in the last 168 hours. No results for input(s): AMMONIA in the last 168 hours.  CBC: Recent Labs  Lab 10/13/20 0904  WBC 7.2  HGB 8.8*  HCT 30.4*  MCV 101.0*  PLT 212    Cardiac Enzymes: No results for input(s): CKTOTAL, CKMB, CKMBINDEX, TROPONINI in the last 168 hours.  BNP (last 3 results) Recent Labs    09/04/20 1646 09/05/20 1007 10/05/20 1046  BNP 389.9* 458.9* 637.5*     ProBNP (last 3 results) No results for input(s): PROBNP in the last 8760 hours.  Radiological Exams: DG CHEST PORT 1 VIEW  Result Date: 10/13/2020 CLINICAL DATA:  Shortness of breath EXAM: PORTABLE CHEST 1 VIEW COMPARISON:  October 05, 2020. FINDINGS: Tracheostomy catheter tip is 4.1 cm above the carina. No pneumothorax. There is mild cardiomegaly with pulmonary venous hypertension. There are small pleural effusions bilaterally with diffuse interstitial edema. Ill-defined patchy airspace opacity is also noted bilaterally. There is aortic atherosclerosis. No adenopathy. There is degenerative change in each shoulder. IMPRESSION: Tracheostomy as described without pneumothorax. Mild cardiomegaly with pulmonary vascular congestion. Small pleural effusions with evidence of pulmonary edema. Overall appearance is consistent with congestive heart failure. A degree of superimposed pneumonia bilaterally cannot be entirely excluded, although all of the findings may be accounted for by pulmonary edema. Aortic Atherosclerosis (ICD10-I70.0). Electronically Signed   By: Bretta Bang III M.D.   On: 10/13/2020 13:04    Assessment/Plan Active Problems:   Acute on chronic respiratory failure with hypoxia (HCC)   Chronic combined systolic and diastolic heart failure (HCC)   COPD, severe (HCC)   Chronic atrial fibrillation (HCC)   Metabolic encephalopathy   Pleural effusion, right   1. Acute on chronic respiratory failure with hypoxia we will continue with the T-piece as ordered.  We will continue  titrating oxygen supportive care 2. Combined systolic diastolic heart failure follow-up cardiology consultation 3. Severe COPD at baseline we will monitor 4. Chronic atrial fibrillation rate is controlled 5. Metabolic encephalopathy no change 6. Pleural effusion patient is at baseline   I have personally seen and evaluated the patient, evaluated laboratory and imaging results, formulated the assessment and  plan and placed orders. The Patient requires high complexity decision making with multiple systems involvement.  Rounds were done with the Respiratory Therapy Director and Staff therapists and discussed with nursing staff also.  Yevonne Pax, MD [facility] Pulmonary Critical Care Medicine Sleep Medicine

## 2020-10-15 DIAGNOSIS — I5042 Chronic combined systolic (congestive) and diastolic (congestive) heart failure: Secondary | ICD-10-CM | POA: Diagnosis not present

## 2020-10-15 DIAGNOSIS — J449 Chronic obstructive pulmonary disease, unspecified: Secondary | ICD-10-CM | POA: Diagnosis not present

## 2020-10-15 DIAGNOSIS — J9621 Acute and chronic respiratory failure with hypoxia: Secondary | ICD-10-CM | POA: Diagnosis not present

## 2020-10-15 DIAGNOSIS — I482 Chronic atrial fibrillation, unspecified: Secondary | ICD-10-CM | POA: Diagnosis not present

## 2020-10-15 LAB — POTASSIUM: Potassium: 5.6 mmol/L — ABNORMAL HIGH (ref 3.5–5.1)

## 2020-10-15 NOTE — Progress Notes (Addendum)
Pulmonary Critical Care Medicine [facility] [place]   PULMONARY CRITICAL CARE SERVICE  PROGRESS NOTE  Date of Service: 10/15/2020  Eric Knapp  [id number]:[id number]  DOB: 1941/09/21   DOA: 09/03/2020  Referring Physician: Carron Curie, MD  HPI: Eric Knapp is a 79 y.o. male seen for follow up of Acute on Chronic Respiratory Failure.  Family has been wanting to take the patient home on the ventilator and get hospice involvement patient has been basically failing weaning attempts for any Prolonged 6 to 12 hours attempt  Medications: Reviewed on Rounds  Physical Exam:  Vitals: Temperature 97.1 pulse 80 respiratory rate 26 blood pressure is 125/69 saturations 100%  Ventilator Settings on pressure support FiO2 is 35% pressure 12/5  . General: Comfortable at this time . Eyes: Grossly normal lids, irises & conjunctiva . ENT: grossly tongue is normal . Neck: no obvious mass . Cardiovascular: S1 S2 normal no gallop . Respiratory: Coarse breath sounds with few scattered rhonchi . Abdomen: soft . Skin: no rash seen on limited exam . Musculoskeletal: not rigid . Psychiatric:unable to assess . Neurologic: no seizure no involuntary movements         Lab Data:   Basic Metabolic Panel: Recent Labs  Lab 10/13/20 0904 10/14/20 1801  NA 138 132*  K 5.6* 5.6*  CL 97* 93*  CO2 31 29  GLUCOSE 221* 203*  BUN 118* 127*  CREATININE 1.82* 1.99*  CALCIUM 8.7* 8.4*  MG 2.1  --     ABG: Recent Labs  Lab 10/09/20 0850  PHART 7.420  PCO2ART 56.7*  PO2ART 55.9*  HCO3 36.4*  O2SAT 90.9    Liver Function Tests: No results for input(s): AST, ALT, ALKPHOS, BILITOT, PROT, ALBUMIN in the last 168 hours. No results for input(s): LIPASE, AMYLASE in the last 168 hours. No results for input(s): AMMONIA in the last 168 hours.  CBC: Recent Labs  Lab 10/13/20 0904  WBC 7.2  HGB 8.8*  HCT 30.4*  MCV 101.0*  PLT 212    Cardiac Enzymes: No results for input(s): CKTOTAL, CKMB,  CKMBINDEX, TROPONINI in the last 168 hours.  BNP (last 3 results) Recent Labs    09/04/20 1646 09/05/20 1007 10/05/20 1046  BNP 389.9* 458.9* 637.5*    ProBNP (last 3 results) No results for input(s): PROBNP in the last 8760 hours.  Radiological Exams: DG CHEST PORT 1 VIEW  Result Date: 10/13/2020 CLINICAL DATA:  Shortness of breath EXAM: PORTABLE CHEST 1 VIEW COMPARISON:  October 05, 2020. FINDINGS: Tracheostomy catheter tip is 4.1 cm above the carina. No pneumothorax. There is mild cardiomegaly with pulmonary venous hypertension. There are small pleural effusions bilaterally with diffuse interstitial edema. Ill-defined patchy airspace opacity is also noted bilaterally. There is aortic atherosclerosis. No adenopathy. There is degenerative change in each shoulder. IMPRESSION: Tracheostomy as described without pneumothorax. Mild cardiomegaly with pulmonary vascular congestion. Small pleural effusions with evidence of pulmonary edema. Overall appearance is consistent with congestive heart failure. A degree of superimposed pneumonia bilaterally cannot be entirely excluded, although all of the findings may be accounted for by pulmonary edema. Aortic Atherosclerosis (ICD10-I70.0). Electronically Signed   By: Bretta Bang III M.D.   On: 10/13/2020 13:04    Assessment/Plan Active Problems:   Acute on chronic respiratory failure with hypoxia (HCC)   Chronic combined systolic and diastolic heart failure (HCC)   COPD, severe (HCC)   Chronic atrial fibrillation (HCC)   Metabolic encephalopathy   Pleural effusion, right   1. Acute on chronic  respiratory failure with hypoxia patient has been deemed as baseline.  Family is going to be taking him home with home ventilation and then transferring to hospice. 2. Chronic combined systolic diastolic heart failure patient is at baseline overall.  prognosis is poor 3. Severe COPD medical management we will continue to follow along. 4. Chronic atrial  fibrillation rate is controlled 5. Metabolic encephalopathy no change we will continue with supportive care 6. Pleural effusion at baseline   I have personally seen and evaluated the patient, evaluated laboratory and imaging results, formulated the assessment and plan and placed orders. The Patient requires high complexity decision making with multiple systems involvement.  Rounds were done with the Respiratory Therapy Director and Staff therapists and discussed with nursing staff also.  Yevonne Pax, MD [facility] Pulmonary Critical Care Medicine Sleep Medicine

## 2020-10-16 ENCOUNTER — Other Ambulatory Visit (HOSPITAL_COMMUNITY): Payer: Medicare Other

## 2020-10-16 DIAGNOSIS — I5042 Chronic combined systolic (congestive) and diastolic (congestive) heart failure: Secondary | ICD-10-CM | POA: Diagnosis not present

## 2020-10-16 DIAGNOSIS — I482 Chronic atrial fibrillation, unspecified: Secondary | ICD-10-CM | POA: Diagnosis not present

## 2020-10-16 DIAGNOSIS — J9621 Acute and chronic respiratory failure with hypoxia: Secondary | ICD-10-CM | POA: Diagnosis not present

## 2020-10-16 DIAGNOSIS — J449 Chronic obstructive pulmonary disease, unspecified: Secondary | ICD-10-CM | POA: Diagnosis not present

## 2020-10-16 LAB — BASIC METABOLIC PANEL
Anion gap: 11 (ref 5–15)
BUN: 127 mg/dL — ABNORMAL HIGH (ref 8–23)
CO2: 30 mmol/L (ref 22–32)
Calcium: 8.5 mg/dL — ABNORMAL LOW (ref 8.9–10.3)
Chloride: 92 mmol/L — ABNORMAL LOW (ref 98–111)
Creatinine, Ser: 1.84 mg/dL — ABNORMAL HIGH (ref 0.61–1.24)
GFR, Estimated: 38 mL/min — ABNORMAL LOW (ref >60)
Glucose, Bld: 175 mg/dL — ABNORMAL HIGH (ref 70–99)
Potassium: 5 mmol/L (ref 3.5–5.1)
Sodium: 133 mmol/L — ABNORMAL LOW (ref 135–145)

## 2020-10-16 LAB — CBC
HCT: 29.7 % — ABNORMAL LOW (ref 39.0–52.0)
Hemoglobin: 8.8 g/dL — ABNORMAL LOW (ref 13.0–17.0)
MCH: 29 pg (ref 26.0–34.0)
MCHC: 29.6 g/dL — ABNORMAL LOW (ref 30.0–36.0)
MCV: 98 fL (ref 80.0–100.0)
Platelets: 236 10*3/uL (ref 150–400)
RBC: 3.03 MIL/uL — ABNORMAL LOW (ref 4.22–5.81)
RDW: 19.3 % — ABNORMAL HIGH (ref 11.5–15.5)
WBC: 6.4 10*3/uL (ref 4.0–10.5)
nRBC: 0 % (ref 0.0–0.2)

## 2020-10-16 NOTE — Progress Notes (Signed)
Pulmonary Critical Care Medicine [facility] [place]   PULMONARY CRITICAL CARE SERVICE  PROGRESS NOTE  Date of Service: 10/16/2020  Eric Knapp  VQM:[id number]  DOB: 02-11-1941   DOA: 08-Sep-2020  Referring Physician: Carron Curie, MD  HPI: Eric Knapp is a 79 y.o. male seen for follow up of Acute on Chronic Respiratory Failure.  Patient currently is on pressure support possible discharged with home ventilation.  No weaning to be done  Medications: Reviewed on Rounds  Physical Exam:  Vitals: Temperature is 97.6 pulse 93 respiratory 20 blood pressure is 125/67 saturations 98%  Ventilator Settings on pressure support FiO2 35%  . General: Comfortable at this time . Eyes: Grossly normal lids, irises & conjunctiva . ENT: grossly tongue is normal . Neck: no obvious mass . Cardiovascular: S1 S2 normal no gallop . Respiratory: No rhonchi no rales noted at this time . Abdomen: soft . Skin: no rash seen on limited exam . Musculoskeletal: not rigid . Psychiatric:unable to assess . Neurologic: no seizure no involuntary movements         Lab Data:   Basic Metabolic Panel: Recent Labs  Lab 10/13/20 0904 10/14/20 1801 10/15/20 1245 10/16/20 0439  NA 138 132*  --  133*  K 5.6* 5.6* 5.6* 5.0  CL 97* 93*  --  92*  CO2 31 29  --  30  GLUCOSE 221* 203*  --  175*  BUN 118* 127*  --  127*  CREATININE 1.82* 1.99*  --  1.84*  CALCIUM 8.7* 8.4*  --  8.5*  MG 2.1  --   --   --     ABG: No results for input(s): PHART, PCO2ART, PO2ART, HCO3, O2SAT in the last 168 hours.  Liver Function Tests: No results for input(s): AST, ALT, ALKPHOS, BILITOT, PROT, ALBUMIN in the last 168 hours. No results for input(s): LIPASE, AMYLASE in the last 168 hours. No results for input(s): AMMONIA in the last 168 hours.  CBC: Recent Labs  Lab 10/13/20 0904 10/16/20 0439  WBC 7.2 6.4  HGB 8.8* 8.8*  HCT 30.4* 29.7*  MCV 101.0* 98.0  PLT 212 236    Cardiac Enzymes: No results for  input(s): CKTOTAL, CKMB, CKMBINDEX, TROPONINI in the last 168 hours.  BNP (last 3 results) Recent Labs    09/04/20 1646 09/05/20 1007 10/05/20 1046  BNP 389.9* 458.9* 637.5*    ProBNP (last 3 results) No results for input(s): PROBNP in the last 8760 hours.  Radiological Exams: No results found.  Assessment/Plan Active Problems:   Acute on chronic respiratory failure with hypoxia (HCC)   Chronic combined systolic and diastolic heart failure (HCC)   COPD, severe (HCC)   Chronic atrial fibrillation (HCC)   Metabolic encephalopathy   Pleural effusion, right   1. Acute on chronic respiratory failure with hypoxia continue on the ventilator no weaning per possibility being discharged home 2. Chronic combined systolic and diastolic heart failure compensated we will monitor 3. Severe COPD at baseline continue with supportive care 4. Chronic atrial fibrillation rate is controlled at this time. 5. Metabolic encephalopathy dementia 6. Pleural effusion follow-up x-rays   I have personally seen and evaluated the patient, evaluated laboratory and imaging results, formulated the assessment and plan and placed orders. The Patient requires high complexity decision making with multiple systems involvement.  Rounds were done with the Respiratory Therapy Director and Staff therapists and discussed with nursing staff also.  Yevonne Pax, MD [facility] Pulmonary Critical Care Medicine Sleep Medicine

## 2020-10-17 DIAGNOSIS — I5042 Chronic combined systolic (congestive) and diastolic (congestive) heart failure: Secondary | ICD-10-CM | POA: Diagnosis not present

## 2020-10-17 DIAGNOSIS — J9621 Acute and chronic respiratory failure with hypoxia: Secondary | ICD-10-CM | POA: Diagnosis not present

## 2020-10-17 DIAGNOSIS — J449 Chronic obstructive pulmonary disease, unspecified: Secondary | ICD-10-CM | POA: Diagnosis not present

## 2020-10-17 DIAGNOSIS — I482 Chronic atrial fibrillation, unspecified: Secondary | ICD-10-CM | POA: Diagnosis not present

## 2020-10-17 LAB — BASIC METABOLIC PANEL
Anion gap: 7 (ref 5–15)
BUN: 127 mg/dL — ABNORMAL HIGH (ref 8–23)
CO2: 34 mmol/L — ABNORMAL HIGH (ref 22–32)
Calcium: 7.7 mg/dL — ABNORMAL LOW (ref 8.9–10.3)
Chloride: 93 mmol/L — ABNORMAL LOW (ref 98–111)
Creatinine, Ser: 1.75 mg/dL — ABNORMAL HIGH (ref 0.61–1.24)
GFR, Estimated: 40 mL/min — ABNORMAL LOW (ref >60)
Glucose, Bld: 78 mg/dL (ref 70–99)
Potassium: 4.7 mmol/L (ref 3.5–5.1)
Sodium: 134 mmol/L — ABNORMAL LOW (ref 135–145)

## 2020-10-17 NOTE — Progress Notes (Signed)
Pulmonary Critical Care Medicine [facility] [place]   PULMONARY CRITICAL CARE SERVICE  PROGRESS NOTE  Date of Service: 10/17/2020  Eric Knapp  QMV:[id number]  DOB: 1941/06/23   DOA: 2020/09/04  Referring Physician: Carron Curie, MD  HPI: Eric Knapp is a 79 y.o. male seen for follow up of Acute on Chronic Respiratory Failure.  Patient currently is on pressure support 12/5.  Patient's baseline is on the ventilator.  Apparently family is trying to take him home for further management he will continue with vent support.  Medications: Reviewed on Rounds  Physical Exam:  Vitals: Temperature is 96.6 pulse 88 respiratory rate 20 blood pressure is 156/78 saturations 99%  Ventilator Settings on pressure support FiO2 35% tidal volume 376 pressure 12/5  . General: Comfortable at this time . Eyes: Grossly normal lids, irises & conjunctiva . ENT: grossly tongue is normal . Neck: no obvious mass . Cardiovascular: S1 S2 normal no gallop . Respiratory: No rhonchi coarse breath sounds . Abdomen: soft . Skin: no rash seen on limited exam . Musculoskeletal: not rigid . Psychiatric:unable to assess . Neurologic: no seizure no involuntary movements         Lab Data:   Basic Metabolic Panel: Recent Labs  Lab 10/13/20 0904 10/14/20 1801 10/15/20 1245 10/16/20 0439 10/17/20 0845  NA 138 132*  --  133* 134*  K 5.6* 5.6* 5.6* 5.0 4.7  CL 97* 93*  --  92* 93*  CO2 31 29  --  30 34*  GLUCOSE 221* 203*  --  175* 78  BUN 118* 127*  --  127* 127*  CREATININE 1.82* 1.99*  --  1.84* 1.75*  CALCIUM 8.7* 8.4*  --  8.5* 7.7*  MG 2.1  --   --   --   --     ABG: No results for input(s): PHART, PCO2ART, PO2ART, HCO3, O2SAT in the last 168 hours.  Liver Function Tests: No results for input(s): AST, ALT, ALKPHOS, BILITOT, PROT, ALBUMIN in the last 168 hours. No results for input(s): LIPASE, AMYLASE in the last 168 hours. No results for input(s): AMMONIA in the last 168  hours.  CBC: Recent Labs  Lab 10/13/20 0904 10/16/20 0439  WBC 7.2 6.4  HGB 8.8* 8.8*  HCT 30.4* 29.7*  MCV 101.0* 98.0  PLT 212 236    Cardiac Enzymes: No results for input(s): CKTOTAL, CKMB, CKMBINDEX, TROPONINI in the last 168 hours.  BNP (last 3 results) Recent Labs    09/04/20 1646 09/05/20 1007 10/05/20 1046  BNP 389.9* 458.9* 637.5*    ProBNP (last 3 results) No results for input(s): PROBNP in the last 8760 hours.  Radiological Exams: DG Abd 1 View  Result Date: 10/16/2020 CLINICAL DATA:  Ileus.  Distention.  Question of fluid/edema. EXAM: ABDOMEN - 1 VIEW COMPARISON:  09/13/2020 FINDINGS: Biliary stent overlies the RIGHT UPPER QUADRANT, unchanged in appearance. Bowel gas pattern is nonobstructive. Gastrostomy tube overlies the stomach. No evidence for organomegaly. Degenerative changes are seen in the spine and hips. IMPRESSION: Nonobstructive bowel gas pattern. Electronically Signed   By: Norva Pavlov M.D.   On: 10/16/2020 12:59    Assessment/Plan Active Problems:   Acute on chronic respiratory failure with hypoxia (HCC)   Chronic combined systolic and diastolic heart failure (HCC)   COPD, severe (HCC)   Chronic atrial fibrillation (HCC)   Metabolic encephalopathy   Pleural effusion, right   1. Acute on chronic respiratory failure hypoxia we will continue with baseline settings of 12/5 on  pressures 2. Chronic combined systolic diastolic heart failure we will continue present management 3. Severe COPD at baseline 4. Chronic atrial fibrillation rate is controlled 5. Metabolic encephalopathy no change 6. Pleural effusion status post thoracentesis   I have personally seen and evaluated the patient, evaluated laboratory and imaging results, formulated the assessment and plan and placed orders. The Patient requires high complexity decision making with multiple systems involvement.  Rounds were done with the Respiratory Therapy Director and Staff  therapists and discussed with nursing staff also.  Yevonne Pax, MD [facility] Pulmonary Critical Care Medicine Sleep Medicine

## 2020-10-18 DIAGNOSIS — I5042 Chronic combined systolic (congestive) and diastolic (congestive) heart failure: Secondary | ICD-10-CM | POA: Diagnosis not present

## 2020-10-18 DIAGNOSIS — J9621 Acute and chronic respiratory failure with hypoxia: Secondary | ICD-10-CM | POA: Diagnosis not present

## 2020-10-18 DIAGNOSIS — J449 Chronic obstructive pulmonary disease, unspecified: Secondary | ICD-10-CM | POA: Diagnosis not present

## 2020-10-18 DIAGNOSIS — I482 Chronic atrial fibrillation, unspecified: Secondary | ICD-10-CM | POA: Diagnosis not present

## 2020-10-18 LAB — CBC
HCT: 27.6 % — ABNORMAL LOW (ref 39.0–52.0)
Hemoglobin: 8.6 g/dL — ABNORMAL LOW (ref 13.0–17.0)
MCH: 29.8 pg (ref 26.0–34.0)
MCHC: 31.2 g/dL (ref 30.0–36.0)
MCV: 95.5 fL (ref 80.0–100.0)
Platelets: 215 10*3/uL (ref 150–400)
RBC: 2.89 MIL/uL — ABNORMAL LOW (ref 4.22–5.81)
RDW: 19 % — ABNORMAL HIGH (ref 11.5–15.5)
WBC: 6.5 10*3/uL (ref 4.0–10.5)
nRBC: 0 % (ref 0.0–0.2)

## 2020-10-18 LAB — BASIC METABOLIC PANEL
Anion gap: 13 (ref 5–15)
BUN: 123 mg/dL — ABNORMAL HIGH (ref 8–23)
CO2: 28 mmol/L (ref 22–32)
Calcium: 8.4 mg/dL — ABNORMAL LOW (ref 8.9–10.3)
Chloride: 90 mmol/L — ABNORMAL LOW (ref 98–111)
Creatinine, Ser: 1.75 mg/dL — ABNORMAL HIGH (ref 0.61–1.24)
GFR, Estimated: 40 mL/min — ABNORMAL LOW (ref >60)
Glucose, Bld: 103 mg/dL — ABNORMAL HIGH (ref 70–99)
Potassium: 4.7 mmol/L (ref 3.5–5.1)
Sodium: 131 mmol/L — ABNORMAL LOW (ref 135–145)

## 2020-10-18 NOTE — Progress Notes (Signed)
Pulmonary Critical Care Medicine [facility] [place]   PULMONARY CRITICAL CARE SERVICE  PROGRESS NOTE  Date of Service: 10/18/2020  Eric Knapp  YBO:[id number]  DOB: Sep 04, 1941   DOA: 09/12/2020  Referring Physician: Carron Curie, MD  HPI: Eric Knapp is a 79 y.o. male seen for follow up of Acute on Chronic Respiratory Failure. Patient is right now on pressure support currently is on 12/5 with good saturations  Medications: Reviewed on Rounds  Physical Exam:  Vitals: Temperature is 96.5 pulse 88 respiratory 18 blood pressure is 145/73 saturations 100%  Ventilator Settings on pressure support FiO2 35% pressure 12/5  . General: Comfortable at this time . Eyes: Grossly normal lids, irises & conjunctiva . ENT: grossly tongue is normal . Neck: no obvious mass . Cardiovascular: S1 S2 normal no gallop . Respiratory: Scattered rhonchi no rales are noted at this time . Abdomen: soft . Skin: no rash seen on limited exam . Musculoskeletal: not rigid . Psychiatric:unable to assess . Neurologic: no seizure no involuntary movements         Lab Data:   Basic Metabolic Panel: Recent Labs  Lab 10/13/20 0904 10/13/20 0904 10/14/20 1801 10/15/20 1245 10/16/20 0439 10/17/20 0845 10/18/20 0704  NA 138  --  132*  --  133* 134* 131*  K 5.6*   < > 5.6* 5.6* 5.0 4.7 4.7  CL 97*  --  93*  --  92* 93* 90*  CO2 31  --  29  --  30 34* 28  GLUCOSE 221*  --  203*  --  175* 78 103*  BUN 118*  --  127*  --  127* 127* 123*  CREATININE 1.82*  --  1.99*  --  1.84* 1.75* 1.75*  CALCIUM 8.7*  --  8.4*  --  8.5* 7.7* 8.4*  MG 2.1  --   --   --   --   --   --    < > = values in this interval not displayed.    ABG: No results for input(s): PHART, PCO2ART, PO2ART, HCO3, O2SAT in the last 168 hours.  Liver Function Tests: No results for input(s): AST, ALT, ALKPHOS, BILITOT, PROT, ALBUMIN in the last 168 hours. No results for input(s): LIPASE, AMYLASE in the last 168 hours. No results  for input(s): AMMONIA in the last 168 hours.  CBC: Recent Labs  Lab 10/13/20 0904 10/16/20 0439 10/18/20 0704  WBC 7.2 6.4 6.5  HGB 8.8* 8.8* 8.6*  HCT 30.4* 29.7* 27.6*  MCV 101.0* 98.0 95.5  PLT 212 236 215    Cardiac Enzymes: No results for input(s): CKTOTAL, CKMB, CKMBINDEX, TROPONINI in the last 168 hours.  BNP (last 3 results) Recent Labs    09/04/20 1646 09/05/20 1007 10/05/20 1046  BNP 389.9* 458.9* 637.5*    ProBNP (last 3 results) No results for input(s): PROBNP in the last 8760 hours.  Radiological Exams: No results found.  Assessment/Plan Active Problems:   Acute on chronic respiratory failure with hypoxia (HCC)   Chronic combined systolic and diastolic heart failure (HCC)   COPD, severe (HCC)   Chronic atrial fibrillation (HCC)   Metabolic encephalopathy   Pleural effusion, right   1. Acute on chronic respiratory failure hypoxia we will continue with pressure support 12/5 patient is at baseline.  Home ventilator is supposed to be arranged 2. Severe COPD continue with supportive care 3. Combined systolic diastolic heart failure monitor fluid status continue with supportive care 4. Chronic atrial fibrillation rate is  controlled 5. Metabolic encephalopathy at baseline 6. Pleural effusion status post thoracentesis   I have personally seen and evaluated the patient, evaluated laboratory and imaging results, formulated the assessment and plan and placed orders. The Patient requires high complexity decision making with multiple systems involvement.  Rounds were done with the Respiratory Therapy Director and Staff therapists and discussed with nursing staff also.  Yevonne Pax, MD [facility] Pulmonary Critical Care Medicine Sleep Medicine

## 2020-10-19 ENCOUNTER — Other Ambulatory Visit (HOSPITAL_COMMUNITY): Payer: Medicare Other

## 2020-10-19 DIAGNOSIS — I482 Chronic atrial fibrillation, unspecified: Secondary | ICD-10-CM | POA: Diagnosis not present

## 2020-10-19 DIAGNOSIS — J449 Chronic obstructive pulmonary disease, unspecified: Secondary | ICD-10-CM | POA: Diagnosis not present

## 2020-10-19 DIAGNOSIS — I5042 Chronic combined systolic (congestive) and diastolic (congestive) heart failure: Secondary | ICD-10-CM | POA: Diagnosis not present

## 2020-10-19 DIAGNOSIS — J9621 Acute and chronic respiratory failure with hypoxia: Secondary | ICD-10-CM | POA: Diagnosis not present

## 2020-10-19 LAB — BASIC METABOLIC PANEL
Anion gap: 14 (ref 5–15)
BUN: 122 mg/dL — ABNORMAL HIGH (ref 8–23)
CO2: 27 mmol/L (ref 22–32)
Calcium: 8.6 mg/dL — ABNORMAL LOW (ref 8.9–10.3)
Chloride: 89 mmol/L — ABNORMAL LOW (ref 98–111)
Creatinine, Ser: 1.76 mg/dL — ABNORMAL HIGH (ref 0.61–1.24)
GFR, Estimated: 40 mL/min — ABNORMAL LOW (ref >60)
Glucose, Bld: 143 mg/dL — ABNORMAL HIGH (ref 70–99)
Potassium: 4.4 mmol/L (ref 3.5–5.1)
Sodium: 130 mmol/L — ABNORMAL LOW (ref 135–145)

## 2020-10-19 LAB — BLOOD GAS, ARTERIAL
Acid-Base Excess: 6.9 mmol/L — ABNORMAL HIGH (ref 0.0–2.0)
Bicarbonate: 32.5 mmol/L — ABNORMAL HIGH (ref 20.0–28.0)
FIO2: 35
O2 Saturation: 96.6 %
Patient temperature: 37
pCO2 arterial: 61.6 mmHg — ABNORMAL HIGH (ref 32.0–48.0)
pH, Arterial: 7.343 — ABNORMAL LOW (ref 7.350–7.450)
pO2, Arterial: 85.2 mmHg (ref 83.0–108.0)

## 2020-10-19 LAB — PHOSPHORUS: Phosphorus: 4.6 mg/dL (ref 2.5–4.6)

## 2020-10-19 LAB — TROPONIN I (HIGH SENSITIVITY): Troponin I (High Sensitivity): 27 ng/L — ABNORMAL HIGH (ref <18)

## 2020-10-19 LAB — PROTIME-INR
INR: 1.1 (ref 0.8–1.2)
Prothrombin Time: 13.9 seconds (ref 11.4–15.2)

## 2020-10-19 LAB — MAGNESIUM: Magnesium: 2.1 mg/dL (ref 1.7–2.4)

## 2020-10-19 NOTE — Progress Notes (Signed)
Pulmonary Critical Care Medicine [facility] [place]   PULMONARY CRITICAL CARE SERVICE  PROGRESS NOTE  Date of Service: 10/19/2020  Eric Knapp  BTD:[id number]  DOB: [birth year]   DOA: 14-Sep-2020  Referring Physician: Carron Curie, MD  HPI: Eric Knapp is a 79 y.o. male seen for follow up of Acute on Chronic Respiratory Failure.  Patient remains on the ventilator apparently this morning he had a rapid response right now is back on assist control mode  Medications: Reviewed on Rounds  Physical Exam:  Vitals: Temperature is 96.6 pulse 96 respiratory rate 21 blood pressure is 148/95 saturations 98%  Ventilator Settings on assist control FiO2 35% tidal line 500 with a PEEP of 5   General: Comfortable at this time  Eyes: Grossly normal lids, irises & conjunctiva  ENT: grossly tongue is normal  Neck: no obvious mass  Cardiovascular: S1 S2 normal no gallop  Respiratory: No rhonchi very coarse breath sounds  Abdomen: soft  Skin: no rash seen on limited exam  Musculoskeletal: not rigid  Psychiatric:unable to assess  Neurologic: no seizure no involuntary movements         Lab Data:   Basic Metabolic Panel: Recent Labs  Lab 10/13/20 0904 10/13/20 0904 10/14/20 1801 10/14/20 1801 10/15/20 1245 10/16/20 0439 10/17/20 0845 10/18/20 0704 10/19/20 0624  NA 138   < > 132*  --   --  133* 134* 131* 130*  K 5.6*   < > 5.6*   < > 5.6* 5.0 4.7 4.7 4.4  CL 97*   < > 93*  --   --  92* 93* 90* 89*  CO2 31   < > 29  --   --  30 34* 28 27  GLUCOSE 221*   < > 203*  --   --  175* 78 103* 143*  BUN 118*   < > 127*  --   --  127* 127* 123* 122*  CREATININE 1.82*   < > 1.99*  --   --  1.84* 1.75* 1.75* 1.76*  CALCIUM 8.7*   < > 8.4*  --   --  8.5* 7.7* 8.4* 8.6*  MG 2.1  --   --   --   --   --   --   --  2.1  PHOS  --   --   --   --   --   --   --   --  4.6   < > = values in this interval not displayed.    ABG: No results for input(s): PHART, PCO2ART, PO2ART, HCO3,  O2SAT in the last 168 hours.  Liver Function Tests: No results for input(s): AST, ALT, ALKPHOS, BILITOT, PROT, ALBUMIN in the last 168 hours. No results for input(s): LIPASE, AMYLASE in the last 168 hours. No results for input(s): AMMONIA in the last 168 hours.  CBC: Recent Labs  Lab 10/13/20 0904 10/16/20 0439 10/18/20 0704  WBC 7.2 6.4 6.5  HGB 8.8* 8.8* 8.6*  HCT 30.4* 29.7* 27.6*  MCV 101.0* 98.0 95.5  PLT 212 236 215    Cardiac Enzymes: No results for input(s): CKTOTAL, CKMB, CKMBINDEX, TROPONINI in the last 168 hours.  BNP (last 3 results) Recent Labs    09/04/20 1646 09/05/20 1007 10/05/20 1046  BNP 389.9* 458.9* 637.5*    ProBNP (last 3 results) No results for input(s): PROBNP in the last 8760 hours.  Radiological Exams: DG Chest Port 1 View  Result Date: 10/19/2020 CLINICAL DATA:  Desaturations.  Respiratory failure. EXAM: PORTABLE CHEST 1 VIEW COMPARISON:  10/13/2020 FINDINGS: There is a tracheostomy tube with tip above the carina. Cardiac enlargement. Mild interstitial edema. Right pleural effusion, unchanged. Interval decrease in left effusion. Bilateral hazy lung opacities have improved from previous exam. IMPRESSION: 1. Improving aeration to both lungs. 2. Persistent right pleural effusion. With interval decrease in left pleural effusion. Electronically Signed   By: Signa Kell M.D.   On: 10/19/2020 06:30    Assessment/Plan Active Problems:   Acute on chronic respiratory failure with hypoxia (HCC)   Chronic combined systolic and diastolic heart failure (HCC)   COPD, severe (HCC)   Chronic atrial fibrillation (HCC)   Metabolic encephalopathy   Pleural effusion, right   1. Acute on chronic respiratory failure hypoxia we will continue with full support on assist control right now 35% FiO2.  Patient's x-ray looks somewhat improved still with very significant cardiomegaly recommended getting a echocardiogram follow-up 2. Chronic combined systolic  diastolic heart failure echocardiogram has been ordered we will also get cardiology evaluation to stabilize the patient 3. Severe COPD medical management 4. Chronic atrial fibrillation rate is controlled continue with supportive care. 5. Metabolic encephalopathy no change 6. Pleural effusion overall the chest x-ray shows some decrease on the left side and persistence on the right side   I have personally seen and evaluated the patient, evaluated laboratory and imaging results, formulated the assessment and plan and placed orders. The Patient requires high complexity decision making with multiple systems involvement.  Rounds were done with the Respiratory Therapy Director and Staff therapists and discussed with nursing staff also.  Yevonne Pax, MD [facility] Pulmonary Critical Care Medicine Sleep Medicine

## 2020-10-20 ENCOUNTER — Other Ambulatory Visit (HOSPITAL_COMMUNITY): Payer: Medicare Other

## 2020-10-20 DIAGNOSIS — I482 Chronic atrial fibrillation, unspecified: Secondary | ICD-10-CM | POA: Diagnosis not present

## 2020-10-20 DIAGNOSIS — I5042 Chronic combined systolic (congestive) and diastolic (congestive) heart failure: Secondary | ICD-10-CM | POA: Diagnosis not present

## 2020-10-20 DIAGNOSIS — J9621 Acute and chronic respiratory failure with hypoxia: Secondary | ICD-10-CM | POA: Diagnosis not present

## 2020-10-20 DIAGNOSIS — J449 Chronic obstructive pulmonary disease, unspecified: Secondary | ICD-10-CM | POA: Diagnosis not present

## 2020-10-20 LAB — ECHOCARDIOGRAM COMPLETE
Area-P 1/2: 5.38 cm2
Calc EF: 37.4 %
S' Lateral: 3.8 cm
Single Plane A2C EF: 44 %
Single Plane A4C EF: 30.3 %

## 2020-10-20 LAB — CBC
HCT: 25.6 % — ABNORMAL LOW (ref 39.0–52.0)
Hemoglobin: 8 g/dL — ABNORMAL LOW (ref 13.0–17.0)
MCH: 29.5 pg (ref 26.0–34.0)
MCHC: 31.3 g/dL (ref 30.0–36.0)
MCV: 94.5 fL (ref 80.0–100.0)
Platelets: 233 10*3/uL (ref 150–400)
RBC: 2.71 MIL/uL — ABNORMAL LOW (ref 4.22–5.81)
RDW: 18.6 % — ABNORMAL HIGH (ref 11.5–15.5)
WBC: 5.6 10*3/uL (ref 4.0–10.5)
nRBC: 0 % (ref 0.0–0.2)

## 2020-10-20 LAB — BASIC METABOLIC PANEL
Anion gap: 10 (ref 5–15)
BUN: 121 mg/dL — ABNORMAL HIGH (ref 8–23)
CO2: 30 mmol/L (ref 22–32)
Calcium: 8.2 mg/dL — ABNORMAL LOW (ref 8.9–10.3)
Chloride: 85 mmol/L — ABNORMAL LOW (ref 98–111)
Creatinine, Ser: 2.04 mg/dL — ABNORMAL HIGH (ref 0.61–1.24)
GFR, Estimated: 33 mL/min — ABNORMAL LOW (ref >60)
Glucose, Bld: 217 mg/dL — ABNORMAL HIGH (ref 70–99)
Potassium: 4.6 mmol/L (ref 3.5–5.1)
Sodium: 125 mmol/L — ABNORMAL LOW (ref 135–145)

## 2020-10-20 NOTE — Consult Note (Signed)
Ref: Carron Curie, MD   Subjective:  Awake.  Monitor shows atrial fibrillation with controlled ventricular response since on IV amiodarone drip.  He had non-sustained VT prior to this. Echocardiogram shows inferior wall hypokinesia with 40-45 % EF, Moderate MR and TR.  Objective:  Vital Signs in the last 24 hours:  P: 60 R: 24 BP: 111/64 O2 sat 98 % on 35 % FiO2.  Physical Exam: BP Readings from Last 1 Encounters:  09/07/20 (!) 92/39     Wt Readings from Last 1 Encounters:  No data found for Wt    Weight change:  There is no height or weight on file to calculate BMI. HEENT: [place]/AT, Eyes-Blue, Conjunctiva-Pale, Sclera-Non-icteric Neck: No JVD, No bruit, Trachea midline. Lungs:  Clearing, Bilateral. Cardiac:  Irregular rhythm, normal S1 and S2, no S3. II/VI systolic murmur. Abdomen:  Soft, non-tender. BS present. Extremities:  2 + edema present. No cyanosis. No clubbing. CNS: AxOx1, Cranial nerves grossly intact.  Skin: Warm and dry.   Intake/Output from previous day: No intake/output data recorded.    Lab Results: BMET    Component Value Date/Time   NA 125 (L) 10/20/2020 0434   NA 130 (L) 10/19/2020 0624   NA 131 (L) 10/18/2020 0704   K 4.6 10/20/2020 0434   K 4.4 10/19/2020 0624   K 4.7 10/18/2020 0704   CL 85 (L) 10/20/2020 0434   CL 89 (L) 10/19/2020 0624   CL 90 (L) 10/18/2020 0704   CO2 30 10/20/2020 0434   CO2 27 10/19/2020 0624   CO2 28 10/18/2020 0704   GLUCOSE 217 (H) 10/20/2020 0434   GLUCOSE 143 (H) 10/19/2020 0624   GLUCOSE 103 (H) 10/18/2020 0704   BUN 121 (H) 10/20/2020 0434   BUN 122 (H) 10/19/2020 0624   BUN 123 (H) 10/18/2020 0704   CREATININE 2.04 (H) 10/20/2020 0434   CREATININE 1.76 (H) 10/19/2020 0624   CREATININE 1.75 (H) 10/18/2020 0704   CALCIUM 8.2 (L) 10/20/2020 0434   CALCIUM 8.6 (L) 10/19/2020 0624   CALCIUM 8.4 (L) 10/18/2020 0704   GFRNONAA 33 (L) 10/20/2020 0434   GFRNONAA 40 (L) 10/19/2020 0624   GFRNONAA 40 (L)  10/18/2020 0704   GFRAA 39 (L) 09/22/2020 0604   GFRAA 41 (L) 09/20/2020 0438   GFRAA 42 (L) 09/18/2020 0828   CBC    Component Value Date/Time   WBC 5.6 10/20/2020 0434   RBC 2.71 (L) 10/20/2020 0434   HGB 8.0 (L) 10/20/2020 0434   HCT 25.6 (L) 10/20/2020 0434   PLT 233 10/20/2020 0434   MCV 94.5 10/20/2020 0434   MCH 29.5 10/20/2020 0434   MCHC 31.3 10/20/2020 0434   RDW 18.6 (H) 10/20/2020 0434   HEPATIC Function Panel Recent Labs    09/01/20 0955  PROT 6.4*   HEMOGLOBIN A1C No components found for: HGA1C,  MPG CARDIAC ENZYMES No results found for: CKTOTAL, CKMB, CKMBINDEX, TROPONINI BNP No results for input(s): PROBNP in the last 8760 hours. TSH No results for input(s): TSH in the last 8760 hours. CHOLESTEROL No results for input(s): CHOL in the last 8760 hours.  Scheduled Meds: Continuous Infusions: PRN Meds:.  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 May need lower dose as heart rate drops below 60/min. Not a candidate for anticoagulation  due to recurrent GI bleed.   LOS: 0 days   Time spent including chart review, lab review, examination, discussion with patient/nurse and referring doctor : 30 min   Orpah Cobb  MD  10/20/2020, 5:58 PM

## 2020-10-20 NOTE — Progress Notes (Signed)
[facility] [place]   PULMONARY CRITICAL CARE SERVICE  PROGRESS NOTE  Date of Service: 10/20/2020  Eric HamHarl Kueker  RUE:[id number]  DOB: May 23, 1941   DOA: November 09, 2020  Referring Physician: Carron CurieAli Hijazi, MD  HPI: Eric Knapp is a 79 y.o. male seen for follow up of Acute on Chronic Respiratory Failure.  Patient currently is on pressure support 12/5 at baseline awaiting home drainage and discharge home  Medications: Reviewed on Rounds  Physical Exam:  Vitals: Temperature is 96.6 pulse 56 respiratory rate is 28 blood pressure is 116/68 saturations 98%  Ventilator Settings on pressure support FiO2 is 35% 12/5  . General: Comfortable at this time . Eyes: Grossly normal lids, irises & conjunctiva . ENT: grossly tongue is normal . Neck: no obvious mass . Cardiovascular: S1 S2 normal no gallop . Respiratory: No rhonchi no rales noted at this time . Abdomen: soft . Skin: no rash seen on limited exam . Musculoskeletal: not rigid . Psychiatric:unable to assess . Neurologic: no seizure no involuntary movements         Lab Data:   Basic Metabolic Panel: Recent Labs  Lab 10/16/20 0439 10/17/20 0845 10/18/20 0704 10/19/20 0624 10/20/20 0434  NA 133* 134* 131* 130* 125*  K 5.0 4.7 4.7 4.4 4.6  CL 92* 93* 90* 89* 85*  CO2 30 34* 28 27 30   GLUCOSE 175* 78 103* 143* 217*  BUN 127* 127* 123* 122* 121*  CREATININE 1.84* 1.75* 1.75* 1.76* 2.04*  CALCIUM 8.5* 7.7* 8.4* 8.6* 8.2*  MG  --   --   --  2.1  --   PHOS  --   --   --  4.6  --     ABG: Recent Labs  Lab 10/19/20 0915  PHART 7.343*  PCO2ART 61.6*  PO2ART 85.2  HCO3 32.5*  O2SAT 96.6    Liver Function Tests: No results for input(s): AST, ALT, ALKPHOS, BILITOT, PROT, ALBUMIN in the last 168 hours. No results for input(s): LIPASE, AMYLASE in the last 168 hours. No results for input(s): AMMONIA in the last 168 hours.  CBC: Recent Labs  Lab 10/16/20 0439 10/18/20 0704  10/20/20 0434  WBC 6.4 6.5 5.6  HGB 8.8* 8.6* 8.0*  HCT 29.7* 27.6* 25.6*  MCV 98.0 95.5 94.5  PLT 236 215 233    Cardiac Enzymes: No results for input(s): CKTOTAL, CKMB, CKMBINDEX, TROPONINI in the last 168 hours.  BNP (last 3 results) Recent Labs    09/04/20 1646 09/05/20 1007 10/05/20 1046  BNP 389.9* 458.9* 637.5*    ProBNP (last 3 results) No results for input(s): PROBNP in the last 8760 hours.  Radiological Exams: CT ABDOMEN PELVIS WO CONTRAST  Result Date: 10/20/2020 CLINICAL DATA:  Nausea, vomiting EXAM: CT ABDOMEN AND PELVIS WITHOUT CONTRAST TECHNIQUE: Multidetector CT imaging of the abdomen and pelvis was performed following the standard protocol without IV contrast. COMPARISON:  09/05/2020 FINDINGS: Lower chest: Moderate bilateral pleural effusions. Compressive atelectasis in the lower lobes. Coronary artery calcifications in the visualized right coronary artery. Hepatobiliary: Percutaneous cholecystostomy tube remains in place, unchanged. Gallbladder is decompressed. Pneumobilia present. Common bile duct stent remains in place, unchanged. No focal hepatic abnormality. Pancreas: No focal abnormality or ductal dilatation. Spleen: No focal abnormality.  Normal size. Adrenals/Urinary Tract: Foley catheter in place with bladder decompressed. No hydronephrosis. No renal or adrenal mass. Stomach/Bowel: Gastrostomy tube within the stomach, unchanged. Stomach, large and small bowel grossly unremarkable. Vascular/Lymphatic: Aortic atherosclerosis. No evidence of aneurysm or adenopathy.  Reproductive: No visible focal abnormality. Other: Small amount of free fluid in the cul-de-sac. Musculoskeletal: No acute bony abnormality. IMPRESSION: Moderate bilateral pleural effusions. Compressive atelectasis in the lower lobes. Small amount of free fluid in the cul-de-sac. Percutaneous cholecystostomy tube remains in place with decompressed gallbladder. Common bile duct stent is unchanged.  Pneumobilia. Aortic atherosclerosis. Electronically Signed   By: Charlett Nose M.D.   On: 10/20/2020 00:16   DG Chest Port 1 View  Result Date: 10/19/2020 CLINICAL DATA:  Desaturations.  Respiratory failure. EXAM: PORTABLE CHEST 1 VIEW COMPARISON:  10/13/2020 FINDINGS: There is a tracheostomy tube with tip above the carina. Cardiac enlargement. Mild interstitial edema. Right pleural effusion, unchanged. Interval decrease in left effusion. Bilateral hazy lung opacities have improved from previous exam. IMPRESSION: 1. Improving aeration to both lungs. 2. Persistent right pleural effusion. With interval decrease in left pleural effusion. Electronically Signed   By: Signa Kell M.D.   On: 10/19/2020 06:30   ECHOCARDIOGRAM COMPLETE  Result Date: 10/20/2020    ECHOCARDIOGRAM REPORT   Patient Name:   Eric Knapp Date of Exam: 10/20/2020 Medical Rec #:  [id number]  Height: Accession #:    [id number] Weight: Date of Birth:  04-29-1941 BSA: Patient Age:    78 years   BP:           92/39 mmHg Patient Gender: M          HR:           64 bpm. Exam Location:  Inpatient Procedure: 2D Echo, Color Doppler and Cardiac Doppler Indications:    R94.31 Abnormal EKG  History:        Patient has no prior history of Echocardiogram examinations.                 CHF, CAD, COPD, Arrythmias:Atrial Fibrillation; Risk                 Factors:Renal failure.  Sonographer:    Irving Burton Senior RDCS Referring Phys: [phone] Judea Riches A Traeton Bordas  Sonographer Comments: Very technically difficult study due to significant anasarca. Scanned supine on artificial respirator. IMPRESSIONS  1. Left ventricular ejection fraction, by estimation, is 35 to 40%. The left ventricle has moderately decreased function. The left ventricle demonstrates regional wall motion abnormalities (see scoring diagram/findings for description). There is mild concentric left ventricular hypertrophy. Left ventricular diastolic parameters are indeterminate. There is moderate hypokinesis of  the left ventricular, basal-mid inferior wall.  2. Right ventricular systolic function is moderately reduced. The right ventricular size is mildly enlarged. There is moderately elevated pulmonary artery systolic pressure.  3. Left atrial size was moderately dilated.  4. Right atrial size was mild to moderately dilated.  5. The mitral valve is degenerative. Moderate mitral valve regurgitation. No evidence of mitral stenosis.  6. Tricuspid valve regurgitation is moderate.  7. The aortic valve is tricuspid. There is mild calcification of the aortic valve. There is mild thickening of the aortic valve. Aortic valve regurgitation is not visualized. Mild aortic valve sclerosis is present, with no evidence of aortic valve stenosis.  8. The inferior vena cava is dilated in size with <50% respiratory variability, suggesting right atrial pressure of 15 mmHg. FINDINGS  Left Ventricle: Left ventricular ejection fraction, by estimation, is 35 to 40%. The left ventricle has moderately decreased function. The left ventricle demonstrates regional wall motion abnormalities. Moderate hypokinesis of the left ventricular, basal-mid inferior wall. The left ventricular internal cavity size was normal in size. There is mild concentric  left ventricular hypertrophy. Left ventricular diastolic parameters are indeterminate.  LV Wall Scoring: The entire inferior wall, posterior wall, and apex are hypokinetic. The entire anterior wall, antero-lateral wall, entire septum, and apical lateral segment are normal. Right Ventricle: The right ventricular size is mildly enlarged. No increase in right ventricular wall thickness. Right ventricular systolic function is moderately reduced. There is moderately elevated pulmonary artery systolic pressure. The tricuspid regurgitant velocity is 3.07 m/s, and with an assumed right atrial pressure of 15 mmHg, the estimated right ventricular systolic pressure is 52.7 mmHg. Left Atrium: Left atrial size was  moderately dilated. Right Atrium: Right atrial size was mild to moderately dilated. Pericardium: There is no evidence of pericardial effusion. Mitral Valve: The mitral valve is degenerative in appearance. There is mild thickening of the mitral valve leaflet(s). There is mild calcification of the mitral valve leaflet(s). Mild mitral annular calcification. Moderate mitral valve regurgitation. No evidence of mitral valve stenosis. Tricuspid Valve: The tricuspid valve is normal in structure. Tricuspid valve regurgitation is moderate. Aortic Valve: The aortic valve is tricuspid. There is mild calcification of the aortic valve. There is mild thickening of the aortic valve. Aortic valve regurgitation is not visualized. Mild aortic valve sclerosis is present, with no evidence of aortic valve stenosis. Pulmonic Valve: The pulmonic valve was normal in structure. Pulmonic valve regurgitation is not visualized. Aorta: The aortic root is normal in size and structure. There is minimal (Grade I) atheroma plaque. Venous: The inferior vena cava is dilated in size with less than 50% respiratory variability, suggesting right atrial pressure of 15 mmHg. IAS/Shunts: The interatrial septum was not assessed.  LEFT VENTRICLE PLAX 2D LVIDd:         4.30 cm     Diastology LVIDs:         3.80 cm     LV e' medial:    5.55 cm/s LV PW:         1.30 cm     LV E/e' medial:  16.8 LV IVS:        1.10 cm     LV e' lateral:   8.49 cm/s LVOT diam:     2.10 cm     LV E/e' lateral: 11.0 LV SV:         43 LVOT Area:     3.46 cm  LV Volumes (MOD) LV vol d, MOD A2C: 90.0 ml LV vol d, MOD A4C: 85.2 ml LV vol s, MOD A2C: 50.4 ml LV vol s, MOD A4C: 59.4 ml LV SV MOD A2C:     39.6 ml LV SV MOD A4C:     85.2 ml LV SV MOD BP:      32.9 ml RIGHT VENTRICLE RV S prime:     6.64 cm/s TAPSE (M-mode): 1.3 cm LEFT ATRIUM           RIGHT ATRIUM LA diam:      5.00 cm RA Area:     18.40 cm LA Vol (A2C): 74.8 ml RA Volume:   48.90 ml LA Vol (A4C): 54.3 ml  AORTIC VALVE  LVOT Vmax:   60.10 cm/s LVOT Vmean:  41.100 cm/s LVOT VTI:    0.125 m  AORTA Ao Root diam: 3.00 cm MITRAL VALVE               TRICUSPID VALVE MV Area (PHT): 5.38 cm    TR Peak grad:   37.7 mmHg MV Decel Time: 141 msec    TR Vmax:  307.00 cm/s MV E velocity: 93.40 cm/s MV A velocity: 29.10 cm/s  SHUNTS MV E/A ratio:  3.21        Systemic VTI:  0.12 m                            Systemic Diam: 2.10 cm Orpah Cobb MD Electronically signed by Orpah Cobb MD Signature Date/Time: 10/20/2020/3:05:56 PM    Final     Assessment/Plan Active Problems:   Acute on chronic respiratory failure with hypoxia (HCC)   Chronic combined systolic and diastolic heart failure (HCC)   COPD, severe (HCC)   Chronic atrial fibrillation (HCC)   Metabolic encephalopathy   Pleural effusion, right   1. Acute on chronic respiratory failure with hypoxia we will continue with current management patient's home training with the ventilator for the family 2. Chronic combined systolic diastolic heart failure is at baseline prognosis guarded 3. Severe COPD no change 4. Chronic atrial fibrillation rate is controlled 5. Metabolic encephalopathy no change 6. Pleural effusion we will continue with supportive care   I have personally seen and evaluated the patient, evaluated laboratory and imaging results, formulated the assessment and plan and placed orders. The Patient requires high complexity decision making with multiple systems involvement.  Rounds were done with the Respiratory Therapy Director and Staff therapists and discussed with nursing staff also.  Yevonne Pax, MD [facility] Pulmonary Critical Care Medicine Sleep Medicine

## 2020-10-20 NOTE — Progress Notes (Signed)
Echocardiogram 2D Echocardiogram has been performed.  Eric Knapp 10/20/2020, 2:32 PM

## 2020-10-21 LAB — BASIC METABOLIC PANEL
Anion gap: 14 (ref 5–15)
BUN: 134 mg/dL — ABNORMAL HIGH (ref 8–23)
CO2: 26 mmol/L (ref 22–32)
Calcium: 8.1 mg/dL — ABNORMAL LOW (ref 8.9–10.3)
Chloride: 83 mmol/L — ABNORMAL LOW (ref 98–111)
Creatinine, Ser: 2.42 mg/dL — ABNORMAL HIGH (ref 0.61–1.24)
GFR, Estimated: 27 mL/min — ABNORMAL LOW (ref >60)
Glucose, Bld: 53 mg/dL — ABNORMAL LOW (ref 70–99)
Potassium: 4.5 mmol/L (ref 3.5–5.1)
Sodium: 123 mmol/L — ABNORMAL LOW (ref 135–145)

## 2020-10-21 LAB — MAGNESIUM: Magnesium: 2.3 mg/dL (ref 1.7–2.4)

## 2020-10-21 LAB — CBC
HCT: 27.4 % — ABNORMAL LOW (ref 39.0–52.0)
Hemoglobin: 8.8 g/dL — ABNORMAL LOW (ref 13.0–17.0)
MCH: 29.6 pg (ref 26.0–34.0)
MCHC: 32.1 g/dL (ref 30.0–36.0)
MCV: 92.3 fL (ref 80.0–100.0)
Platelets: 298 10*3/uL (ref 150–400)
RBC: 2.97 MIL/uL — ABNORMAL LOW (ref 4.22–5.81)
RDW: 18.5 % — ABNORMAL HIGH (ref 11.5–15.5)
WBC: 6.5 10*3/uL (ref 4.0–10.5)
nRBC: 0 % (ref 0.0–0.2)

## 2020-10-21 NOTE — Consult Note (Signed)
Ref: Carron Curie, MD   Subjective:  Awakens easily. Atrial fibrillation with HR in 60-70/min.  Objective:  Vital Signs in the last 24 hours:  P:60-70 R: 17 BP:129/69 O2 sat: 96 % on 30 % FiO2.  Physical Exam: BP Readings from Last 1 Encounters:  09/07/20 (!) 92/39     Wt Readings from Last 1 Encounters:  No data found for Wt    Weight change:  There is no height or weight on file to calculate BMI. HEENT: [place]/AT, Eyes-Blue, Conjunctiva-Pale, Sclera-Non-icteric Neck: No JVD, No bruit, Trachea midline. Lungs:  Clearing, Bilateral. Cardiac:  Regular rhythm, normal S1 and S2, no S3.I II/VI systolic murmur. Abdomen:  Soft, non-tender. BS present. Extremities:  1+ to 2 + edema present. No cyanosis. No clubbing. CNS: AxOx1, Cranial nerves grossly intact.  Skin: Warm and dry.   Intake/Output from previous day: No intake/output data recorded.    Lab Results: BMET    Component Value Date/Time   NA 123 (L) 10/21/2020 0342   NA 125 (L) 10/20/2020 0434   NA 130 (L) 10/19/2020 0624   K 4.5 10/21/2020 0342   K 4.6 10/20/2020 0434   K 4.4 10/19/2020 0624   CL 83 (L) 10/21/2020 0342   CL 85 (L) 10/20/2020 0434   CL 89 (L) 10/19/2020 0624   CO2 26 10/21/2020 0342   CO2 30 10/20/2020 0434   CO2 27 10/19/2020 0624   GLUCOSE 53 (L) 10/21/2020 0342   GLUCOSE 217 (H) 10/20/2020 0434   GLUCOSE 143 (H) 10/19/2020 0624   BUN 134 (H) 10/21/2020 0342   BUN 121 (H) 10/20/2020 0434   BUN 122 (H) 10/19/2020 0624   CREATININE 2.42 (H) 10/21/2020 0342   CREATININE 2.04 (H) 10/20/2020 0434   CREATININE 1.76 (H) 10/19/2020 0624   CALCIUM 8.1 (L) 10/21/2020 0342   CALCIUM 8.2 (L) 10/20/2020 0434   CALCIUM 8.6 (L) 10/19/2020 0624   GFRNONAA 27 (L) 10/21/2020 0342   GFRNONAA 33 (L) 10/20/2020 0434   GFRNONAA 40 (L) 10/19/2020 0624   GFRAA 39 (L) 09/22/2020 0604   GFRAA 41 (L) 09/20/2020 0438   GFRAA 42 (L) 09/18/2020 0828   CBC    Component Value Date/Time   WBC 6.5 10/21/2020 0342    RBC 2.97 (L) 10/21/2020 0342   HGB 8.8 (L) 10/21/2020 0342   HCT 27.4 (L) 10/21/2020 0342   PLT 298 10/21/2020 0342   MCV 92.3 10/21/2020 0342   MCH 29.6 10/21/2020 0342   MCHC 32.1 10/21/2020 0342   RDW 18.5 (H) 10/21/2020 0342   HEPATIC Function Panel Recent Labs    09/01/20 0955  PROT 6.4*   HEMOGLOBIN A1C No components found for: HGA1C,  MPG CARDIAC ENZYMES No results found for: CKTOTAL, CKMB, CKMBINDEX, TROPONINI BNP No results for input(s): PROBNP in the last 8760 hours. TSH No results for input(s): TSH in the last 8760 hours. CHOLESTEROL No results for input(s): CHOL in the last 8760 hours.  Scheduled Meds: Continuous Infusions: PRN Meds:.  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 Consider oral amiodarone when oral/tube intake is safe.   LOS: 0 days   Time spent including chart review, lab review, examination, discussion with patient/Nurse : 30 min   Orpah Cobb  MD  10/21/2020, 9:11 AM

## 2020-10-22 DIAGNOSIS — I5042 Chronic combined systolic (congestive) and diastolic (congestive) heart failure: Secondary | ICD-10-CM | POA: Diagnosis not present

## 2020-10-22 DIAGNOSIS — J449 Chronic obstructive pulmonary disease, unspecified: Secondary | ICD-10-CM | POA: Diagnosis not present

## 2020-10-22 DIAGNOSIS — J9621 Acute and chronic respiratory failure with hypoxia: Secondary | ICD-10-CM | POA: Diagnosis not present

## 2020-10-22 DIAGNOSIS — I482 Chronic atrial fibrillation, unspecified: Secondary | ICD-10-CM | POA: Diagnosis not present

## 2020-10-22 NOTE — Progress Notes (Signed)
Pulmonary Critical Care Medicine [facility] [place]   PULMONARY CRITICAL CARE SERVICE  PROGRESS NOTE  Date of Service: 10/22/2020  Eric Knapp  RFF:[id number]  DOB: [birth year]   DOA: 09/19/20  Referring Physician: Carron Curie, MD  HPI: Eric Knapp is a 79 y.o. male seen for follow up of Acute on Chronic Respiratory Failure.  Patient currently is on pressure support awaiting home training and discharge planning.  Medications: Reviewed on Rounds  Physical Exam:  Vitals: Temperature is 96.1 pulse 61 respiratory rate is 18 blood pressure is 122/77 saturations 98%  Ventilator Settings on pressure support FiO2 is 35% pressure is 12/5  . General: Comfortable at this time . Eyes: Grossly normal lids, irises & conjunctiva . ENT: grossly tongue is normal . Neck: no obvious mass . Cardiovascular: S1 S2 normal no gallop . Respiratory: Scattered rhonchi very coarse breath sounds . Abdomen: soft . Skin: no rash seen on limited exam . Musculoskeletal: not rigid . Psychiatric:unable to assess . Neurologic: no seizure no involuntary movements         Lab Data:   Basic Metabolic Panel: Recent Labs  Lab 10/17/20 0845 10/18/20 0704 10/19/20 0624 10/20/20 0434 10/21/20 0342  NA 134* 131* 130* 125* 123*  K 4.7 4.7 4.4 4.6 4.5  CL 93* 90* 89* 85* 83*  CO2 34* 28 27 30 26   GLUCOSE 78 103* 143* 217* 53*  BUN 127* 123* 122* 121* 134*  CREATININE 1.75* 1.75* 1.76* 2.04* 2.42*  CALCIUM 7.7* 8.4* 8.6* 8.2* 8.1*  MG  --   --  2.1  --  2.3  PHOS  --   --  4.6  --   --     ABG: Recent Labs  Lab 10/19/20 0915  PHART 7.343*  PCO2ART 61.6*  PO2ART 85.2  HCO3 32.5*  O2SAT 96.6    Liver Function Tests: No results for input(s): AST, ALT, ALKPHOS, BILITOT, PROT, ALBUMIN in the last 168 hours. No results for input(s): LIPASE, AMYLASE in the last 168 hours. No results for input(s): AMMONIA in the last 168 hours.  CBC: Recent Labs  Lab 10/16/20 0439 10/18/20 0704  10/20/20 0434 10/21/20 0342  WBC 6.4 6.5 5.6 6.5  HGB 8.8* 8.6* 8.0* 8.8*  HCT 29.7* 27.6* 25.6* 27.4*  MCV 98.0 95.5 94.5 92.3  PLT 236 215 233 298    Cardiac Enzymes: No results for input(s): CKTOTAL, CKMB, CKMBINDEX, TROPONINI in the last 168 hours.  BNP (last 3 results) Recent Labs    09/04/20 1646 09/05/20 1007 10/05/20 1046  BNP 389.9* 458.9* 637.5*    ProBNP (last 3 results) No results for input(s): PROBNP in the last 8760 hours.  Radiological Exams: ECHOCARDIOGRAM COMPLETE  Result Date: 10/20/2020    ECHOCARDIOGRAM REPORT   Patient Name:   Eric Knapp Date of Exam: 10/20/2020 Medical Rec #:  13/01/2020  Height: Accession #:    [id number] Weight: Date of Birth:  04-02-1941 BSA: Patient Age:    78 years   BP:           92/39 mmHg Patient Gender: M          HR:           64 bpm. Exam Location:  Inpatient Procedure: 2D Echo, Color Doppler and Cardiac Doppler Indications:    R94.31 Abnormal EKG  History:        Patient has no prior history of Echocardiogram examinations.  CHF, CAD, COPD, Arrythmias:Atrial Fibrillation; Risk                 Factors:Renal failure.  Sonographer:    Irving Burton Senior RDCS Referring Phys: [phone] Terrall Bley A Lonna Rabold  Sonographer Comments: Very technically difficult study due to significant anasarca. Scanned supine on artificial respirator. IMPRESSIONS  1. Left ventricular ejection fraction, by estimation, is 35 to 40%. The left ventricle has moderately decreased function. The left ventricle demonstrates regional wall motion abnormalities (see scoring diagram/findings for description). There is mild concentric left ventricular hypertrophy. Left ventricular diastolic parameters are indeterminate. There is moderate hypokinesis of the left ventricular, basal-mid inferior wall.  2. Right ventricular systolic function is moderately reduced. The right ventricular size is mildly enlarged. There is moderately elevated pulmonary artery systolic pressure.  3. Left  atrial size was moderately dilated.  4. Right atrial size was mild to moderately dilated.  5. The mitral valve is degenerative. Moderate mitral valve regurgitation. No evidence of mitral stenosis.  6. Tricuspid valve regurgitation is moderate.  7. The aortic valve is tricuspid. There is mild calcification of the aortic valve. There is mild thickening of the aortic valve. Aortic valve regurgitation is not visualized. Mild aortic valve sclerosis is present, with no evidence of aortic valve stenosis.  8. The inferior vena cava is dilated in size with <50% respiratory variability, suggesting right atrial pressure of 15 mmHg. FINDINGS  Left Ventricle: Left ventricular ejection fraction, by estimation, is 35 to 40%. The left ventricle has moderately decreased function. The left ventricle demonstrates regional wall motion abnormalities. Moderate hypokinesis of the left ventricular, basal-mid inferior wall. The left ventricular internal cavity size was normal in size. There is mild concentric left ventricular hypertrophy. Left ventricular diastolic parameters are indeterminate.  LV Wall Scoring: The entire inferior wall, posterior wall, and apex are hypokinetic. The entire anterior wall, antero-lateral wall, entire septum, and apical lateral segment are normal. Right Ventricle: The right ventricular size is mildly enlarged. No increase in right ventricular wall thickness. Right ventricular systolic function is moderately reduced. There is moderately elevated pulmonary artery systolic pressure. The tricuspid regurgitant velocity is 3.07 m/s, and with an assumed right atrial pressure of 15 mmHg, the estimated right ventricular systolic pressure is 52.7 mmHg. Left Atrium: Left atrial size was moderately dilated. Right Atrium: Right atrial size was mild to moderately dilated. Pericardium: There is no evidence of pericardial effusion. Mitral Valve: The mitral valve is degenerative in appearance. There is mild thickening of the  mitral valve leaflet(s). There is mild calcification of the mitral valve leaflet(s). Mild mitral annular calcification. Moderate mitral valve regurgitation. No evidence of mitral valve stenosis. Tricuspid Valve: The tricuspid valve is normal in structure. Tricuspid valve regurgitation is moderate. Aortic Valve: The aortic valve is tricuspid. There is mild calcification of the aortic valve. There is mild thickening of the aortic valve. Aortic valve regurgitation is not visualized. Mild aortic valve sclerosis is present, with no evidence of aortic valve stenosis. Pulmonic Valve: The pulmonic valve was normal in structure. Pulmonic valve regurgitation is not visualized. Aorta: The aortic root is normal in size and structure. There is minimal (Grade I) atheroma plaque. Venous: The inferior vena cava is dilated in size with less than 50% respiratory variability, suggesting right atrial pressure of 15 mmHg. IAS/Shunts: The interatrial septum was not assessed.  LEFT VENTRICLE PLAX 2D LVIDd:         4.30 cm     Diastology LVIDs:  3.80 cm     LV e' medial:    5.55 cm/s LV PW:         1.30 cm     LV E/e' medial:  16.8 LV IVS:        1.10 cm     LV e' lateral:   8.49 cm/s LVOT diam:     2.10 cm     LV E/e' lateral: 11.0 LV SV:         43 LVOT Area:     3.46 cm  LV Volumes (MOD) LV vol d, MOD A2C: 90.0 ml LV vol d, MOD A4C: 85.2 ml LV vol s, MOD A2C: 50.4 ml LV vol s, MOD A4C: 59.4 ml LV SV MOD A2C:     39.6 ml LV SV MOD A4C:     85.2 ml LV SV MOD BP:      32.9 ml RIGHT VENTRICLE RV S prime:     6.64 cm/s TAPSE (M-mode): 1.3 cm LEFT ATRIUM           RIGHT ATRIUM LA diam:      5.00 cm RA Area:     18.40 cm LA Vol (A2C): 74.8 ml RA Volume:   48.90 ml LA Vol (A4C): 54.3 ml  AORTIC VALVE LVOT Vmax:   60.10 cm/s LVOT Vmean:  41.100 cm/s LVOT VTI:    0.125 m  AORTA Ao Root diam: 3.00 cm MITRAL VALVE               TRICUSPID VALVE MV Area (PHT): 5.38 cm    TR Peak grad:   37.7 mmHg MV Decel Time: 141 msec    TR Vmax:         307.00 cm/s MV E velocity: 93.40 cm/s MV A velocity: 29.10 cm/s  SHUNTS MV E/A ratio:  3.21        Systemic VTI:  0.12 m                            Systemic Diam: 2.10 cm Orpah Cobb MD Electronically signed by Orpah Cobb MD Signature Date/Time: 10/20/2020/3:05:56 PM    Final     Assessment/Plan Active Problems:   Acute on chronic respiratory failure with hypoxia (HCC)   Chronic combined systolic and diastolic heart failure (HCC)   COPD, severe (HCC)   Chronic atrial fibrillation (HCC)   Metabolic encephalopathy   Pleural effusion, right   1. Acute on chronic respiratory failure hypoxia we will continue with pressure support no weaning at this time. 2. Combined systolic diastolic heart failure we will continue with supportive care 3. Severe COPD at baseline 4. Chronic atrial fibrillation rate controlled 5. Metabolic encephalopathy no change 6. Pleural effusion we will continue to monitor prognosis overall is guarded.   I have personally seen and evaluated the patient, evaluated laboratory and imaging results, formulated the assessment and plan and placed orders. The Patient requires high complexity decision making with multiple systems involvement.  Rounds were done with the Respiratory Therapy Director and Staff therapists and discussed with nursing staff also.  Yevonne Pax, MD [facility] Pulmonary Critical Care Medicine Sleep Medicine

## 2020-10-23 DIAGNOSIS — J449 Chronic obstructive pulmonary disease, unspecified: Secondary | ICD-10-CM | POA: Diagnosis not present

## 2020-10-23 DIAGNOSIS — J9621 Acute and chronic respiratory failure with hypoxia: Secondary | ICD-10-CM | POA: Diagnosis not present

## 2020-10-23 DIAGNOSIS — I482 Chronic atrial fibrillation, unspecified: Secondary | ICD-10-CM | POA: Diagnosis not present

## 2020-10-23 DIAGNOSIS — I5042 Chronic combined systolic (congestive) and diastolic (congestive) heart failure: Secondary | ICD-10-CM | POA: Diagnosis not present

## 2020-10-23 LAB — BASIC METABOLIC PANEL
Anion gap: 12 (ref 5–15)
BUN: 133 mg/dL — ABNORMAL HIGH (ref 8–23)
CO2: 25 mmol/L (ref 22–32)
Calcium: 7.8 mg/dL — ABNORMAL LOW (ref 8.9–10.3)
Chloride: 88 mmol/L — ABNORMAL LOW (ref 98–111)
Creatinine, Ser: 3.47 mg/dL — ABNORMAL HIGH (ref 0.61–1.24)
GFR, Estimated: 18 mL/min — ABNORMAL LOW (ref >60)
Glucose, Bld: 91 mg/dL (ref 70–99)
Potassium: 4.6 mmol/L (ref 3.5–5.1)
Sodium: 125 mmol/L — ABNORMAL LOW (ref 135–145)

## 2020-10-23 NOTE — Progress Notes (Signed)
Pulmonary Critical Care Medicine [facility] [place]   PULMONARY CRITICAL CARE SERVICE  PROGRESS NOTE  Date of Service: 10/23/2020  Eric Knapp  HEN:[id number]  DOB: 1941/03/09   DOA: 08/29/2020  Referring Physician: Carron Curie, MD  HPI: Eric Knapp is a 80 y.o. male seen for follow up of Acute on Chronic Respiratory Failure. Patient remains on pressure support has been on 30% FiO2 currently is on 12/5  Medications: Reviewed on Rounds  Physical Exam:  Vitals: Temperature is 97.4 pulse 77 respiratory rate 22 blood pressure is 129/69 saturations 96%  Ventilator Settings on pressure support FiO2 30% tidal volume 650 pressure 12/5  . General: Comfortable at this time . Eyes: Grossly normal lids, irises & conjunctiva . ENT: grossly tongue is normal . Neck: no obvious mass . Cardiovascular: S1 S2 normal no gallop . Respiratory: No rhonchi no rales are noted at this time . Abdomen: soft . Skin: no rash seen on limited exam . Musculoskeletal: not rigid . Psychiatric:unable to assess . Neurologic: no seizure no involuntary movements         Lab Data:   Basic Metabolic Panel: Recent Labs  Lab 10/18/20 0704 10/19/20 0624 10/20/20 0434 10/21/20 0342 10/23/20 0252  NA 131* 130* 125* 123* 125*  K 4.7 4.4 4.6 4.5 4.6  CL 90* 89* 85* 83* 88*  CO2 28 27 30 26 25   GLUCOSE 103* 143* 217* 53* 91  BUN 123* 122* 121* 134* 133*  CREATININE 1.75* 1.76* 2.04* 2.42* 3.47*  CALCIUM 8.4* 8.6* 8.2* 8.1* 7.8*  MG  --  2.1  --  2.3  --   PHOS  --  4.6  --   --   --     ABG: Recent Labs  Lab 10/19/20 0915  PHART 7.343*  PCO2ART 61.6*  PO2ART 85.2  HCO3 32.5*  O2SAT 96.6    Liver Function Tests: No results for input(s): AST, ALT, ALKPHOS, BILITOT, PROT, ALBUMIN in the last 168 hours. No results for input(s): LIPASE, AMYLASE in the last 168 hours. No results for input(s): AMMONIA in the last 168 hours.  CBC: Recent Labs  Lab 10/18/20 0704 10/20/20 0434  10/21/20 0342  WBC 6.5 5.6 6.5  HGB 8.6* 8.0* 8.8*  HCT 27.6* 25.6* 27.4*  MCV 95.5 94.5 92.3  PLT 215 233 298    Cardiac Enzymes: No results for input(s): CKTOTAL, CKMB, CKMBINDEX, TROPONINI in the last 168 hours.  BNP (last 3 results) Recent Labs    09/04/20 1646 09/05/20 1007 10/05/20 1046  BNP 389.9* 458.9* 637.5*    ProBNP (last 3 results) No results for input(s): PROBNP in the last 8760 hours.  Radiological Exams: No results found.  Assessment/Plan Active Problems:   Acute on chronic respiratory failure with hypoxia (HCC)   Chronic combined systolic and diastolic heart failure (HCC)   COPD, severe (HCC)   Chronic atrial fibrillation (HCC)   Metabolic encephalopathy   Pleural effusion, right   1. Acute on chronic respiratory failure with hypoxia patient did not tolerate full support overnight was placed back on pressure support which patient appears to tolerate better 2. Chronic combined systolic and diastolic heart failure compensated continue with supportive care 3. COPD with present management. 4. Chronic atrial fibrillation rate is controlled 5. Metabolic encephalopathy no change 6. Effusion supportive care   I have personally seen and evaluated the patient, evaluated laboratory and imaging results, formulated the assessment and plan and placed orders. The Patient requires high complexity decision making with multiple systems  involvement.  Rounds were done with the Respiratory Therapy Director and Staff therapists and discussed with nursing staff also.  Allyne Gee, MD [facility] Pulmonary Critical Care Medicine Sleep Medicine

## 2020-11-18 DEATH — deceased

## 2021-04-02 IMAGING — CT CT CHEST W/O CM
2 of 4 series · 16 of 46 positions shown, 18 images · non-contrast
Comparison: 09/05/2020

CLINICAL DATA: Pleural effusion suspected

EXAM:
CT CHEST, ABDOMEN AND PELVIS WITHOUT CONTRAST
TECHNIQUE: Multidetector CT imaging of the chest, abdomen and pelvis was
performed following the standard protocol without IV contrast.

[Series 3: cap without · axial · non-contrast · 0.98mm/px · z∈[+754,+1314]mm · 13 of 132 slices shown, 15 images]
[im 10/132  soft-tissue]
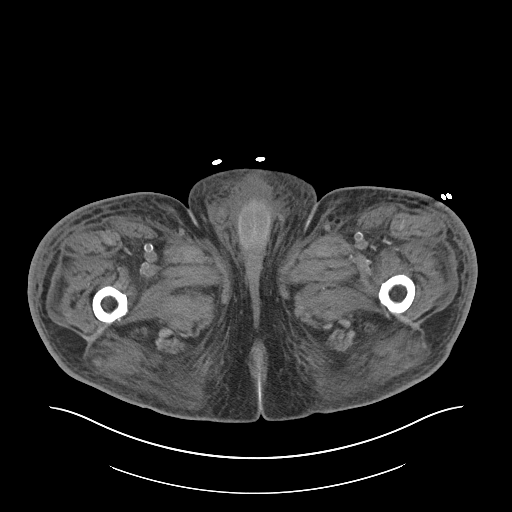
[im 10/132  bone]
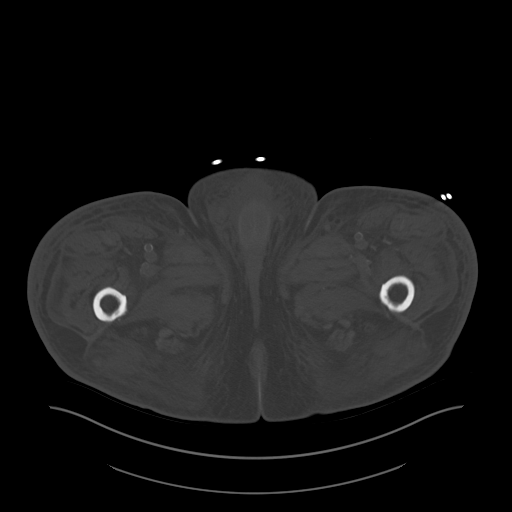
[im 19/132  soft-tissue]
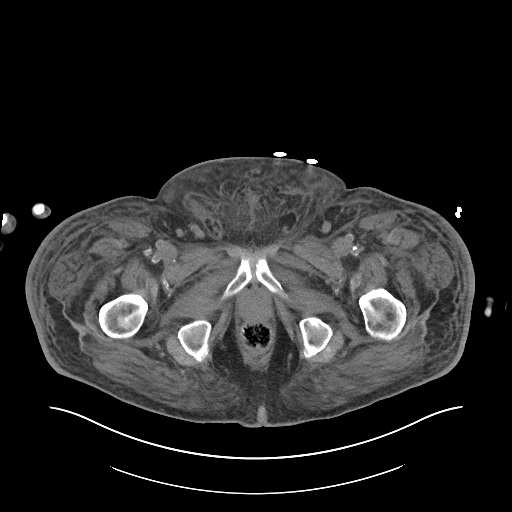
[im 29/132  soft-tissue]
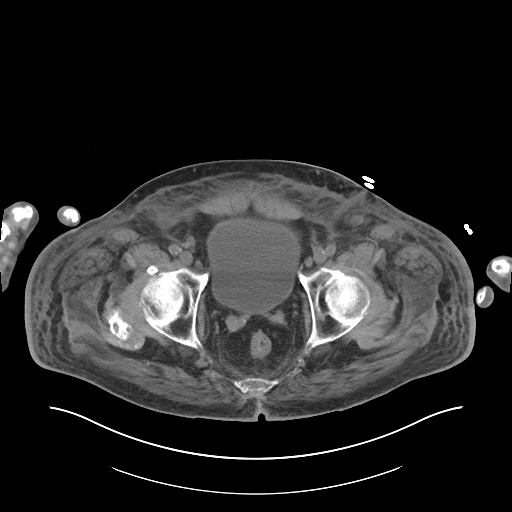
[im 38/132  soft-tissue]
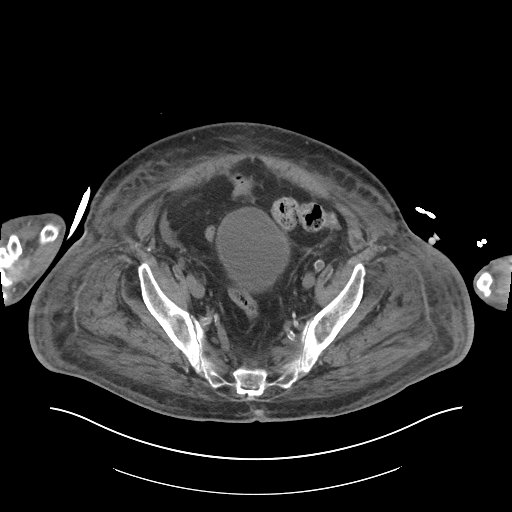
[im 47/132  soft-tissue]
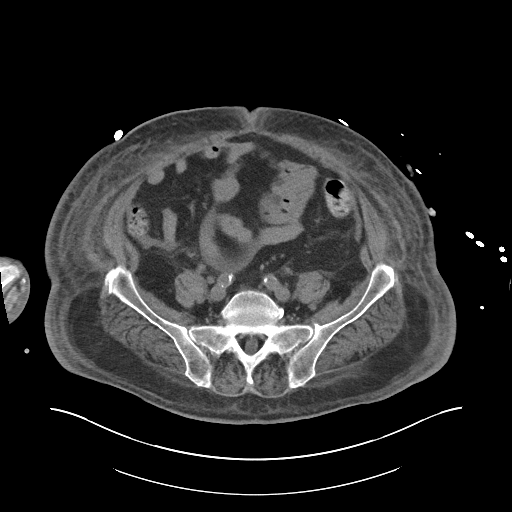
[im 57/132  soft-tissue]
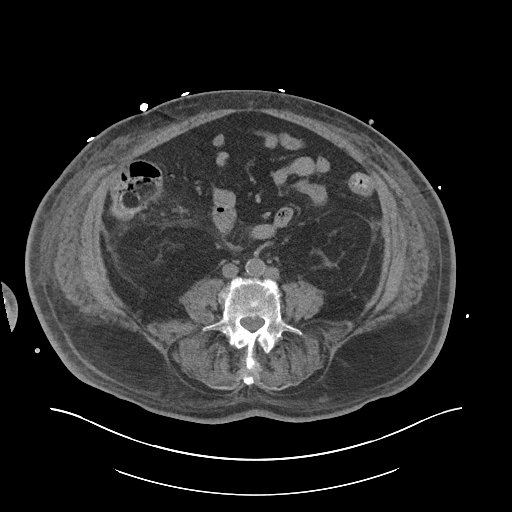
[im 66/132  soft-tissue]
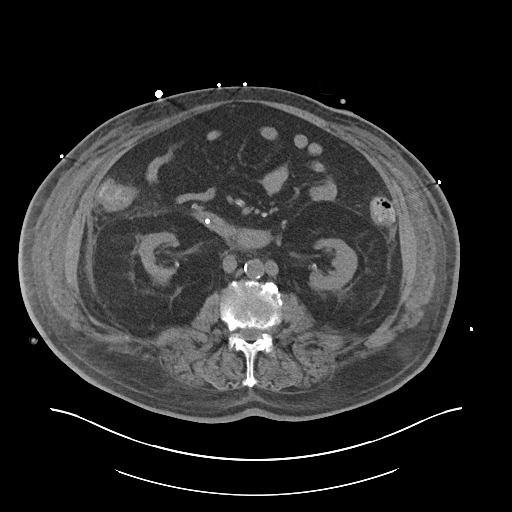
[im 75/132  soft-tissue]
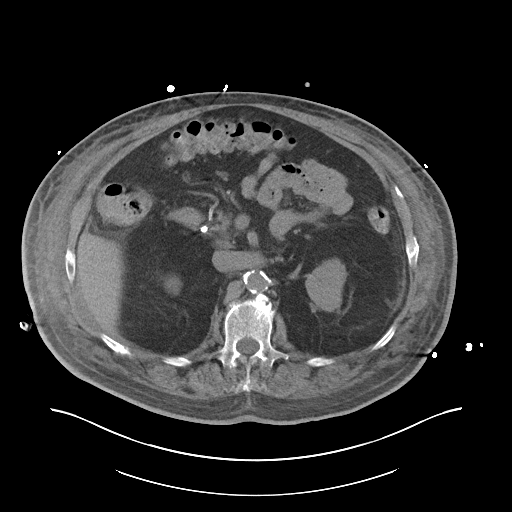
[im 85/132  soft-tissue]
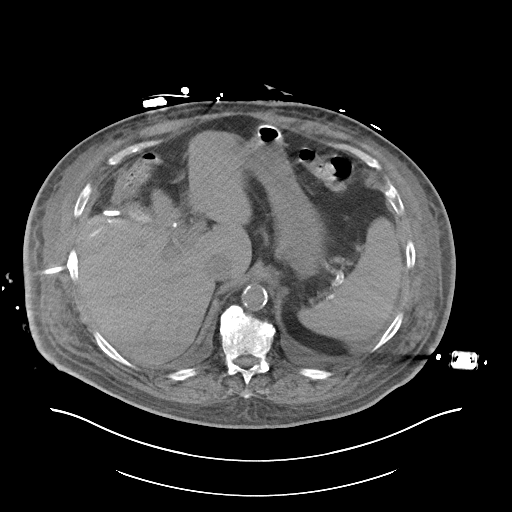
[im 85/132  bone]
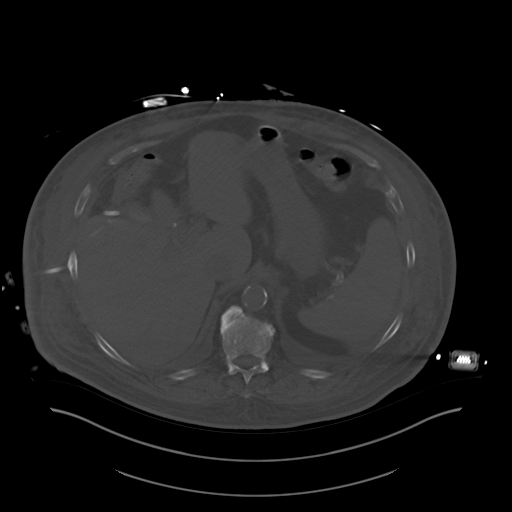
[im 94/132  soft-tissue]
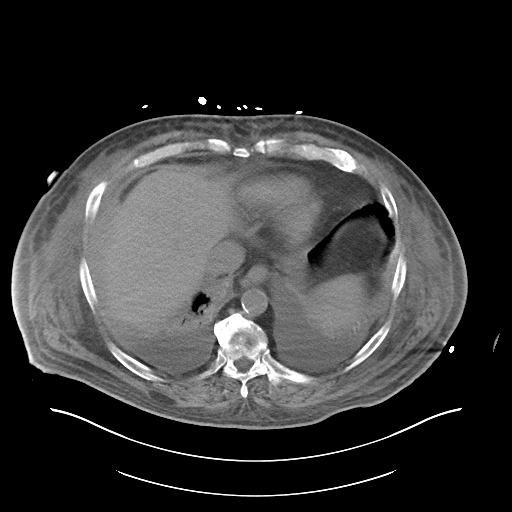
[im 103/132  soft-tissue]
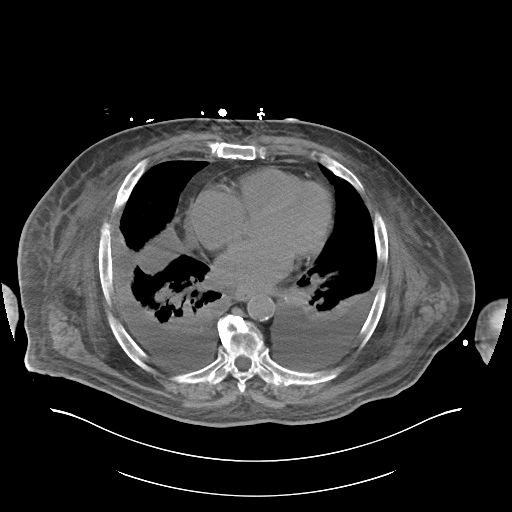
[im 113/132  soft-tissue]
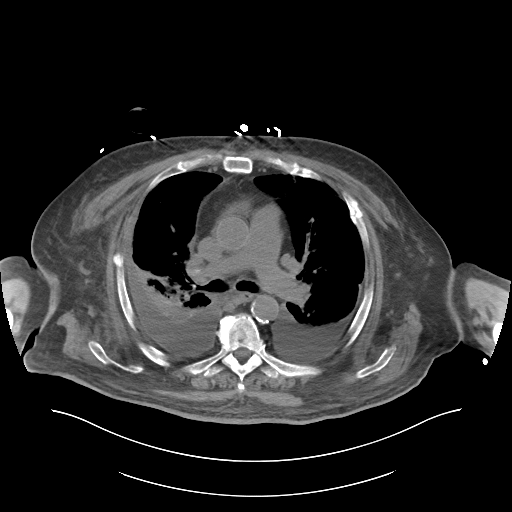
[im 122/132  soft-tissue]
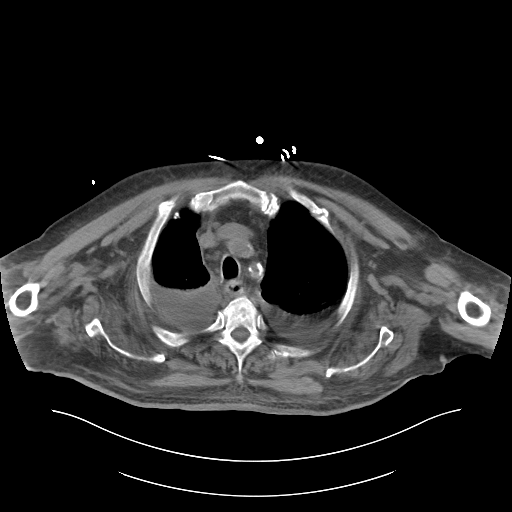

[Series 6: cor · coronal · 0.99mm/px · 3 of 114 slices shown]
[im 38/114  soft-tissue]
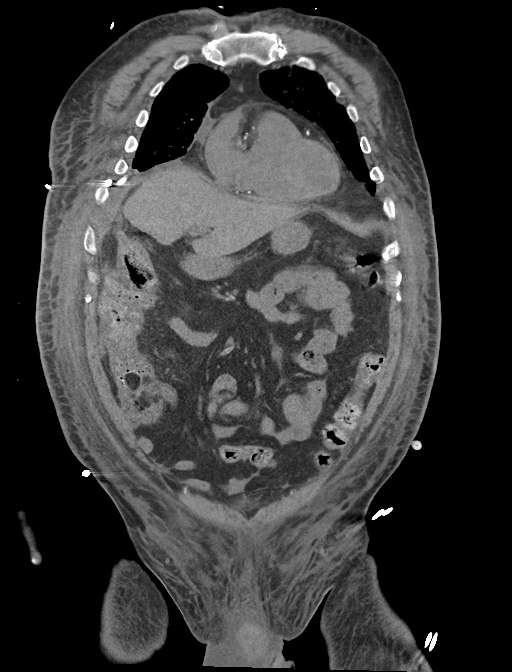
[im 51/114  soft-tissue]
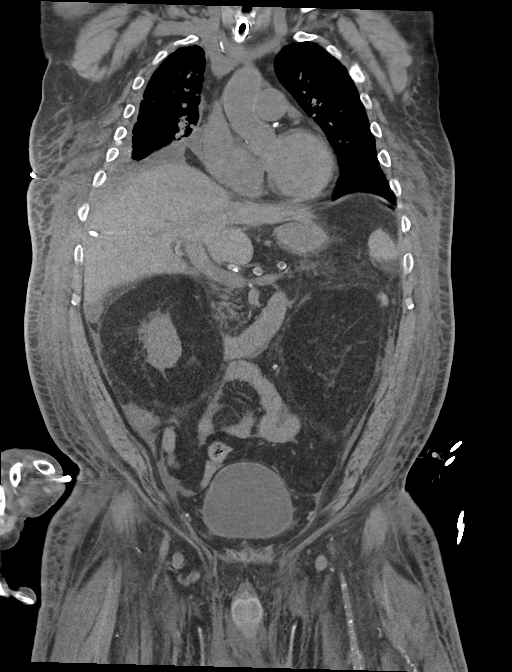
[im 63/114  soft-tissue]
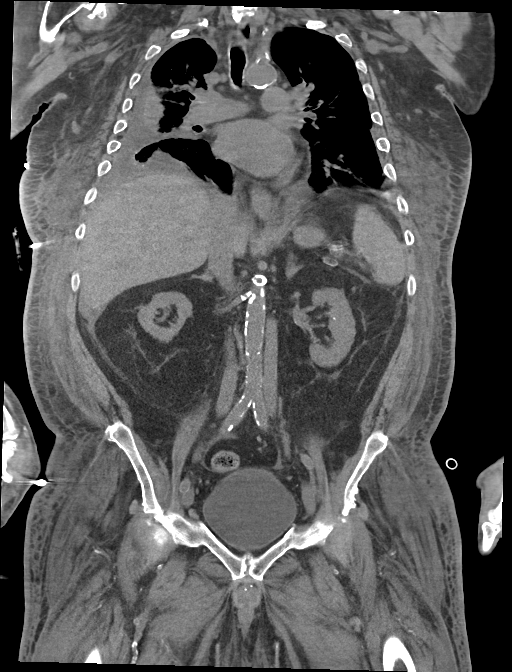

[16 of 46 positions shown; findings below may reference images not displayed]

FINDINGS: CT CHEST FINDINGS

Cardiovascular: Normal heart size. Low-density blood pool. Extensive
coronary and aortic atherosclerotic calcification.

Mediastinum/Nodes: No pneumomediastinum or worrisome lymph nodes.

Lungs/Pleura: Moderate layering pleural effusion on both sides,
diminished on the right and new on the left. Dependent atelectasis.
There is superimposed pleural thickening on the right. Mild
inflammatory appearing patchy pulmonary opacity. There is a
right-sided pleural catheter in place the lateral costophrenic
sulcus.

Musculoskeletal: Body wall edema. Healing bilateral rib fractures
with callus. Severe glenohumeral osteoarthritis on the right.
Spondylosis with multi-level bridging osteophyte.

CT ABDOMEN PELVIS FINDINGS

Hepatobiliary: No focal liver abnormality.Percutaneous
cholecystostomy tube with decompressed gallbladder which likely
contains calculi. Plastic biliary stent is in expected position.
Pneumobilia.

Pancreas: Generalized atrophy.

Spleen: Negative

Adrenals/Urinary Tract: Negative adrenals. No hydronephrosis or
stone. Symmetric renal atrophy. Unremarkable bladder.

Stomach/Bowel: No obstruction. No visible bowel inflammation.
Percutaneous gastrostomy tube in expected position.

Vascular/Lymphatic: Diffuse atherosclerotic calcification. No mass
or adenopathy.

Reproductive:Negative

Other: Retroperitoneal and body wall edema. Trace ascites below the
right lobe liver, stable.

Musculoskeletal: No acute abnormalities. Advanced disc and facet
degeneration. Generalized osteopenia.
IMPRESSION: 1. Moderate bilateral pleural effusion with pleural thickening on
the right. The right effusion is decreased from 09/05/2020 in the
left effusion is new. Right pleural catheter in place.
2. Multifocal atelectasis.
3. Stable percutaneous cholecystostomy tube positioning with
decompressed gallbladder. Trace perihepatic ascites that is stable.
4. Anasarca

## 2021-04-11 IMAGING — DX DG CHEST 1V PORT
1 series · 1 of 1 positions shown · non-contrast
Comparison: 09/29/2020

CLINICAL DATA: Pleural effusion

EXAM:
PORTABLE CHEST 1 VIEW

[chest ap]
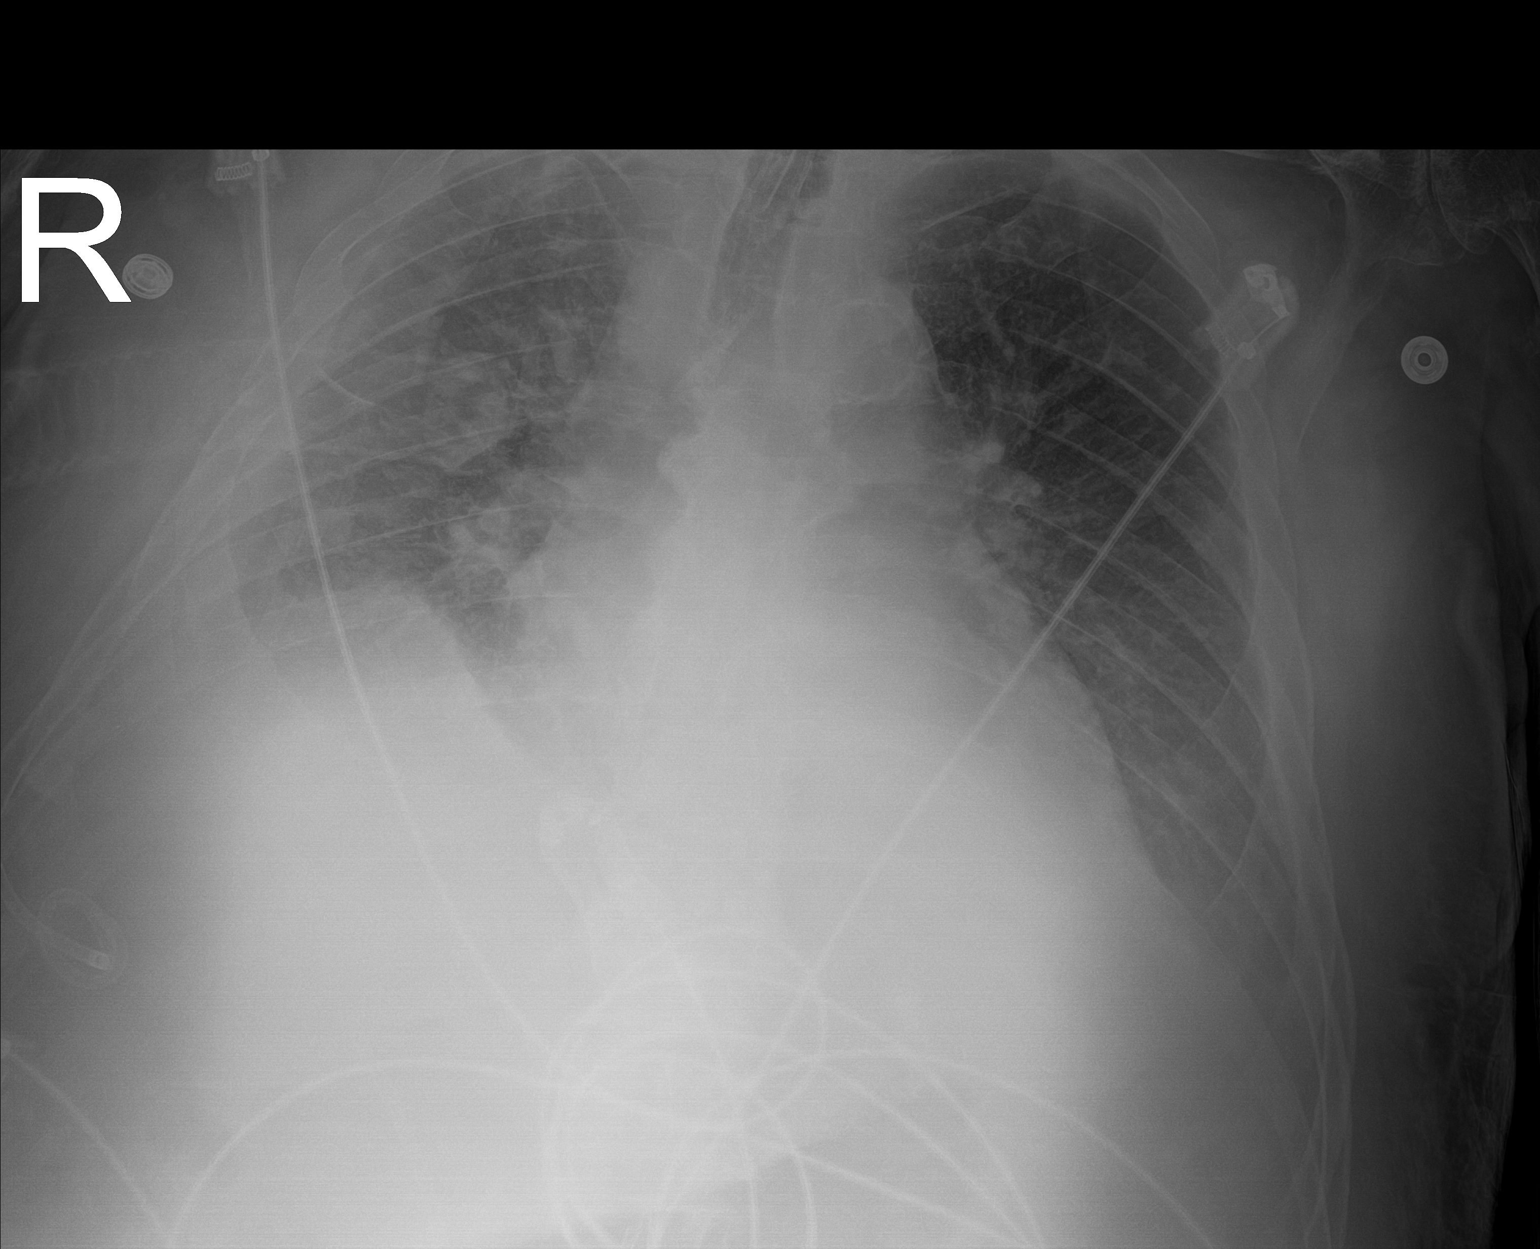

[1 of 1 positions shown; findings below may reference images not displayed]

FINDINGS: Layering moderate right and small left pleural effusions. Mild right
basilar opacities, likely atelectasis. When compared to the prior,
the left pleural effusion is improved. No pneumothorax.

Tracheostomy in satisfactory position.

Mild cardiomegaly.
IMPRESSION: Layering moderate right and small left pleural effusions, decreased
on the left.

Mild right basilar opacities, likely atelectasis.

## 2021-04-12 IMAGING — DX DG CHEST 1V PORT
1 series · 1 of 1 positions shown · non-contrast
Comparison: Chest radiograph dated 10/04/2020.

CLINICAL DATA: 78-year-old male with respiratory failure.

EXAM:
PORTABLE CHEST 1 VIEW

[chest ap]
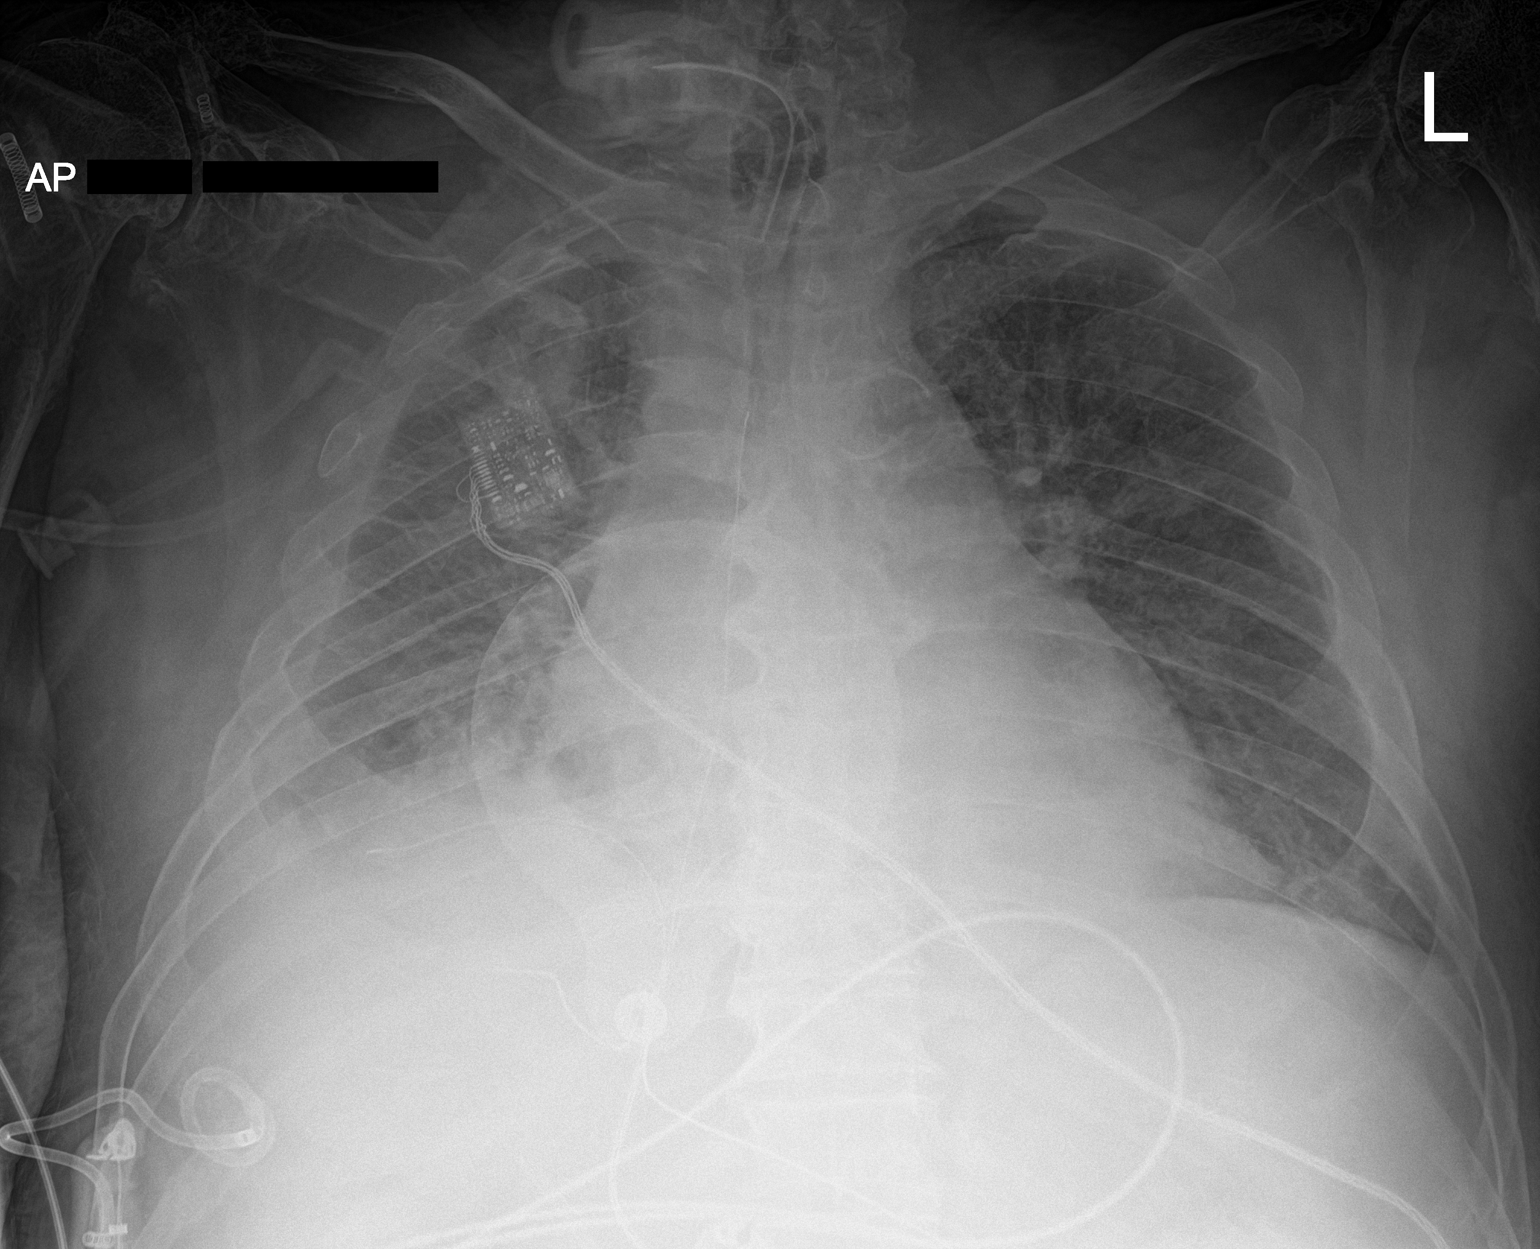

[1 of 1 positions shown; findings below may reference images not displayed]

FINDINGS: Tracheostomy above the carina in similar position. There is mild
cardiomegaly with vascular congestion and edema. Small right pleural
effusion similar to prior radiograph. No pneumothorax. No acute
osseous pathology.
IMPRESSION: Cardiomegaly with findings of CHF and small right pleural effusion.
No significant interval change.

## 2021-04-20 IMAGING — DX DG CHEST 1V PORT
1 series · 1 of 1 positions shown · non-contrast
Comparison: October 05, 2020.

CLINICAL DATA: Shortness of breath

EXAM:
PORTABLE CHEST 1 VIEW

[chest ap]
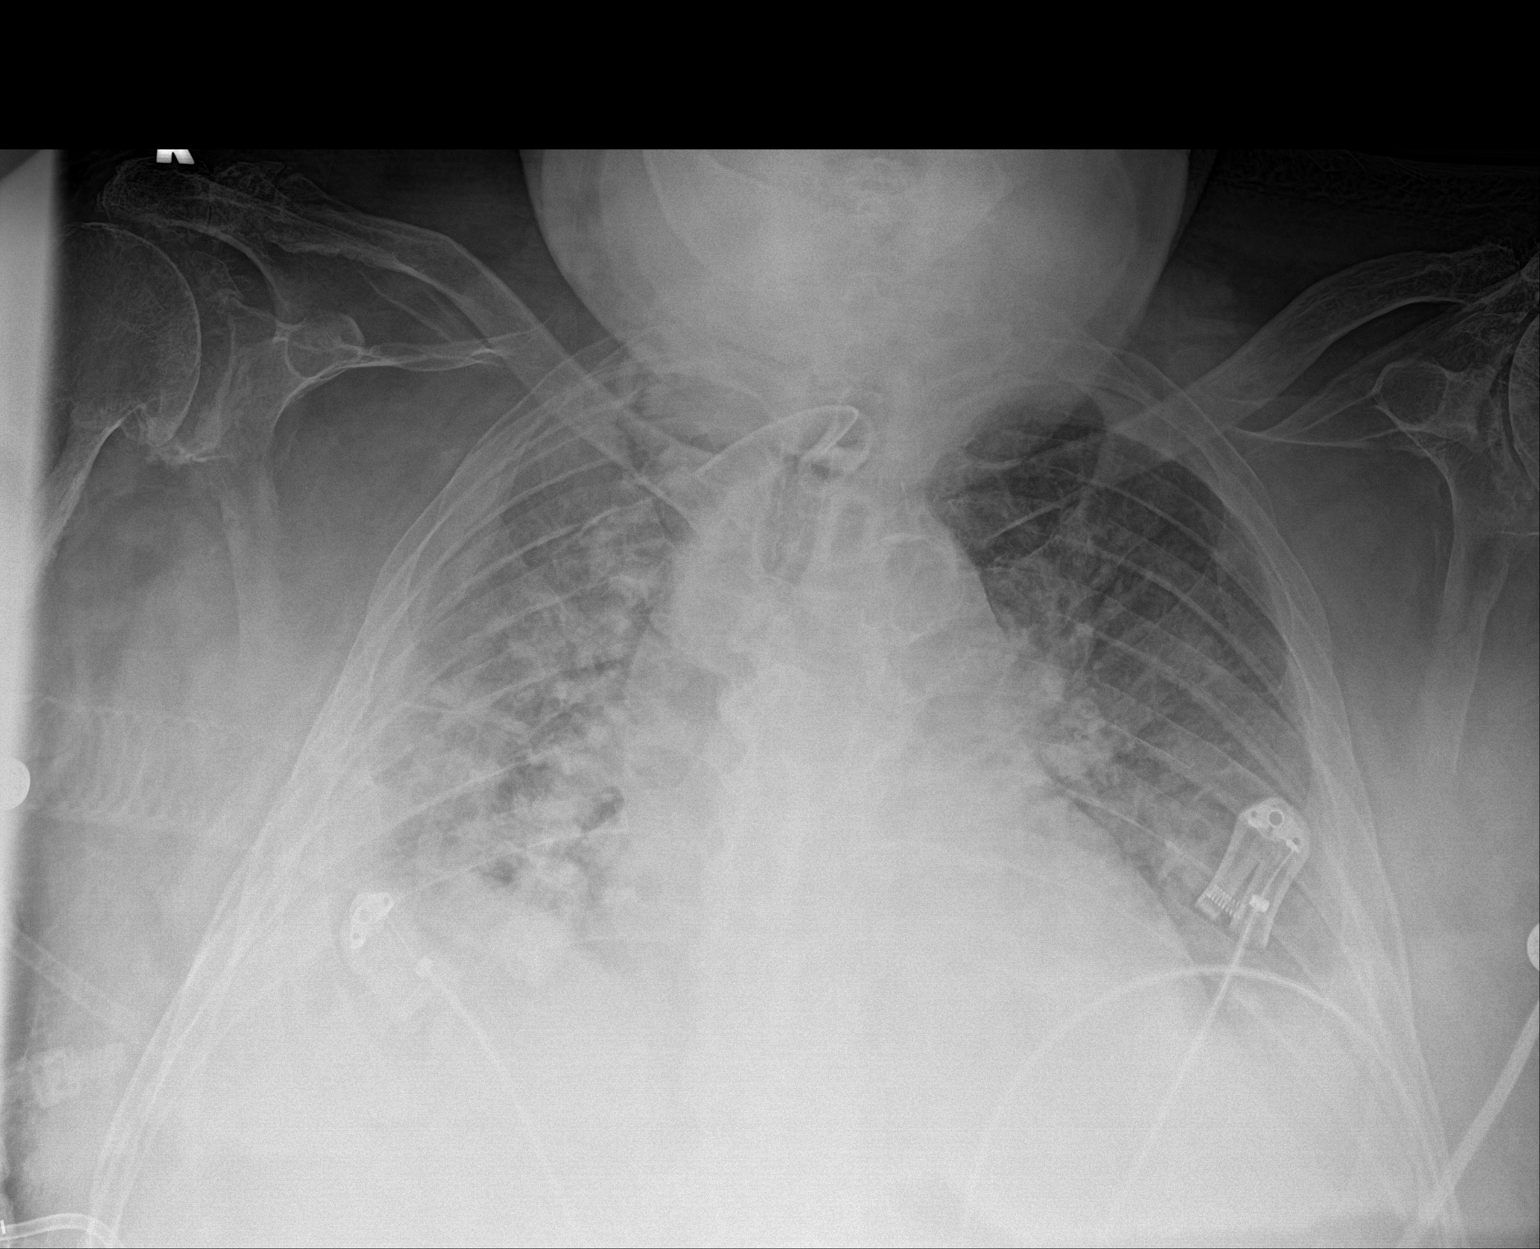

[1 of 1 positions shown; findings below may reference images not displayed]

FINDINGS: Tracheostomy catheter tip is 4.1 cm above the carina. No
pneumothorax. There is mild cardiomegaly with pulmonary venous
hypertension. There are small pleural effusions bilaterally with
diffuse interstitial edema. Ill-defined patchy airspace opacity is
also noted bilaterally. There is aortic atherosclerosis. No
adenopathy. There is degenerative change in each shoulder.
IMPRESSION: Tracheostomy as described without pneumothorax. Mild cardiomegaly
with pulmonary vascular congestion. Small pleural effusions with
evidence of pulmonary edema. Overall appearance is consistent with
congestive heart failure. A degree of superimposed pneumonia
bilaterally cannot be entirely excluded, although all of the
findings may be accounted for by pulmonary edema.

Aortic Atherosclerosis ([id number]).

## 2021-04-23 IMAGING — DX DG ABDOMEN 1V
1 series · 1 of 1 positions shown · non-contrast
Comparison: 09/13/2020

CLINICAL DATA: Ileus.  Distention.  Question of fluid/edema.

EXAM:
ABDOMEN - 1 VIEW

[abdomen kub]
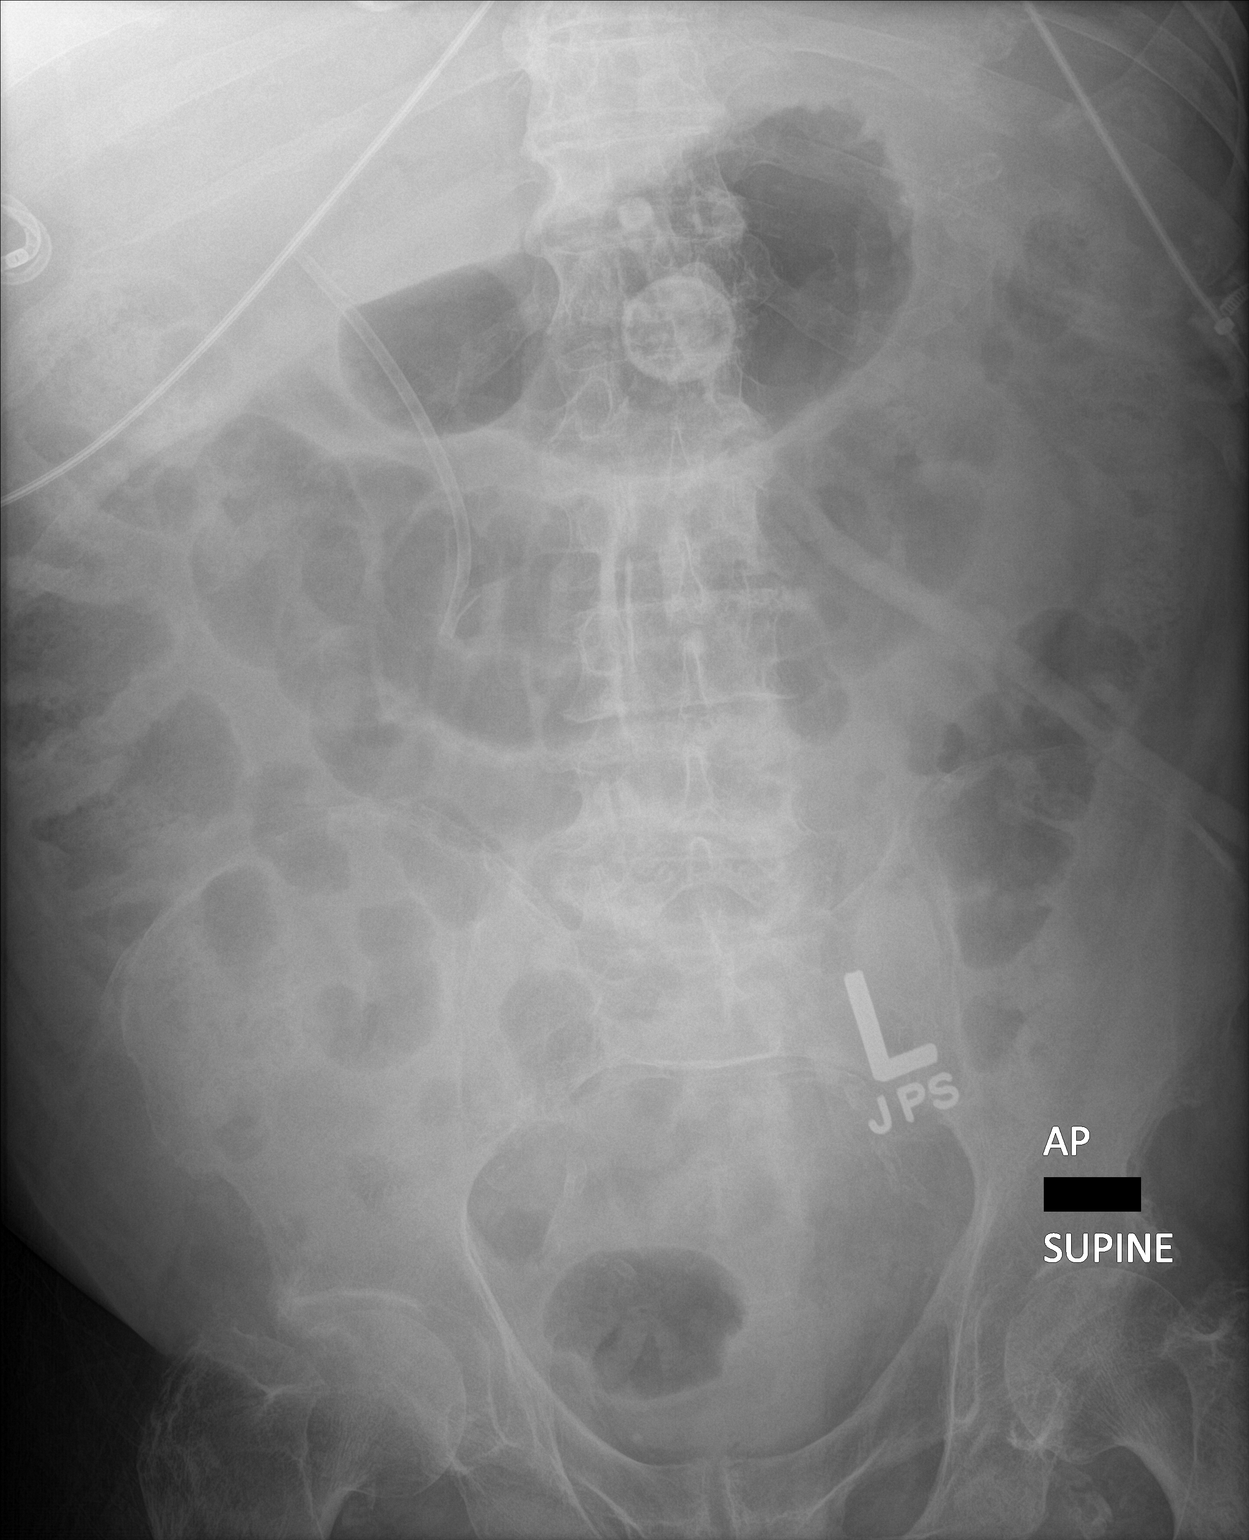

[1 of 1 positions shown; findings below may reference images not displayed]

FINDINGS: Biliary stent overlies the RIGHT UPPER QUADRANT, unchanged in
appearance. Bowel gas pattern is nonobstructive. Gastrostomy tube
overlies the stomach. No evidence for organomegaly. Degenerative
changes are seen in the spine and hips.
IMPRESSION: Nonobstructive bowel gas pattern.
